# Patient Record
Sex: Male | Born: 1940 | Race: Black or African American | Hispanic: No | Marital: Married | Smoking: Former smoker
Health system: Southern US, Community
[De-identification: ages and names within clinical notes are randomized; demographics above are authoritative.]

## PROBLEM LIST (undated history)

## (undated) DIAGNOSIS — R590 Localized enlarged lymph nodes: Secondary | ICD-10-CM

## (undated) DIAGNOSIS — E785 Hyperlipidemia, unspecified: Secondary | ICD-10-CM

## (undated) DIAGNOSIS — D472 Monoclonal gammopathy: Secondary | ICD-10-CM

## (undated) DIAGNOSIS — M545 Low back pain, unspecified: Secondary | ICD-10-CM

## (undated) DIAGNOSIS — Z973 Presence of spectacles and contact lenses: Secondary | ICD-10-CM

## (undated) DIAGNOSIS — I10 Essential (primary) hypertension: Secondary | ICD-10-CM

## (undated) DIAGNOSIS — D649 Anemia, unspecified: Secondary | ICD-10-CM

## (undated) DIAGNOSIS — K5520 Angiodysplasia of colon without hemorrhage: Secondary | ICD-10-CM

## (undated) DIAGNOSIS — K219 Gastro-esophageal reflux disease without esophagitis: Secondary | ICD-10-CM

## (undated) DIAGNOSIS — M8700 Idiopathic aseptic necrosis of unspecified bone: Secondary | ICD-10-CM

## (undated) DIAGNOSIS — D573 Sickle-cell trait: Secondary | ICD-10-CM

## (undated) DIAGNOSIS — Z972 Presence of dental prosthetic device (complete) (partial): Secondary | ICD-10-CM

## (undated) DIAGNOSIS — R9389 Abnormal findings on diagnostic imaging of other specified body structures: Secondary | ICD-10-CM

## (undated) HISTORY — PX: COLONOSCOPY: SHX174

## (undated) HISTORY — DX: Anemia, unspecified: D64.9

## (undated) HISTORY — DX: Hyperlipidemia, unspecified: E78.5

## (undated) HISTORY — PX: HERNIA REPAIR: SHX51

## (undated) HISTORY — PX: APPENDECTOMY: SHX54

## (undated) HISTORY — DX: Monoclonal gammopathy: D47.2

## (undated) HISTORY — DX: Gastro-esophageal reflux disease without esophagitis: K21.9

## (undated) HISTORY — DX: Essential (primary) hypertension: I10

## (undated) HISTORY — DX: Localized enlarged lymph nodes: R59.0

---

## 2008-06-21 ENCOUNTER — Encounter: Payer: Self-pay | Admitting: Gastroenterology

## 2008-10-21 ENCOUNTER — Encounter: Payer: Self-pay | Admitting: Gastroenterology

## 2008-10-25 ENCOUNTER — Ambulatory Visit: Payer: Self-pay | Admitting: Gastroenterology

## 2008-10-25 DIAGNOSIS — D509 Iron deficiency anemia, unspecified: Secondary | ICD-10-CM

## 2008-10-26 ENCOUNTER — Ambulatory Visit: Payer: Self-pay | Admitting: Gastroenterology

## 2008-10-28 ENCOUNTER — Ambulatory Visit: Payer: Self-pay | Admitting: Gastroenterology

## 2008-11-04 ENCOUNTER — Encounter: Payer: Self-pay | Admitting: Gastroenterology

## 2008-11-04 ENCOUNTER — Ambulatory Visit: Payer: Self-pay | Admitting: Gastroenterology

## 2008-11-04 ENCOUNTER — Telehealth: Payer: Self-pay | Admitting: Gastroenterology

## 2008-11-05 ENCOUNTER — Telehealth: Payer: Self-pay | Admitting: Gastroenterology

## 2008-11-05 ENCOUNTER — Observation Stay (HOSPITAL_COMMUNITY): Admission: AD | Admit: 2008-11-05 | Discharge: 2008-11-05 | Payer: Self-pay | Admitting: Gastroenterology

## 2008-11-05 ENCOUNTER — Ambulatory Visit: Payer: Self-pay | Admitting: Hematology and Oncology

## 2008-11-05 LAB — CONVERTED CEMR LAB
HCT: 16.9 % — CL (ref 39.0–52.0)
Hemoglobin: 5 g/dL — CL (ref 13.0–17.0)
MCHC: 29.3 g/dL — ABNORMAL LOW (ref 30.0–36.0)
MCV: 61.1 fL — ABNORMAL LOW (ref 78.0–100.0)
Platelets: 323 10*3/uL (ref 150–400)
RBC: 2.77 M/uL — ABNORMAL LOW (ref 4.22–5.81)
RDW: 19.3 % — ABNORMAL HIGH (ref 11.5–14.6)
WBC: 3.7 10*3/uL — ABNORMAL LOW (ref 4.5–10.5)

## 2008-11-08 ENCOUNTER — Ambulatory Visit: Payer: Self-pay | Admitting: Gastroenterology

## 2008-11-08 LAB — CONVERTED CEMR LAB
Basophils Absolute: 0 10*3/uL (ref 0.0–0.1)
Basophils Relative: 0 % (ref 0.0–3.0)
Eosinophils Absolute: 0.2 10*3/uL (ref 0.0–0.7)
Eosinophils Relative: 3.5 % (ref 0.0–5.0)
HCT: 27.8 % — ABNORMAL LOW (ref 39.0–52.0)
Hemoglobin: 8.6 g/dL — ABNORMAL LOW (ref 13.0–17.0)
Lymphocytes Relative: 13 % (ref 12.0–46.0)
MCHC: 31.9 g/dL (ref 30.0–36.0)
MCV: 69.3 fL — ABNORMAL LOW (ref 78.0–100.0)
Monocytes Absolute: 0.8 10*3/uL (ref 0.1–1.0)
Monocytes Relative: 9.8 % (ref 3.0–12.0)
Neutro Abs: 3.8 10*3/uL (ref 1.4–7.7)
Neutrophils Relative %: 73.7 % (ref 43.0–77.0)
Platelets: 296 10*3/uL (ref 150–400)
RBC: 4.02 M/uL — ABNORMAL LOW (ref 4.22–5.81)
RDW: 30.6 % — ABNORMAL HIGH (ref 11.5–14.6)
WBC: 5.2 10*3/uL (ref 4.5–10.5)

## 2008-11-09 ENCOUNTER — Ambulatory Visit: Payer: Self-pay | Admitting: Gastroenterology

## 2008-11-09 LAB — CONVERTED CEMR LAB
Fecal Occult Blood: NEGATIVE
OCCULT 1: NEGATIVE
OCCULT 2: NEGATIVE
OCCULT 3: NEGATIVE
OCCULT 4: NEGATIVE
OCCULT 5: NEGATIVE

## 2008-11-10 ENCOUNTER — Telehealth: Payer: Self-pay | Admitting: Gastroenterology

## 2008-11-11 ENCOUNTER — Encounter: Payer: Self-pay | Admitting: Gastroenterology

## 2008-11-11 LAB — URINALYSIS, MICROSCOPIC - CHCC
Bilirubin (Urine): NEGATIVE
Blood: NEGATIVE
Ketones: NEGATIVE mg/dL
Specific Gravity, Urine: 1.015 (ref 1.003–1.035)
pH: 6 (ref 4.6–8.0)

## 2008-11-11 LAB — MANUAL DIFFERENTIAL
ALC: 0.7 10*3/uL — ABNORMAL LOW (ref 0.9–3.3)
Basophil: 1 % (ref 0–2)
EOS: 5 % (ref 0–7)
LYMPH: 14 % (ref 14–49)
MONO: 8 % (ref 0–14)

## 2008-11-11 LAB — CBC & DIFF AND RETIC
HGB: 8.5 g/dL — ABNORMAL LOW (ref 13.0–17.1)
IRF: 0.34 (ref 0.070–0.380)
Platelets: 246 10*3/uL (ref 140–400)
RBC: 4 10*6/uL — ABNORMAL LOW (ref 4.20–5.82)
RDW: 30.5 % — ABNORMAL HIGH (ref 11.0–14.6)
RETIC #: 72.4 10*3/uL (ref 31.8–103.9)
WBC: 5 10*3/uL (ref 4.0–10.3)

## 2008-11-15 LAB — IRON AND TIBC
Iron: <10 ug/dL — ABNORMAL LOW (ref 42–165)
UIBC: 429 ug/dL

## 2008-11-15 LAB — COMPREHENSIVE METABOLIC PANEL
BUN: 9 mg/dL (ref 6–23)
CO2: 22 mEq/L (ref 19–32)
Glucose, Bld: 80 mg/dL (ref 70–99)
Sodium: 141 mEq/L (ref 135–145)
Total Bilirubin: 0.6 mg/dL (ref 0.3–1.2)
Total Protein: 7.3 g/dL (ref 6.0–8.3)

## 2008-11-15 LAB — HEMOGLOBINOPATHY EVALUATION
Hgb A2 Quant: 2.7 % (ref 2.2–3.2)
Hgb F Quant: 0 % (ref 0.0–2.0)

## 2008-11-15 LAB — FERRITIN: Ferritin: 10 ng/mL — ABNORMAL LOW (ref 22–322)

## 2008-11-15 LAB — GLUCOSE 6 PHOSPHATE DEHYDROGENASE: G-6-PD, Quant: 27 U/g{Hb} — ABNORMAL HIGH (ref 7–20)

## 2008-11-15 LAB — PROTEIN ELECTROPHORESIS, SERUM, WITH REFLEX
Beta 2: 5.4 % (ref 3.2–6.5)
Beta Globulin: 7.3 % — ABNORMAL HIGH (ref 4.7–7.2)

## 2008-11-15 LAB — FECAL OCCULT BLOOD, GUAIAC: Occult Blood: NEGATIVE

## 2008-11-15 LAB — HAPTOGLOBIN: Haptoglobin: 120 mg/dL (ref 16–200)

## 2008-11-17 ENCOUNTER — Encounter (INDEPENDENT_AMBULATORY_CARE_PROVIDER_SITE_OTHER): Payer: Self-pay | Admitting: *Deleted

## 2008-11-17 ENCOUNTER — Encounter (INDEPENDENT_AMBULATORY_CARE_PROVIDER_SITE_OTHER): Payer: Self-pay | Admitting: Interventional Radiology

## 2008-11-17 ENCOUNTER — Ambulatory Visit (HOSPITAL_COMMUNITY): Admission: RE | Admit: 2008-11-17 | Discharge: 2008-11-17 | Payer: Self-pay | Admitting: Hematology and Oncology

## 2008-11-19 LAB — CBC WITH DIFFERENTIAL/PLATELET
BASO%: 0.7 % (ref 0.0–2.0)
EOS%: 3 % (ref 0.0–7.0)
HCT: 28.3 % — ABNORMAL LOW (ref 38.4–49.9)
MCH: 19.2 pg — ABNORMAL LOW (ref 27.2–33.4)
MCHC: 29.7 g/dL — ABNORMAL LOW (ref 32.0–36.0)
MONO#: 0.7 10*3/uL (ref 0.1–0.9)
RBC: 4.38 10*6/uL (ref 4.20–5.82)
RDW: 25.5 % — ABNORMAL HIGH (ref 11.0–14.6)
WBC: 6 10*3/uL (ref 4.0–10.3)
lymph#: 1 10*3/uL (ref 0.9–3.3)
nRBC: 0 % (ref 0–0)

## 2008-11-26 ENCOUNTER — Telehealth: Payer: Self-pay | Admitting: Gastroenterology

## 2008-11-26 ENCOUNTER — Encounter: Payer: Self-pay | Admitting: Gastroenterology

## 2008-11-26 LAB — CBC WITH DIFFERENTIAL/PLATELET
Basophils Absolute: 0 10*3/uL (ref 0.0–0.1)
Eosinophils Absolute: 0.2 10*3/uL (ref 0.0–0.5)
HGB: 10 g/dL — ABNORMAL LOW (ref 13.0–17.1)
MCV: 70.7 fL — ABNORMAL LOW (ref 79.3–98.0)
MONO%: 13.8 % (ref 0.0–14.0)
NEUT#: 4.5 10*3/uL (ref 1.5–6.5)
RBC: 4.71 10*6/uL (ref 4.20–5.82)
RDW: 32 % — ABNORMAL HIGH (ref 11.0–14.6)
WBC: 6.7 10*3/uL (ref 4.0–10.3)
lymph#: 1.1 10*3/uL (ref 0.9–3.3)
nRBC: 0 % (ref 0–0)

## 2008-12-01 ENCOUNTER — Ambulatory Visit: Payer: Self-pay | Admitting: Gastroenterology

## 2008-12-20 ENCOUNTER — Telehealth: Payer: Self-pay | Admitting: Gastroenterology

## 2008-12-22 ENCOUNTER — Telehealth: Payer: Self-pay | Admitting: Gastroenterology

## 2008-12-23 ENCOUNTER — Encounter: Payer: Self-pay | Admitting: Gastroenterology

## 2008-12-23 ENCOUNTER — Ambulatory Visit: Payer: Self-pay | Admitting: Gastroenterology

## 2008-12-23 DIAGNOSIS — Q2733 Arteriovenous malformation of digestive system vessel: Secondary | ICD-10-CM | POA: Insufficient documentation

## 2008-12-23 LAB — CONVERTED CEMR LAB
Basophils Absolute: 0.1 10*3/uL (ref 0.0–0.1)
Eosinophils Absolute: 0.1 10*3/uL (ref 0.0–0.7)
HCT: 39.2 % (ref 39.0–52.0)
Hemoglobin: 12.7 g/dL — ABNORMAL LOW (ref 13.0–17.0)
Iron: 52 ug/dL (ref 42–165)
Lymphs Abs: 0.9 10*3/uL (ref 0.7–4.0)
MCHC: 32.4 g/dL (ref 30.0–36.0)
Monocytes Absolute: 0.7 10*3/uL (ref 0.1–1.0)
Neutro Abs: 5.3 10*3/uL (ref 1.4–7.7)
Platelets: 147 10*3/uL — ABNORMAL LOW (ref 150.0–400.0)
RDW: 31.8 % — ABNORMAL HIGH (ref 11.5–14.6)
Saturation Ratios: 18.3 % — ABNORMAL LOW (ref 20.0–50.0)

## 2009-01-12 ENCOUNTER — Ambulatory Visit (HOSPITAL_COMMUNITY): Admission: RE | Admit: 2009-01-12 | Discharge: 2009-01-12 | Payer: Self-pay | Admitting: Gastroenterology

## 2009-01-12 ENCOUNTER — Ambulatory Visit: Payer: Self-pay | Admitting: Gastroenterology

## 2009-01-12 ENCOUNTER — Encounter: Payer: Self-pay | Admitting: Gastroenterology

## 2009-01-18 ENCOUNTER — Encounter: Payer: Self-pay | Admitting: Gastroenterology

## 2009-02-15 ENCOUNTER — Ambulatory Visit: Payer: Self-pay | Admitting: Gastroenterology

## 2009-02-15 LAB — CONVERTED CEMR LAB
Alkaline Phosphatase: 52 units/L (ref 39–117)
BUN: 9 mg/dL (ref 6–23)
Basophils Relative: 3.7 % — ABNORMAL HIGH (ref 0.0–3.0)
Bilirubin, Direct: 0.2 mg/dL (ref 0.0–0.3)
CO2: 29 meq/L (ref 19–32)
Chloride: 108 meq/L (ref 96–112)
Creatinine, Ser: 1.2 mg/dL (ref 0.4–1.5)
Eosinophils Absolute: 0.1 10*3/uL (ref 0.0–0.7)
Lymphs Abs: 1.3 10*3/uL (ref 0.7–4.0)
MCHC: 34.3 g/dL (ref 30.0–36.0)
MCV: 85.7 fL (ref 78.0–100.0)
Monocytes Absolute: 0.9 10*3/uL (ref 0.1–1.0)
Neutrophils Relative %: 61.3 % (ref 43.0–77.0)
Platelets: 187 10*3/uL (ref 150.0–400.0)
Total Bilirubin: 1 mg/dL (ref 0.3–1.2)

## 2009-02-17 ENCOUNTER — Ambulatory Visit: Payer: Self-pay | Admitting: Gastroenterology

## 2009-02-17 DIAGNOSIS — Z86010 Personal history of colonic polyps: Secondary | ICD-10-CM

## 2009-02-24 ENCOUNTER — Ambulatory Visit: Payer: Self-pay | Admitting: Hematology and Oncology

## 2009-02-28 LAB — CBC WITH DIFFERENTIAL/PLATELET
BASO%: 0.6 % (ref 0.0–2.0)
Eosinophils Absolute: 0.1 10*3/uL (ref 0.0–0.5)
LYMPH%: 26.5 % (ref 14.0–49.0)
MONO#: 0.9 10*3/uL (ref 0.1–0.9)
NEUT#: 2.7 10*3/uL (ref 1.5–6.5)
Platelets: 199 10*3/uL (ref 140–400)
RBC: 4.73 10*6/uL (ref 4.20–5.82)
RDW: 15.1 % — ABNORMAL HIGH (ref 11.0–14.6)
WBC: 5 10*3/uL (ref 4.0–10.3)
lymph#: 1.3 10*3/uL (ref 0.9–3.3)
nRBC: 0 % (ref 0–0)

## 2009-02-28 LAB — BASIC METABOLIC PANEL
CO2: 22 mEq/L (ref 19–32)
Chloride: 105 mEq/L (ref 96–112)
Potassium: 4 mEq/L (ref 3.5–5.3)
Sodium: 139 mEq/L (ref 135–145)

## 2009-02-28 LAB — IRON AND TIBC
%SAT: 13 % — ABNORMAL LOW (ref 20–55)
TIBC: 360 ug/dL (ref 215–435)
UIBC: 314 ug/dL

## 2009-02-28 LAB — FERRITIN: Ferritin: 27 ng/mL (ref 22–322)

## 2009-03-04 ENCOUNTER — Encounter: Payer: Self-pay | Admitting: Gastroenterology

## 2009-05-27 ENCOUNTER — Ambulatory Visit: Payer: Self-pay | Admitting: Hematology and Oncology

## 2009-05-31 LAB — CBC WITH DIFFERENTIAL/PLATELET
BASO%: 0.6 % (ref 0.0–2.0)
EOS%: 0.9 % (ref 0.0–7.0)
HCT: 32.6 % — ABNORMAL LOW (ref 38.4–49.9)
LYMPH%: 16.8 % (ref 14.0–49.0)
MCH: 23 pg — ABNORMAL LOW (ref 27.2–33.4)
MCHC: 32.8 g/dL (ref 32.0–36.0)
MONO%: 11.5 % (ref 0.0–14.0)
NEUT%: 70.2 % (ref 39.0–75.0)
Platelets: 181 10*3/uL (ref 140–400)
RBC: 4.66 10*6/uL (ref 4.20–5.82)
WBC: 5.3 10*3/uL (ref 4.0–10.3)
nRBC: 0 % (ref 0–0)

## 2009-05-31 LAB — IRON AND TIBC: TIBC: 395 ug/dL (ref 215–435)

## 2009-05-31 LAB — BASIC METABOLIC PANEL
BUN: 9 mg/dL (ref 6–23)
Calcium: 9.1 mg/dL (ref 8.4–10.5)
Creatinine, Ser: 1.22 mg/dL (ref 0.40–1.50)

## 2009-06-02 ENCOUNTER — Encounter: Payer: Self-pay | Admitting: Gastroenterology

## 2009-06-20 ENCOUNTER — Encounter: Payer: Self-pay | Admitting: Gastroenterology

## 2009-06-20 LAB — CBC WITH DIFFERENTIAL/PLATELET
Basophils Absolute: 0 10*3/uL (ref 0.0–0.1)
Eosinophils Absolute: 0.1 10*3/uL (ref 0.0–0.5)
HCT: 37.7 % — ABNORMAL LOW (ref 38.4–49.9)
HGB: 12.4 g/dL — ABNORMAL LOW (ref 13.0–17.1)
LYMPH%: 17.9 % (ref 14.0–49.0)
MCV: 74.2 fL — ABNORMAL LOW (ref 79.3–98.0)
MONO%: 13.6 % (ref 0.0–14.0)
NEUT#: 3.6 10*3/uL (ref 1.5–6.5)
Platelets: 188 10*3/uL (ref 140–400)
RDW: 23 % — ABNORMAL HIGH (ref 11.0–14.6)

## 2009-06-20 LAB — FECAL OCCULT BLOOD, GUAIAC

## 2009-06-21 LAB — COMPREHENSIVE METABOLIC PANEL
Albumin: 4.5 g/dL (ref 3.5–5.2)
Alkaline Phosphatase: 57 U/L (ref 39–117)
BUN: 15 mg/dL (ref 6–23)
Calcium: 8.8 mg/dL (ref 8.4–10.5)
Glucose, Bld: 101 mg/dL — ABNORMAL HIGH (ref 70–99)
Potassium: 4 mEq/L (ref 3.5–5.3)

## 2009-06-21 LAB — IRON AND TIBC
%SAT: 47 % (ref 20–55)
TIBC: 358 ug/dL (ref 215–435)
UIBC: 189 ug/dL

## 2009-07-13 ENCOUNTER — Ambulatory Visit: Payer: Self-pay | Admitting: Hematology and Oncology

## 2009-07-18 ENCOUNTER — Encounter: Payer: Self-pay | Admitting: Gastroenterology

## 2009-07-18 LAB — CBC WITH DIFFERENTIAL/PLATELET
BASO%: 0.3 % (ref 0.0–2.0)
EOS%: 1.9 % (ref 0.0–7.0)
HCT: 36 % — ABNORMAL LOW (ref 38.4–49.9)
MCH: 26.9 pg — ABNORMAL LOW (ref 27.2–33.4)
MCHC: 33.6 g/dL (ref 32.0–36.0)
MONO#: 0.8 10*3/uL (ref 0.1–0.9)
NEUT%: 64.7 % (ref 39.0–75.0)
RDW: 27.8 % — ABNORMAL HIGH (ref 11.0–14.6)
WBC: 4.7 10*3/uL (ref 4.0–10.3)
lymph#: 0.7 10*3/uL — ABNORMAL LOW (ref 0.9–3.3)

## 2009-07-18 LAB — LACTATE DEHYDROGENASE: LDH: 152 U/L (ref 94–250)

## 2009-07-18 LAB — COMPREHENSIVE METABOLIC PANEL
ALT: 39 U/L (ref 0–53)
AST: 31 U/L (ref 0–37)
Albumin: 4.5 g/dL (ref 3.5–5.2)
CO2: 23 mEq/L (ref 19–32)
Calcium: 9.3 mg/dL (ref 8.4–10.5)
Chloride: 104 mEq/L (ref 96–112)
Creatinine, Ser: 1.22 mg/dL (ref 0.40–1.50)
Potassium: 4.2 mEq/L (ref 3.5–5.3)
Sodium: 140 mEq/L (ref 135–145)
Total Protein: 7.5 g/dL (ref 6.0–8.3)

## 2009-07-20 LAB — TRANSFERRIN RECEPTOR, SOLUABLE: Transferrin Receptor, Soluble: 29.3 nmol/L

## 2009-07-20 LAB — IRON AND TIBC: TIBC: 306 ug/dL (ref 215–435)

## 2009-08-09 ENCOUNTER — Ambulatory Visit: Payer: Self-pay | Admitting: Hematology and Oncology

## 2009-08-15 LAB — COMPREHENSIVE METABOLIC PANEL
ALT: 23 U/L (ref 0–53)
AST: 22 U/L (ref 0–37)
Albumin: 4.4 g/dL (ref 3.5–5.2)
BUN: 9 mg/dL (ref 6–23)
CO2: 25 mEq/L (ref 19–32)
Calcium: 9.5 mg/dL (ref 8.4–10.5)
Chloride: 105 mEq/L (ref 96–112)
Potassium: 3.9 mEq/L (ref 3.5–5.3)

## 2009-08-15 LAB — CBC WITH DIFFERENTIAL/PLATELET
BASO%: 0.6 % (ref 0.0–2.0)
Eosinophils Absolute: 0.1 10*3/uL (ref 0.0–0.5)
HCT: 33.3 % — ABNORMAL LOW (ref 38.4–49.9)
MCHC: 33 g/dL (ref 32.0–36.0)
MONO#: 0.5 10*3/uL (ref 0.1–0.9)
NEUT#: 3.2 10*3/uL (ref 1.5–6.5)
NEUT%: 65.4 % (ref 39.0–75.0)
Platelets: 202 10*3/uL (ref 140–400)
WBC: 4.9 10*3/uL (ref 4.0–10.3)
lymph#: 1.1 10*3/uL (ref 0.9–3.3)
nRBC: 0 % (ref 0–0)

## 2009-08-15 LAB — LACTATE DEHYDROGENASE: LDH: 128 U/L (ref 94–250)

## 2009-08-16 ENCOUNTER — Encounter: Payer: Self-pay | Admitting: Gastroenterology

## 2009-09-06 LAB — CBC WITH DIFFERENTIAL/PLATELET
BASO%: 0.6 % (ref 0.0–2.0)
EOS%: 1.9 % (ref 0.0–7.0)
LYMPH%: 24.5 % (ref 14.0–49.0)
MCH: 29.6 pg (ref 27.2–33.4)
MCHC: 33.6 g/dL (ref 32.0–36.0)
MONO#: 0.6 10*3/uL (ref 0.1–0.9)
NEUT%: 60.3 % (ref 39.0–75.0)
Platelets: 173 10*3/uL (ref 140–400)
RBC: 4.5 10*6/uL (ref 4.20–5.82)
WBC: 4.7 10*3/uL (ref 4.0–10.3)
lymph#: 1.2 10*3/uL (ref 0.9–3.3)

## 2009-09-06 LAB — IRON AND TIBC
%SAT: 33 % (ref 20–55)
Iron: 97 ug/dL (ref 42–165)
UIBC: 198 ug/dL

## 2009-09-06 LAB — FERRITIN: Ferritin: 207 ng/mL (ref 22–322)

## 2009-09-22 ENCOUNTER — Ambulatory Visit: Payer: Self-pay | Admitting: Hematology and Oncology

## 2009-09-26 LAB — CBC WITH DIFFERENTIAL/PLATELET
BASO%: 0.7 % (ref 0.0–2.0)
Basophils Absolute: 0 10*3/uL (ref 0.0–0.1)
HCT: 40.4 % (ref 38.4–49.9)
HGB: 13.8 g/dL (ref 13.0–17.1)
MCHC: 34.2 g/dL (ref 32.0–36.0)
MONO#: 0.9 10*3/uL (ref 0.1–0.9)
NEUT%: 58.3 % (ref 39.0–75.0)
RDW: 13.9 % (ref 11.0–14.6)
WBC: 5.5 10*3/uL (ref 4.0–10.3)
lymph#: 1.3 10*3/uL (ref 0.9–3.3)

## 2009-09-26 LAB — IRON AND TIBC
%SAT: 42 % (ref 20–55)
Iron: 137 ug/dL (ref 42–165)
UIBC: 188 ug/dL

## 2009-09-26 LAB — BASIC METABOLIC PANEL
CO2: 24 mEq/L (ref 19–32)
Chloride: 105 mEq/L (ref 96–112)
Creatinine, Ser: 1.37 mg/dL (ref 0.40–1.50)
Potassium: 4.3 mEq/L (ref 3.5–5.3)

## 2009-09-28 ENCOUNTER — Encounter: Payer: Self-pay | Admitting: Gastroenterology

## 2009-11-18 ENCOUNTER — Ambulatory Visit: Payer: Self-pay | Admitting: Hematology and Oncology

## 2009-11-22 LAB — CBC WITH DIFFERENTIAL/PLATELET
BASO%: 0 % (ref 0.0–2.0)
EOS%: 0.9 % (ref 0.0–7.0)
HCT: 36 % — ABNORMAL LOW (ref 38.4–49.9)
MCH: 30.2 pg (ref 27.2–33.4)
MCHC: 33.5 g/dL (ref 32.0–36.0)
NEUT%: 77.5 % — ABNORMAL HIGH (ref 39.0–75.0)
RBC: 4 10*6/uL — ABNORMAL LOW (ref 4.20–5.82)
RDW: 13.1 % (ref 11.0–14.6)
lymph#: 0.8 10*3/uL — ABNORMAL LOW (ref 0.9–3.3)

## 2009-11-22 LAB — BASIC METABOLIC PANEL
Chloride: 101 mEq/L (ref 96–112)
Creatinine, Ser: 1.23 mg/dL (ref 0.40–1.50)

## 2009-11-22 LAB — IRON AND TIBC
Iron: 81 ug/dL (ref 42–165)
UIBC: 237 ug/dL

## 2009-11-25 ENCOUNTER — Encounter: Payer: Self-pay | Admitting: Gastroenterology

## 2009-12-04 IMAGING — CT CT BIOPSY
1 of 4 series · 14 of 32 positions shown, 19 images · non-contrast
Comparison: none

CLINICAL DATA: anemia

CT GUIDED DEEP ILIAC BONE ASPIRATION AND CORE BIOPSY:
TECHNIQUE: Patient was placed supine on the CT gantry and limited
axial scans through the pelvis were obtained. Appropriate skin
entry site was identified. Skin site was marked, prepped with
Betadine,   draped in usual sterile fashion, and infiltrated
locally with 1% lidocaine. Intravenous fentanyl and Versed were
administered as conscious sedation during continuous
cardiorespiratory monitoring by the radiology RN.

[Series 2: rtn ap without · axial · non-contrast · 0.74mm/px · z∈[+975,+1060]mm · 14 of 21 slices shown, 19 images]
[im 2/21  soft-tissue]
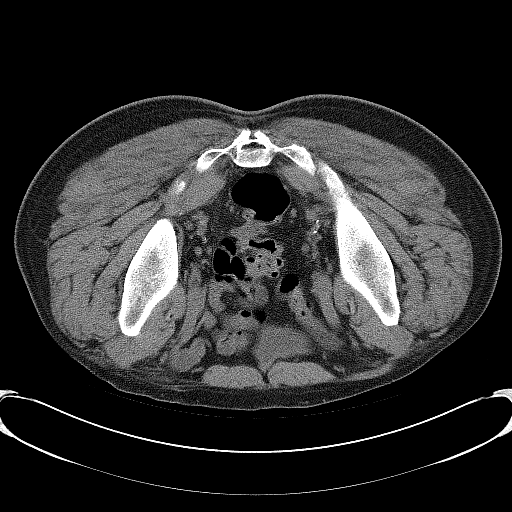
[im 2/21  bone]
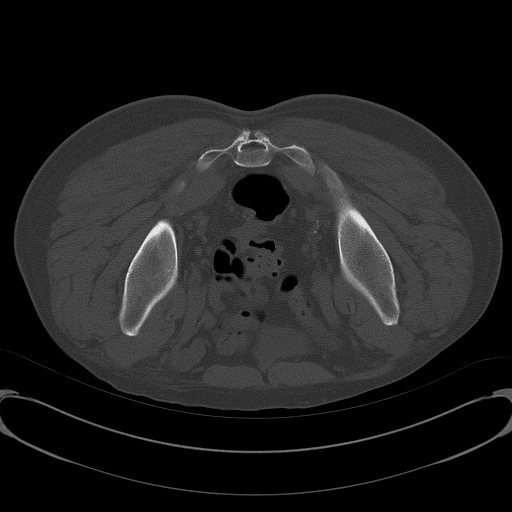
[im 3/21  soft-tissue]
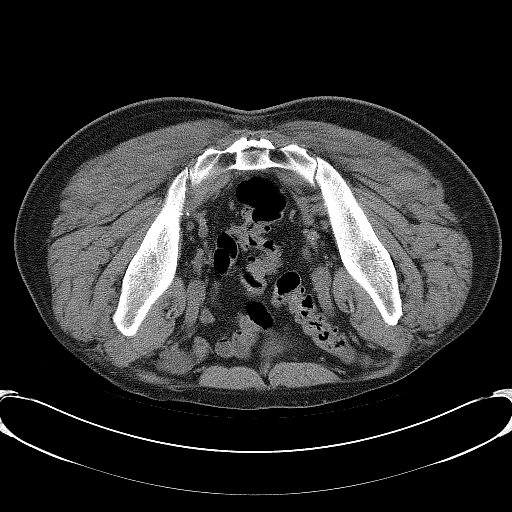
[im 5/21  soft-tissue]
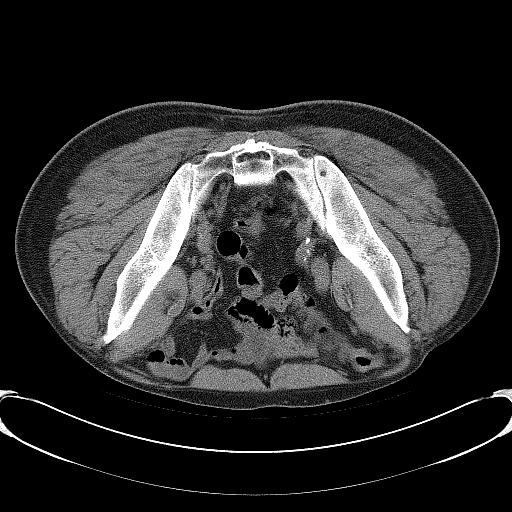
[im 6/21  soft-tissue]
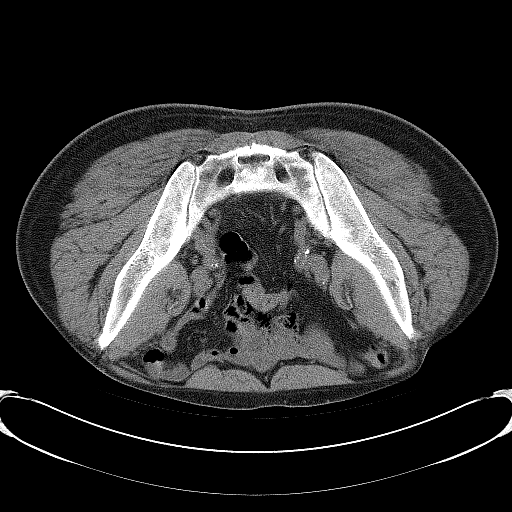
[im 8/21  soft-tissue]
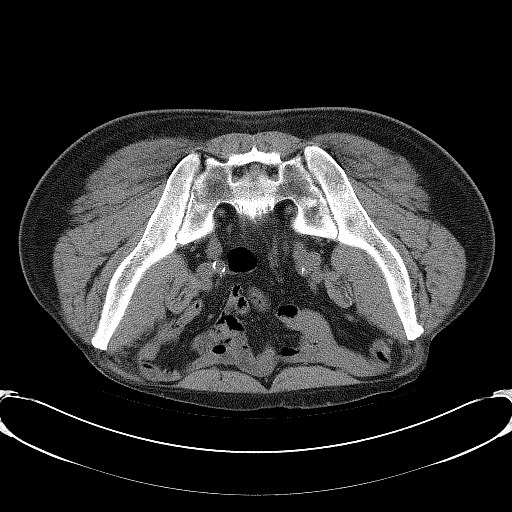
[im 9/21  soft-tissue]
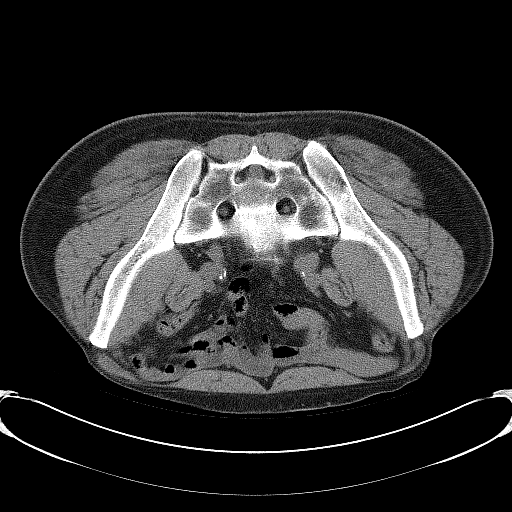
[im 11/21  soft-tissue]
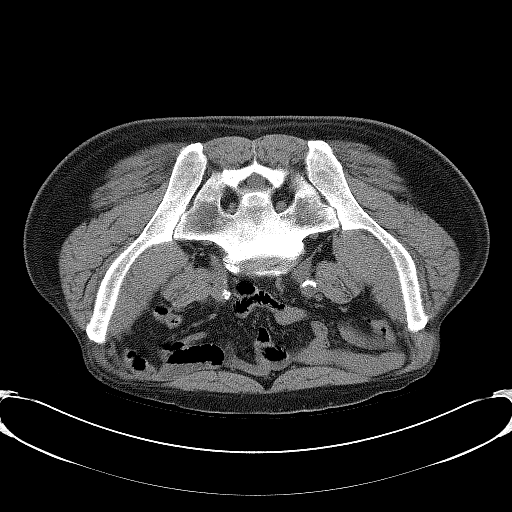
[im 12/21  soft-tissue]
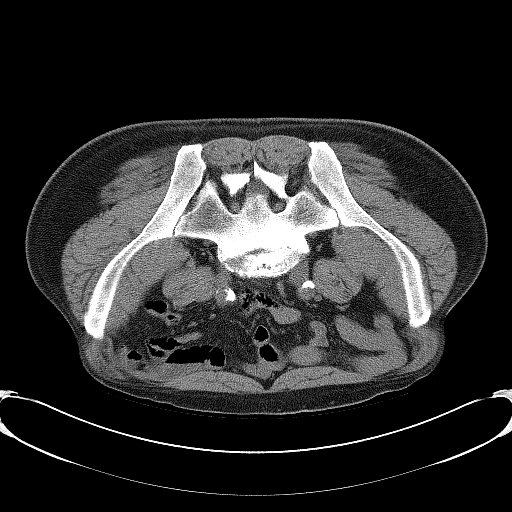
[im 13/21  soft-tissue]
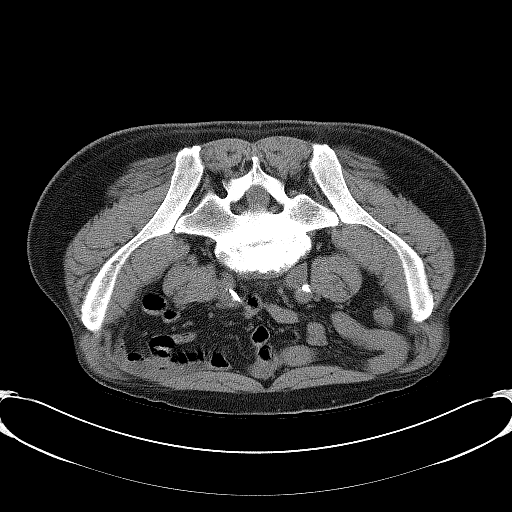
[im 13/21  bone]
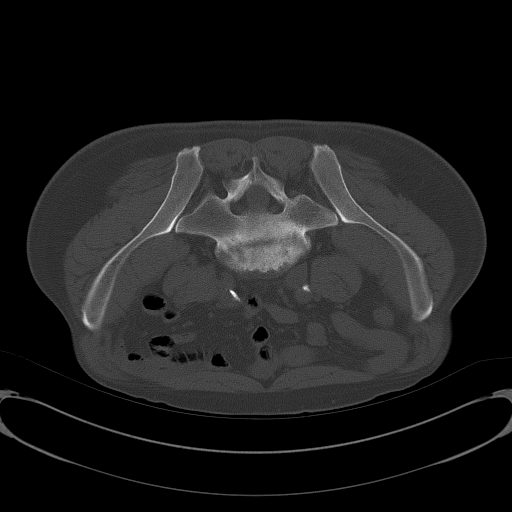
[im 15/21  soft-tissue]
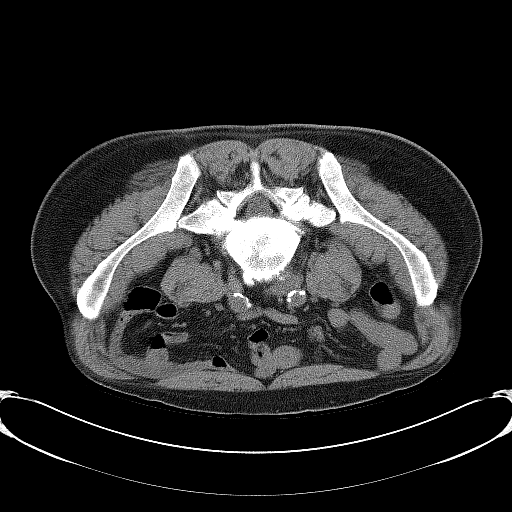
[im 16/21  soft-tissue]
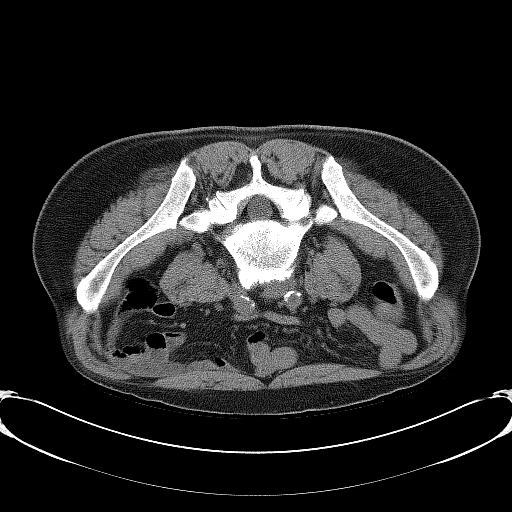
[im 16/21  lung]
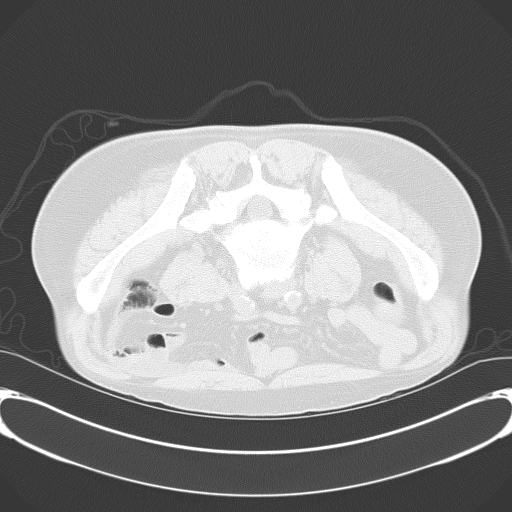
[im 17/21  lung]
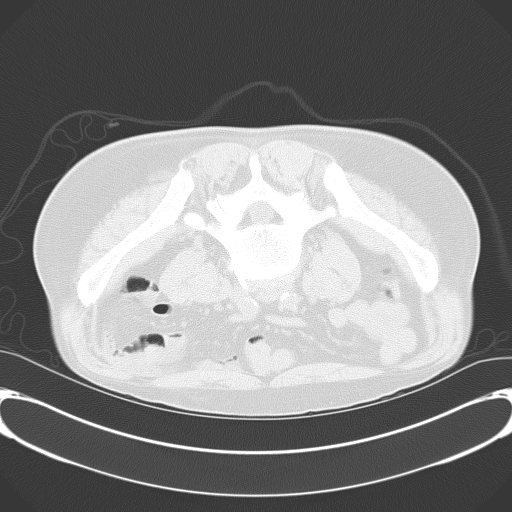
[im 18/21  soft-tissue]
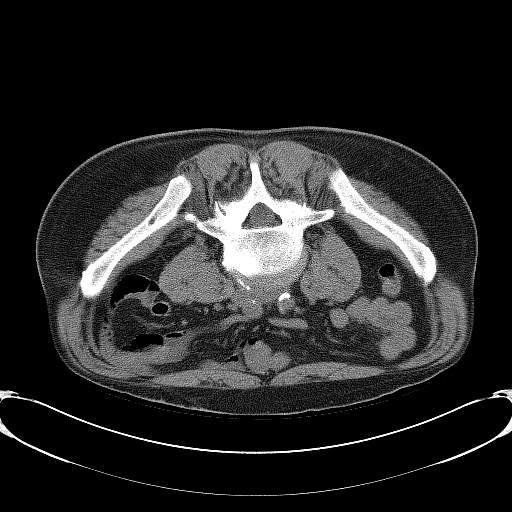
[im 18/21  lung]
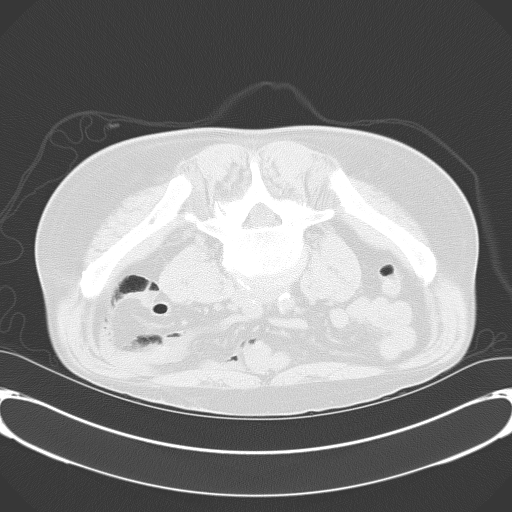
[im 19/21  soft-tissue]
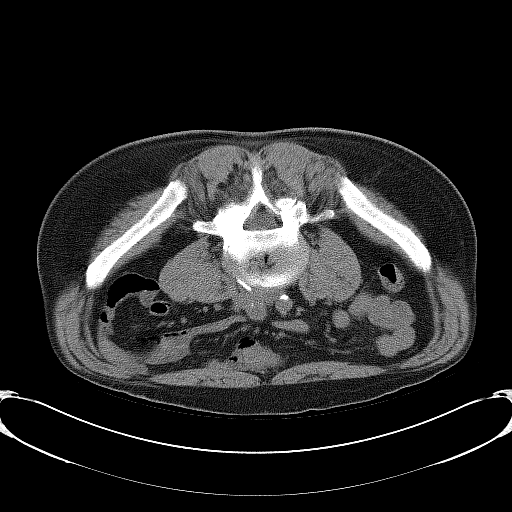
[im 19/21  lung]
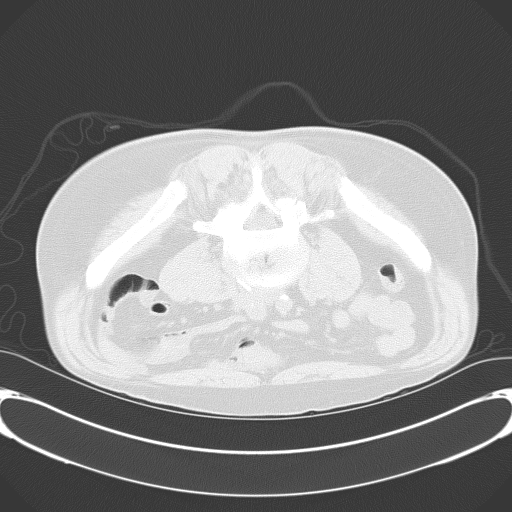

[14 of 32 positions shown; findings below may reference images not displayed]

Under CT fluoroscopic guidance an 11-gauge Cook trocar bone needle
was advanced into the  iliac bone   lateral to the sacroiliac
joint. Once needle tip position was confirmed, coaxial core and
aspiration samples were obtained. The final sample was obtained
using the guiding needle itself, which was then removed.
Postprocedure scans show no hematoma or fracture. Patient tolerated
procedure well, with no immediate complication.
IMPRESSION: 1. Technically successful CT guided  iliac bone core and aspiration
biopsy.

## 2010-01-25 ENCOUNTER — Ambulatory Visit: Payer: Self-pay | Admitting: Hematology and Oncology

## 2010-01-25 LAB — CBC WITH DIFFERENTIAL/PLATELET
BASO%: 0.2 % (ref 0.0–2.0)
Basophils Absolute: 0 10*3/uL (ref 0.0–0.1)
EOS%: 0.9 % (ref 0.0–7.0)
HCT: 36 % — ABNORMAL LOW (ref 38.4–49.9)
HGB: 12.2 g/dL — ABNORMAL LOW (ref 13.0–17.1)
MCH: 29.4 pg (ref 27.2–33.4)
MCHC: 33.8 g/dL (ref 32.0–36.0)
MCV: 87 fL (ref 79.3–98.0)
MONO%: 12.7 % (ref 0.0–14.0)
NEUT%: 75.8 % — ABNORMAL HIGH (ref 39.0–75.0)
lymph#: 0.6 10*3/uL — ABNORMAL LOW (ref 0.9–3.3)

## 2010-01-25 LAB — FOLATE: Folate: >20 ng/mL

## 2010-01-25 LAB — COMPREHENSIVE METABOLIC PANEL
ALT: 30 U/L (ref 0–53)
AST: 25 U/L (ref 0–37)
Alkaline Phosphatase: 52 U/L (ref 39–117)
BUN: 13 mg/dL (ref 6–23)
Chloride: 103 mEq/L (ref 96–112)
Creatinine, Ser: 1.43 mg/dL (ref 0.40–1.50)
Total Bilirubin: 0.6 mg/dL (ref 0.3–1.2)

## 2010-01-25 LAB — IRON AND TIBC
%SAT: 17 % — ABNORMAL LOW (ref 20–55)
TIBC: 303 ug/dL (ref 215–435)

## 2010-01-25 LAB — FERRITIN: Ferritin: 102 ng/mL (ref 22–322)

## 2010-01-25 LAB — VITAMIN B12: Vitamin B-12: 413 pg/mL (ref 211–911)

## 2010-02-01 ENCOUNTER — Encounter: Payer: Self-pay | Admitting: Gastroenterology

## 2010-02-01 LAB — HEMOSIDERIN, URINE

## 2010-04-28 ENCOUNTER — Ambulatory Visit: Payer: Self-pay | Admitting: Hematology and Oncology

## 2010-05-03 LAB — BASIC METABOLIC PANEL
BUN: 13 mg/dL (ref 6–23)
Calcium: 9.2 mg/dL (ref 8.4–10.5)
Glucose, Bld: 99 mg/dL (ref 70–99)
Potassium: 4.1 mEq/L (ref 3.5–5.3)
Sodium: 141 mEq/L (ref 135–145)

## 2010-05-03 LAB — CBC WITH DIFFERENTIAL/PLATELET
EOS%: 1.7 % (ref 0.0–7.0)
LYMPH%: 16.2 % (ref 14.0–49.0)
MCH: 28.6 pg (ref 27.2–33.4)
MCV: 83.5 fL (ref 79.3–98.0)
MONO%: 14.5 % — ABNORMAL HIGH (ref 0.0–14.0)
Platelets: 186 10*3/uL (ref 140–400)
RBC: 4.37 10*6/uL (ref 4.20–5.82)
RDW: 14.9 % — ABNORMAL HIGH (ref 11.0–14.6)

## 2010-05-03 LAB — IRON AND TIBC
%SAT: 28 % (ref 20–55)
Iron: 93 ug/dL (ref 42–165)
UIBC: 243 ug/dL

## 2010-05-03 LAB — FERRITIN: Ferritin: 28 ng/mL (ref 22–322)

## 2010-05-05 ENCOUNTER — Encounter: Payer: Self-pay | Admitting: Gastroenterology

## 2010-09-21 ENCOUNTER — Ambulatory Visit: Payer: Self-pay | Admitting: Hematology and Oncology

## 2010-09-25 LAB — CBC WITH DIFFERENTIAL/PLATELET
BASO%: 0.1 % (ref 0.0–2.0)
Basophils Absolute: 0 10*3/uL (ref 0.0–0.1)
EOS%: 1.1 % (ref 0.0–7.0)
Eosinophils Absolute: 0.1 10*3/uL (ref 0.0–0.5)
HCT: 40.4 % (ref 38.4–49.9)
HGB: 13.3 g/dL (ref 13.0–17.1)
LYMPH%: 8.6 % — ABNORMAL LOW (ref 14.0–49.0)
MCH: 28.7 pg (ref 27.2–33.4)
MCHC: 32.8 g/dL (ref 32.0–36.0)
MCV: 87.4 fL (ref 79.3–98.0)
MONO#: 0.8 10*3/uL (ref 0.1–0.9)
MONO%: 12.4 % (ref 0.0–14.0)
NEUT#: 5 10*3/uL (ref 1.5–6.5)
NEUT%: 77.8 % — ABNORMAL HIGH (ref 39.0–75.0)
Platelets: 207 10*3/uL (ref 140–400)
RBC: 4.62 10*6/uL (ref 4.20–5.82)
RDW: 14.1 % (ref 11.0–14.6)
WBC: 6.4 10*3/uL (ref 4.0–10.3)
lymph#: 0.6 10*3/uL — ABNORMAL LOW (ref 0.9–3.3)

## 2010-09-27 LAB — SPEP & IFE WITH QIG
Albumin ELP: 53.6 % — ABNORMAL LOW (ref 55.8–66.1)
Alpha-1-Globulin: 5.5 % — ABNORMAL HIGH (ref 2.9–4.9)
Alpha-2-Globulin: 11.1 % (ref 7.1–11.8)
Beta 2: 7.2 % — ABNORMAL HIGH (ref 3.2–6.5)
Beta Globulin: 5.5 % (ref 4.7–7.2)
Gamma Globulin: 17.1 % (ref 11.1–18.8)
IgA: 320 mg/dL (ref 68–378)
IgG (Immunoglobin G), Serum: 1460 mg/dL (ref 694–1618)
IgM, Serum: 75 mg/dL (ref 60–263)
Total Protein, Serum Electrophoresis: 7.6 g/dL (ref 6.0–8.3)

## 2010-09-27 LAB — FERRITIN: Ferritin: 46 ng/mL (ref 22–322)

## 2010-09-27 LAB — BASIC METABOLIC PANEL
BUN: 8 mg/dL (ref 6–23)
CO2: 27 mEq/L (ref 19–32)
Calcium: 10.2 mg/dL (ref 8.4–10.5)
Chloride: 104 mEq/L (ref 96–112)
Creatinine, Ser: 1.17 mg/dL (ref 0.40–1.50)
Glucose, Bld: 90 mg/dL (ref 70–99)
Potassium: 4.4 mEq/L (ref 3.5–5.3)
Sodium: 140 mEq/L (ref 135–145)

## 2010-09-27 LAB — IRON AND TIBC
%SAT: 6 % — ABNORMAL LOW (ref 20–55)
Iron: 20 ug/dL — ABNORMAL LOW (ref 42–165)
TIBC: 314 ug/dL (ref 215–435)
UIBC: 294 ug/dL

## 2010-09-27 LAB — VITAMIN B12: Vitamin B-12: 495 pg/mL (ref 211–911)

## 2010-10-04 ENCOUNTER — Encounter: Payer: Self-pay | Admitting: Gastroenterology

## 2010-10-17 NOTE — Letter (Signed)
[facility]   Imported By: Sherian Rein 12/09/2009 08:10:45  _____________________________________________________________________  External Attachment:    Type:   Image     Comment:   External Document

## 2010-10-17 NOTE — Letter (Signed)
Summary: [facility]  [facility]   Imported By: Lennie Odor 06/02/2010 15:53:38  _____________________________________________________________________  External Attachment:    Type:   Image     Comment:   External Document

## 2010-10-17 NOTE — Letter (Signed)
[facility]   Imported By: Lester Aurora 10/15/2009 09:26:40  _____________________________________________________________________  External Attachment:    Type:   Image     Comment:   External Document

## 2010-10-17 NOTE — Letter (Signed)
[facility]   Imported By: Sherian Rein 02/28/2010 15:19:32  _____________________________________________________________________  External Attachment:    Type:   Image     Comment:   External Document

## 2010-11-08 NOTE — Letter (Signed)
Summary: [facility]  [facility]   Imported By: Sherian Rein 11/02/2010 14:40:46  _____________________________________________________________________  External Attachment:    Type:   Image     Comment:   External Document

## 2010-12-28 LAB — PROTIME-INR: Prothrombin Time: 12.6 seconds (ref 11.6–15.2)

## 2010-12-28 LAB — CBC
MCHC: 30 g/dL (ref 30.0–36.0)
MCV: 66.8 fL — ABNORMAL LOW (ref 78.0–100.0)
Platelets: 286 10*3/uL (ref 150–400)
WBC: 4.7 10*3/uL (ref 4.0–10.5)

## 2011-01-02 LAB — CBC
MCHC: 28.1 g/dL — ABNORMAL LOW (ref 30.0–36.0)
Platelets: 280 10*3/uL (ref 150–400)
RBC: 2.8 MIL/uL — ABNORMAL LOW (ref 4.22–5.81)
RDW: 19.9 % — ABNORMAL HIGH (ref 11.5–15.5)

## 2011-01-02 LAB — CROSSMATCH
ABO/RH(D): O POS
Antibody Screen: NEGATIVE

## 2011-01-02 LAB — IRON AND TIBC

## 2011-01-02 LAB — ABO/RH: ABO/RH(D): O POS

## 2011-01-30 ENCOUNTER — Encounter (HOSPITAL_BASED_OUTPATIENT_CLINIC_OR_DEPARTMENT_OTHER): Payer: Medicare Other | Admitting: Hematology and Oncology

## 2011-01-30 ENCOUNTER — Other Ambulatory Visit: Payer: Self-pay | Admitting: Hematology and Oncology

## 2011-01-30 DIAGNOSIS — D472 Monoclonal gammopathy: Secondary | ICD-10-CM

## 2011-01-30 LAB — COMPREHENSIVE METABOLIC PANEL
ALT: 21 U/L (ref 0–53)
AST: 20 U/L (ref 0–37)
CO2: 22 mEq/L (ref 19–32)
Calcium: 9.5 mg/dL (ref 8.4–10.5)
Chloride: 106 mEq/L (ref 96–112)
Potassium: 4.2 mEq/L (ref 3.5–5.3)
Sodium: 142 mEq/L (ref 135–145)
Total Protein: 6.7 g/dL (ref 6.0–8.3)

## 2011-01-30 LAB — CBC WITH DIFFERENTIAL/PLATELET
BASO%: 0 % (ref 0.0–2.0)
HCT: 33.2 % — ABNORMAL LOW (ref 38.4–49.9)
MCHC: 32.8 g/dL (ref 32.0–36.0)
MONO#: 0.9 10*3/uL (ref 0.1–0.9)
RBC: 4.2 10*6/uL (ref 4.20–5.82)
RDW: 18.2 % — ABNORMAL HIGH (ref 11.0–14.6)
WBC: 5.7 10*3/uL (ref 4.0–10.3)
lymph#: 0.6 10*3/uL — ABNORMAL LOW (ref 0.9–3.3)

## 2011-01-30 LAB — FERRITIN: Ferritin: 18 ng/mL — ABNORMAL LOW (ref 22–322)

## 2011-02-02 ENCOUNTER — Other Ambulatory Visit: Payer: Self-pay | Admitting: Hematology and Oncology

## 2011-02-02 ENCOUNTER — Encounter (HOSPITAL_BASED_OUTPATIENT_CLINIC_OR_DEPARTMENT_OTHER): Payer: Medicare Other | Admitting: Hematology and Oncology

## 2011-02-02 DIAGNOSIS — D573 Sickle-cell trait: Secondary | ICD-10-CM

## 2011-02-02 DIAGNOSIS — D509 Iron deficiency anemia, unspecified: Secondary | ICD-10-CM

## 2011-02-02 DIAGNOSIS — D472 Monoclonal gammopathy: Secondary | ICD-10-CM

## 2011-02-02 DIAGNOSIS — K5520 Angiodysplasia of colon without hemorrhage: Secondary | ICD-10-CM

## 2011-02-02 DIAGNOSIS — D649 Anemia, unspecified: Secondary | ICD-10-CM

## 2011-02-05 ENCOUNTER — Encounter (HOSPITAL_BASED_OUTPATIENT_CLINIC_OR_DEPARTMENT_OTHER): Payer: Medicare Other | Admitting: Hematology and Oncology

## 2011-02-05 DIAGNOSIS — D509 Iron deficiency anemia, unspecified: Secondary | ICD-10-CM

## 2011-06-01 ENCOUNTER — Inpatient Hospital Stay (HOSPITAL_COMMUNITY)
Admission: AD | Admit: 2011-06-01 | Discharge: 2011-06-03 | DRG: 812 | Disposition: A | Discharge status: Home / Self Care | Payer: Medicare Other | Source: Ambulatory Visit | Attending: Internal Medicine | Admitting: Internal Medicine

## 2011-06-01 DIAGNOSIS — D6489 Other specified anemias: Principal | ICD-10-CM | POA: Diagnosis present

## 2011-06-01 DIAGNOSIS — E785 Hyperlipidemia, unspecified: Secondary | ICD-10-CM | POA: Diagnosis present

## 2011-06-01 DIAGNOSIS — I10 Essential (primary) hypertension: Secondary | ICD-10-CM | POA: Diagnosis present

## 2011-06-01 DIAGNOSIS — K5520 Angiodysplasia of colon without hemorrhage: Secondary | ICD-10-CM | POA: Diagnosis present

## 2011-06-01 DIAGNOSIS — M109 Gout, unspecified: Secondary | ICD-10-CM | POA: Diagnosis present

## 2011-06-01 LAB — CBC
Hemoglobin: 5.4 g/dL — CL (ref 13.0–17.0)
MCH: 17.8 pg — ABNORMAL LOW (ref 26.0–34.0)
MCV: 62 fL — ABNORMAL LOW (ref 78.0–100.0)
RBC: 3.03 MIL/uL — ABNORMAL LOW (ref 4.22–5.81)

## 2011-06-01 LAB — COMPREHENSIVE METABOLIC PANEL
AST: 13 U/L (ref 0–37)
Albumin: 3.5 g/dL (ref 3.5–5.2)
Calcium: 8.5 mg/dL (ref 8.4–10.5)
Chloride: 103 mEq/L (ref 96–112)
Creatinine, Ser: 1.04 mg/dL (ref 0.50–1.35)
Total Bilirubin: 0.3 mg/dL (ref 0.3–1.2)
Total Protein: 6.6 g/dL (ref 6.0–8.3)

## 2011-06-01 LAB — CARDIAC PANEL(CRET KIN+CKTOT+MB+TROPI): CK, MB: 1.5 ng/mL (ref 0.3–4.0)

## 2011-06-02 LAB — FERRITIN: Ferritin: 8 ng/mL — ABNORMAL LOW (ref 22–322)

## 2011-06-02 LAB — BASIC METABOLIC PANEL
Calcium: 8.2 mg/dL — ABNORMAL LOW (ref 8.4–10.5)
GFR calc Af Amer: >60 mL/min (ref >60)
GFR calc non Af Amer: >60 mL/min (ref >60)
Sodium: 136 mEq/L (ref 135–145)

## 2011-06-02 LAB — PREPARE RBC (CROSSMATCH)

## 2011-06-02 LAB — PRO B NATRIURETIC PEPTIDE: Pro B Natriuretic peptide (BNP): 211.9 pg/mL — ABNORMAL HIGH (ref 0–125)

## 2011-06-02 LAB — CBC
MCH: 19.7 pg — ABNORMAL LOW (ref 26.0–34.0)
MCHC: 30 g/dL (ref 30.0–36.0)
Platelets: 251 10*3/uL (ref 150–400)

## 2011-06-02 LAB — IRON AND TIBC
Iron: <10 ug/dL — ABNORMAL LOW (ref 42–135)
UIBC: 350 ug/dL (ref 125–400)

## 2011-06-02 LAB — HEMOGLOBIN AND HEMATOCRIT, BLOOD
HCT: 30.1 % — ABNORMAL LOW (ref 39.0–52.0)
Hemoglobin: 9.6 g/dL — ABNORMAL LOW (ref 13.0–17.0)

## 2011-06-02 LAB — VITAMIN B12: Vitamin B-12: 475 pg/mL (ref 211–911)

## 2011-06-02 LAB — FOLATE: Folate: >20 ng/mL

## 2011-06-03 LAB — CBC
Hemoglobin: 9.2 g/dL — ABNORMAL LOW (ref 13.0–17.0)
MCH: 21.7 pg — ABNORMAL LOW (ref 26.0–34.0)
MCHC: 30.8 g/dL (ref 30.0–36.0)
Platelets: 246 10*3/uL (ref 150–400)

## 2011-06-03 LAB — CROSSMATCH
ABO/RH(D): O POS
Unit division: 0

## 2011-06-03 LAB — PRO B NATRIURETIC PEPTIDE: Pro B Natriuretic peptide (BNP): 362.5 pg/mL — ABNORMAL HIGH (ref 0–125)

## 2011-06-03 LAB — BASIC METABOLIC PANEL
BUN: 6 mg/dL (ref 6–23)
Calcium: 8 mg/dL — ABNORMAL LOW (ref 8.4–10.5)
GFR calc non Af Amer: >60 mL/min (ref >60)
Glucose, Bld: 84 mg/dL (ref 70–99)

## 2011-06-19 NOTE — H&P (Signed)
NAMEFELICIA, BOTH NO.:  [id number]  MEDICAL RECORD NO.:  [id number]  LOCATION:  1417                         FACILITY:  [facility]  PHYSICIAN:  Gaspar Garbe, M.D.DATE OF BIRTH:  1940-12-05  DATE OF ADMISSION:  06/01/2011 DATE OF DISCHARGE:                             HISTORY & PHYSICAL   CHIEF COMPLAINT:  Dyspnea on exertion.  HISTORY OF PRESENT ILLNESS:  The patient is a 70 year old African American male, who has a history of anemia with AVMs, who is normally followed by Dr. Dalene Carrow for this.  He has had hospitalization and further GI workup by Dr. Arlyce Dice.  He came in today after calling for an office visit in the afternoon and was seen in our office late today.  He indicates that over the past couple of weeks, he has noticed some shortness of breath with ambulation and indicates that this is progressed gradually, but over the course of the past month, indicates that he gets easily winded even with a short flight of climbing stairs. Denies any chest pain at this point.  He has had emergently abnormal cardiac workup for which he was lost to follow up by his cardiologist. He had an echocardiogram done in May, which showed mild LVH and EF of 55% and saw Dr. Herbie Baltimore, for this as well.  He has been on atenolol 25 mg and a baby aspirin, which he is not currently taking regularly.  MEDICATIONS: 1. Lipitor 20 mg daily. 2. Multivitamin once daily. 3. Calcium 600 mg twice daily. 4. Cinnamon tablets 1 tablet once daily. 5. Prilosec 20 mg p.o. daily. 6. Atenolol 25 mg daily. 7. Ferrex 150 mg once daily. 8. Allopurinol 100 mg once daily. 9. Colcrys 0.6 mg as needed for gout flares.  ALLERGIES:  No known drug allergies.  PAST MEDICAL HISTORY: 1. 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 3. Bone marrow biopsy in 2010. 4. Gout with first flare of September 25, 2010. 5. Mild LVH. 6. Mild abnormal cardiac testing, lost to follow up per Dr.  Herbie Baltimore.  PAST SURGICAL HISTORY:  Right inguinal hernia and appendectomy.  SOCIAL HISTORY:  The patient is married with 1 child.  He is retired from [employer].  FAMILY HISTORY:  Father is alive.  Mother is deceased, passed away during childbirth.  REVIEW OF SYSTEMS:  The patient indicates some dyspnea on exertion, but no shortness of breath.  He indicates generalized fatigue.  No night sweats.  He is not having any photophobia, fevers, or neck pain.  He denies any abdominal pains or soreness.  Review of systems is, otherwise, negative.  PHYSICAL EXAMINATION:  VITAL SIGNS:  Blood pressure 124/60, saturating 99% on room air, heart rate 72, respiratory rate 18. GENERAL:  No acute distress. HEENT:  Normocephalic, atraumatic.  PERRLA.  EOMI.  ENT is within normal limits. NECK:  Supple.  No lymphadenopathy, JVD, or bruit. HEART:  Regular rate and rhythm.  No murmur or gallop. LUNGS:  Clear to auscultation bilaterally. ABDOMEN:  Soft, nontender, normoactive bowel sounds.  The patient is guaiac negative. NEURO:  The patient is oriented to person, place, and time.  No neurologic deficits noted.  LABORATORY TESTING:  Initial CBC done in our office shows a hemoglobin of 5.6.  ASSESSMENT/PLAN: 1. Anemia, requiring transfusion.  Mr. Dung has a history of     arteriovenous malformation and last had a colonoscopy slightly less     than a year ago by Dr. Arlyce Dice, with a thorough workup due to his     anemia.  He was last seen by Dr. Dalene Carrow per my notes on Feb 02, 2011.  Some time in the interval of this, he has become     increasingly more anemic.  He has received INFeD through her office     as well.  He had underwent a bone marrow biopsy on November 25, 2010,     which showed decreased iron stores, but was, otherwise,     unremarkable.  Considering this amount of workup, I do not feel     other workup is necessary as this is most likely a long course of     bleeding, he will receive  transfusions in the hospital in order to     stabilize him and once he is stabilized, will return home.  We will     notify Dr. Dalene Carrow of these followings to arrange for short-term     followup and further INFeD most likely needed to be given at his     office.  We will check an iron panel. 2. Mildly abnormal cardiac testing.  Patient has been arranged for     followup with Dr. Herbie Baltimore through her office visit today prior to     the CBC coming back.  He was [facility] directly to [facility] this evening. 3. Gout.  He is not currently having any flares and is not on any     aspirin products or other sources of bleeding, nor as he is taking     Colcrys for a flare in the past 3 months. 4. Hyperlipidemia.  We will maintain on Lipitor.     Gaspar Garbe, M.D.     RWT/MEDQ  D:  06/01/2011  T:  06/02/2011  Job:  [id number]  cc:   Barbette Hair. Arlyce Dice, MD,FACG [address] [place] [address]  Laurice Record, M.D. Fax: [phone]  Electronically Signed by Guerry Bruin M.D. on 06/19/2011 10:49:25 PM

## 2011-06-19 NOTE — Discharge Summary (Signed)
[name], [name] NO.:  [id number]  MEDICAL RECORD NO.:  [id number]  LOCATION:  1417                         FACILITY:  [facility]  PHYSICIAN:  Gaspar Garbe, M.D.DATE OF BIRTH:  24-Feb-1941  DATE OF ADMISSION:  06/01/2011 DATE OF DISCHARGE:  06/03/2011                              DISCHARGE SUMMARY   FINAL DIAGNOSES: 1. Anemia, requiring transfusion. 2. History of chronic arteriovenous malformations. 3. Hypertension. 4. Hyperlipidemia. 5. Gout.  MEDICATIONS ON DISCHARGE: 1. Iron complex 1 tablet by mouth daily 150 mg. 2. Allopurinol 100 mg p.o. daily. 3. Atenolol 25 mg daily. 4. Atorvastatin 20 mg every evening. 5. Multivitamin once daily. 6. Omeprazole 20 mg once every evening.  The patient is to stop any aspirin or aspirin containing compounds.  PHYSICAL EXAMINATION:  VITAL SIGNS:  On date of discharge, temperature 98.4, pulse 66, respiratory rate 16, blood pressure 114/69. GENERAL:  No acute distress. HEENT:  Normocephalic, atraumatic.  PERRLA.  EOMI.  ENT is within normal limits. NECK:  Supple.  No lymphadenopathy, JVD, or bruit. HEART:  Regular rate and rhythm.  No murmur, rub, or gallop. LUNGS:  Clear to auscultation bilaterally. ABDOMEN:  Soft, nontender, normoactive bowel sounds. EXTREMITIES:  No clubbing, cyanosis, or edema. NEURO:  The patient is oriented to person, place, and time, otherwise intact.  LABORATORY TESTING:  BNP slightly elevated at 362.5.  CBC shows a hemoglobin of 9.2, status post 4 units of packed red blood cells. Initial hemoglobin in the office was 5.3 and 5.4 here at the hospital. Anemia panel showed an iron less than 10.  His MCV was decreased at 70. He had a normal B12 and folate levels.  HOSPITAL COURSE:  [name] was seen in our office later on Friday because of complaints of dyspnea on exertion, over the course of the past month which has progressively gotten worse.  He noted no change in his bowel  movements from his usual state.  Of note, he had a history of AVM and anemia which was worked up by Dr. Arlyce Dice in 2010.  The patient had been placed on PPI at that point and has been followed by Dr. Dalene Carrow periodically at the cancer center with his hemoglobin being in the 5, so he was directly admitted from the office at the end of the day and given 2 units of packed red blood cells with his hemoglobin rising to 7 and with 2 units making his hemoglobin rising to 9.  He had no worsening shortness of breath.  He is guaiac negative during his stay.  He is being discharged good condition.  FOLLOWUP:  The patient is to follow up with Dr. Wylene Simmer on Thursday with my nurse, Fransisco Beau, will call him Monday morning to arrange for followup. In addition, we have mentioned following up with Dr. Arlyce Dice as an outpatient and either myself or Dr. Dalene Carrow handling his hemoglobin/hematocrit which probably need to be watched on a monthly basis.  At this point, we will discuss this further at his office on Thursday.  CONDITION ON DISCHARGE:  Stable.  The above was discussed with the patient and his wife who is amendable to take him home and watch  him very closely.     Gaspar Garbe, M.D.     RWT/MEDQ  D:  06/03/2011  T:  06/03/2011  Job:  [id number]  cc:   Barbette Hair. Arlyce Dice, MD,FACG [address] [place] [address]  Laurice Record, M.D. Fax: [phone]  Electronically Signed by Guerry Bruin M.D. on 06/19/2011 10:49:19 PM

## 2011-06-20 ENCOUNTER — Other Ambulatory Visit: Payer: Self-pay | Admitting: Hematology and Oncology

## 2011-06-20 ENCOUNTER — Encounter (HOSPITAL_BASED_OUTPATIENT_CLINIC_OR_DEPARTMENT_OTHER): Payer: Medicare Other | Admitting: Hematology and Oncology

## 2011-06-20 DIAGNOSIS — D573 Sickle-cell trait: Secondary | ICD-10-CM

## 2011-06-20 DIAGNOSIS — D649 Anemia, unspecified: Secondary | ICD-10-CM

## 2011-06-20 DIAGNOSIS — D472 Monoclonal gammopathy: Secondary | ICD-10-CM

## 2011-06-20 DIAGNOSIS — K5520 Angiodysplasia of colon without hemorrhage: Secondary | ICD-10-CM

## 2011-06-20 DIAGNOSIS — D509 Iron deficiency anemia, unspecified: Secondary | ICD-10-CM

## 2011-06-20 LAB — CBC WITH DIFFERENTIAL/PLATELET
BASO%: 0.6 % (ref 0.0–2.0)
EOS%: 2.7 % (ref 0.0–7.0)
HCT: 37.2 % — ABNORMAL LOW (ref 38.4–49.9)
MCH: 21.8 pg — ABNORMAL LOW (ref 27.2–33.4)
MCHC: 31.5 g/dL — ABNORMAL LOW (ref 32.0–36.0)
MONO%: 15.2 % — ABNORMAL HIGH (ref 0.0–14.0)
NEUT%: 62.8 % (ref 39.0–75.0)
lymph#: 1.2 10*3/uL (ref 0.9–3.3)

## 2011-06-22 ENCOUNTER — Encounter (HOSPITAL_BASED_OUTPATIENT_CLINIC_OR_DEPARTMENT_OTHER): Payer: Medicare Other | Admitting: Hematology and Oncology

## 2011-06-22 DIAGNOSIS — G729 Myopathy, unspecified: Secondary | ICD-10-CM

## 2011-06-22 DIAGNOSIS — D509 Iron deficiency anemia, unspecified: Secondary | ICD-10-CM

## 2011-06-22 LAB — COMPREHENSIVE METABOLIC PANEL
ALT: 19 U/L (ref 0–53)
AST: 22 U/L (ref 0–37)
Albumin: 4.4 g/dL (ref 3.5–5.2)
Alkaline Phosphatase: 44 U/L (ref 39–117)
Glucose, Bld: 85 mg/dL (ref 70–99)
Potassium: 4 mEq/L (ref 3.5–5.3)
Sodium: 141 mEq/L (ref 135–145)
Total Bilirubin: 0.6 mg/dL (ref 0.3–1.2)
Total Protein: 7.1 g/dL (ref 6.0–8.3)

## 2011-06-22 LAB — SPEP & IFE WITH QIG
Beta 2: 5.8 % (ref 3.2–6.5)
Beta Globulin: 6.1 % (ref 4.7–7.2)
IgA: 307 mg/dL (ref 68–379)
IgG (Immunoglobin G), Serum: 1390 mg/dL (ref 650–1600)
M-Spike, %: 0.42 g/dL
Total Protein, Serum Electrophoresis: 7.1 g/dL (ref 6.0–8.3)

## 2011-06-22 LAB — IRON AND TIBC: %SAT: 18 % — ABNORMAL LOW (ref 20–55)

## 2011-06-22 LAB — FERRITIN: Ferritin: 18 ng/mL — ABNORMAL LOW (ref 22–322)

## 2011-06-27 ENCOUNTER — Ambulatory Visit (INDEPENDENT_AMBULATORY_CARE_PROVIDER_SITE_OTHER): Payer: Medicare Other | Admitting: Gastroenterology

## 2011-06-27 ENCOUNTER — Encounter: Payer: Self-pay | Admitting: Gastroenterology

## 2011-06-27 VITALS — BP 132/68 | HR 60 | Ht 74.0 in | Wt 209.0 lb

## 2011-06-27 DIAGNOSIS — D509 Iron deficiency anemia, unspecified: Secondary | ICD-10-CM

## 2011-06-27 MED ORDER — PEG-KCL-NACL-NASULF-NA ASC-C 100 G PO SOLR: 1.0000 | Freq: Once | ORAL | Status: DC

## 2011-06-27 NOTE — Patient Instructions (Signed)
Your Colonoscopy is scheduled on 07/12/2011 at 12:30pm at [facility]   Colonoscopy A colonoscopy is an exam to evaluate your entire colon. In this exam, your colon is cleansed. A long fiberoptic tube is inserted through your rectum and into your colon. The fiberoptic scope (endoscope) is a long bundle of enclosed and very flexible fibers. These fibers transmit light to the area examined and send images from that area to your caregiver. Discomfort is usually minimal. You may be given a drug to help you sleep (sedative) during or prior to the procedure. This exam helps to detect lumps (tumors), polyps, inflammation, and areas of bleeding. Your caregiver may also take a small piece of tissue (biopsy) that will be examined under a microscope. BEFORE THE PROCEDURE  A clear liquid diet may be required for 2 days before the exam.   Liquid injections (enemas) or laxatives may be required.   A large amount of electrolyte solution may be given to you to drink over a short period of time. This solution is used to clean out your colon.   You should be present 1 prior to your procedure or as directed by your caregiver.   Check in at the admissions desk to fill out necessary forms if not preregistered. There will be consent forms to sign prior to the procedure. If accompanied by friends or family, there is a waiting area for them while you are having your procedure.  LET YOUR CAREGIVER KNOW ABOUT:  Allergies to food or medicine.  Medicines taken, including vitamins, herbs, eyedrops, over-the-counter medicines, and creams.   Use of steroids (by mouth or creams).   Previous problems with anesthetics or numbing medicines.   History of bleeding problems or blood clots.  Previous surgery.   Other health problems, including diabetes and kidney problems.   Possibility of pregnancy, if this applies.   AFTER THE PROCEDURE  If you received a sedative and/or pain medicine, you will need to arrange  for someone to drive you home.   Occasionally, there is a little blood passed with the first bowel movement. DO NOT be concerned.  HOME CARE INSTRUCTIONS  It is not unusual to pass moderate amounts of gas and experience mild abdominal cramping following the procedure. This is due to air being used to inflate your colon during the exam. Walking or a warm pack on your belly (abdomen) may help.   You may resume all normal meals and activities after sedatives and medicines have worn off.   Only take over-the-counter or prescription medicines for pain, discomfort, or fever as directed by your caregiver. DO NOT use aspirin or blood thinners if a biopsy was taken. Consult your caregiver for medicine usage if biopsies were taken.  FINDING OUT THE RESULTS OF YOUR TEST Not all test results are available during your visit. If your test results are not back during the visit, make an appointment with your caregiver to find out the results. Do not assume everything is normal if you have not heard from your caregiver or the medical facility. It is important for you to follow up on all of your test results. SEEK IMMEDIATE MEDICAL CARE IF:  You have an oral temperature above 100, not controlled by medicine.   You pass large blood clots or fill a toilet with blood following the procedure. This may also occur 10 to 14 days following the procedure. This is more likely if a biopsy was taken.   You develop abdominal pain that keeps getting  worse and cannot be relieved with medicine.  Document Released: 08/31/2000 Document Re-Released: 11/28/2009 [facility] Patient Information 2011 [place], [place].

## 2011-06-27 NOTE — Progress Notes (Signed)
[name] is a 70 year old African. American male known to me from previous workup for an iron deficiency anemia, status post laser fulguration of AVMs, and history of hypertension and gout,  here following brief hospitalization for anemia. Severe microcytic anemia was noted last month for which he was hospitalized for a blood transfusion. He has no GI complaints including any change of bowel habits, abdominal pain, melena or hematochezia.  Upper endoscopy in February, 2010 was normal.   Colonoscopy  that same month demonstrated a sessile polyp in the cecum in the transverse colon and mild diverticulosis. Capsule study demonstrate a small AVM in the small bowel and large AVM in the cecum or proximal descending colon. At repeat colonoscopy in April, 2010 there were at least 3 AVMs or in the cecum that were cauterized with the argon plasma coagulator;  a second colon polyp was also seen and removed.       Review of Systems: Pertinent positive and negative review of systems were noted in the above HPI section. All other review of systems were otherwise negative.    Current Medications, Allergies, Past Medical History, Past Surgical History, Family History and Social History were reviewed in [facility] electronic medical record  Vital signs were reviewed in today's medical record. Physical Exam: General: Well developed , well nourished, no acute distress Head: Normocephalic and atraumatic Eyes:  sclerae anicteric, EOMI Ears: Normal auditory acuity Mouth: No deformity or lesions Lungs: Clear throughout to auscultation Heart: Regular rate and rhythm; no murmurs, rubs or bruits Abdomen: Soft, non tender and non distended. No masses, hepatosplenomegaly or hernias noted. Normal Bowel sounds Rectal:deferred Musculoskeletal: Symmetrical with no gross deformities  Pulses:  Normal pulses noted Extremities: No clubbing, cyanosis, edema or deformities noted Neurological: Alert oriented x 4, grossly  nonfocal Psychological:  Alert and cooperative. Normal mood and affect

## 2011-06-27 NOTE — Assessment & Plan Note (Addendum)
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  Recommendations #1 repeat colonoscopy with fulguration of any AVMs  #2 patient should stay on iron supplementation and have his blood counts checked on a regular basis

## 2011-07-12 ENCOUNTER — Other Ambulatory Visit: Payer: Medicare Other | Admitting: Gastroenterology

## 2011-07-12 ENCOUNTER — Ambulatory Visit (HOSPITAL_COMMUNITY)
Admission: RE | Admit: 2011-07-12 | Discharge: 2011-07-12 | Disposition: A | Discharge status: Home / Self Care | Payer: Medicare Other | Source: Ambulatory Visit | Attending: Gastroenterology | Admitting: Gastroenterology

## 2011-07-12 DIAGNOSIS — K219 Gastro-esophageal reflux disease without esophagitis: Secondary | ICD-10-CM | POA: Insufficient documentation

## 2011-07-12 DIAGNOSIS — D509 Iron deficiency anemia, unspecified: Secondary | ICD-10-CM | POA: Insufficient documentation

## 2011-07-12 DIAGNOSIS — K5520 Angiodysplasia of colon without hemorrhage: Secondary | ICD-10-CM | POA: Insufficient documentation

## 2011-07-12 DIAGNOSIS — K5730 Diverticulosis of large intestine without perforation or abscess without bleeding: Secondary | ICD-10-CM | POA: Insufficient documentation

## 2011-07-12 DIAGNOSIS — K5521 Angiodysplasia of colon with hemorrhage: Secondary | ICD-10-CM

## 2011-07-12 DIAGNOSIS — Z860100 Personal history of colon polyps, unspecified: Secondary | ICD-10-CM | POA: Insufficient documentation

## 2011-07-16 ENCOUNTER — Other Ambulatory Visit (HOSPITAL_COMMUNITY): Payer: Medicare Other

## 2011-08-02 ENCOUNTER — Telehealth: Payer: Self-pay

## 2011-08-02 ENCOUNTER — Other Ambulatory Visit: Payer: Self-pay | Admitting: Gastroenterology

## 2011-08-02 DIAGNOSIS — R58 Hemorrhage, not elsewhere classified: Secondary | ICD-10-CM

## 2011-08-02 NOTE — Telephone Encounter (Signed)
Pt scheduled for EGD/Enteroscopy @[facility]  on 08/24/11 pt to arrive at 11am, appt at 12:30pm. Pt aware of appt date and time. Pt mailed prep instructions. Scheduled with Noreene Larsson [phone].

## 2011-08-23 ENCOUNTER — Encounter (HOSPITAL_COMMUNITY): Payer: Self-pay | Admitting: *Deleted

## 2011-08-24 ENCOUNTER — Encounter (HOSPITAL_COMMUNITY)
Admission: RE | Disposition: A | Discharge status: Home / Self Care | Payer: Self-pay | Source: Ambulatory Visit | Attending: Gastroenterology

## 2011-08-24 ENCOUNTER — Encounter (HOSPITAL_COMMUNITY): Payer: Self-pay | Admitting: *Deleted

## 2011-08-24 ENCOUNTER — Encounter (HOSPITAL_COMMUNITY): Payer: Self-pay | Admitting: Gastroenterology

## 2011-08-24 ENCOUNTER — Ambulatory Visit (HOSPITAL_COMMUNITY)
Admission: RE | Admit: 2011-08-24 | Discharge: 2011-08-24 | Disposition: A | Discharge status: Home / Self Care | Payer: Medicare Other | Source: Ambulatory Visit | Attending: Gastroenterology | Admitting: Gastroenterology

## 2011-08-24 ENCOUNTER — Ambulatory Visit (HOSPITAL_COMMUNITY): Payer: Medicare Other | Admitting: *Deleted

## 2011-08-24 DIAGNOSIS — K31819 Angiodysplasia of stomach and duodenum without bleeding: Secondary | ICD-10-CM | POA: Insufficient documentation

## 2011-08-24 DIAGNOSIS — K2970 Gastritis, unspecified, without bleeding: Secondary | ICD-10-CM | POA: Insufficient documentation

## 2011-08-24 DIAGNOSIS — K5520 Angiodysplasia of colon without hemorrhage: Secondary | ICD-10-CM

## 2011-08-24 DIAGNOSIS — K2990 Gastroduodenitis, unspecified, without bleeding: Secondary | ICD-10-CM | POA: Insufficient documentation

## 2011-08-24 DIAGNOSIS — Q2733 Arteriovenous malformation of digestive system vessel: Secondary | ICD-10-CM

## 2011-08-24 DIAGNOSIS — D509 Iron deficiency anemia, unspecified: Secondary | ICD-10-CM

## 2011-08-24 HISTORY — PX: ENTEROSCOPY: SHX5533

## 2011-08-24 SURGERY — ENTEROSCOPY
Anesthesia: Monitor Anesthesia Care

## 2011-08-24 MED ORDER — SODIUM CHLORIDE 0.9 % IV SOLN
Freq: Once | INTRAVENOUS | Status: AC
  Administered 2011-08-24: 1000 mL via INTRAVENOUS

## 2011-08-24 MED ORDER — PROPOFOL 10 MG/ML IV EMUL
INTRAVENOUS | Status: DC | PRN
  Administered 2011-08-24: 140 ug/kg/min via INTRAVENOUS

## 2011-08-24 MED ORDER — FENTANYL CITRATE 0.05 MG/ML IJ SOLN
INTRAMUSCULAR | Status: DC | PRN
  Administered 2011-08-24: 25 ug via INTRAVENOUS
  Administered 2011-08-24: 50 ug via INTRAVENOUS
  Administered 2011-08-24: 25 ug via INTRAVENOUS

## 2011-08-24 MED ORDER — MIDAZOLAM HCL 5 MG/5ML IJ SOLN
INTRAMUSCULAR | Status: DC | PRN
  Administered 2011-08-24 (×2): 1 mg via INTRAVENOUS

## 2011-08-24 NOTE — Op Note (Signed)
Endoscopy report is contained in the [facility] report.  

## 2011-08-24 NOTE — Transfer of Care (Signed)
Immediate Anesthesia Transfer of Care Note  Patient: [name]  Procedure(s) Performed:  ENTEROSCOPY  Patient Location: PACU  Anesthesia Type: MAC  Level of Consciousness: awake and alert   Airway & Oxygen Therapy: Patient Spontanous Breathing and Patient connected to nasal cannula oxygen  Post-op Assessment: Report given to PACU RN and Post -op Vital signs reviewed and stable  Post vital signs: Reviewed and stable  Complications: No apparent anesthesia complications

## 2011-08-24 NOTE — Anesthesia Postprocedure Evaluation (Signed)
  Anesthesia Post-op Note  Patient: [name]  Procedure(s) Performed:  ENTEROSCOPY  Patient Location: PACU  Anesthesia Type: MAC  Level of Consciousness: awake and alert   Airway and Oxygen Therapy: Patient Spontanous Breathing  Post-op Pain: mild  Post-op Assessment: Post-op Vital signs reviewed, Patient's Cardiovascular Status Stable, Respiratory Function Stable, Patent Airway and No signs of Nausea or vomiting  Post-op Vital Signs: stable  Complications: No apparent anesthesia complications

## 2011-08-24 NOTE — H&P (Signed)
[name]   06/27/2011 1:45 PM Office Visit  MRN: [id number]   Description: 70 year old male  Provider: Melvia Heaps, MD  Department: [facility]        Diagnoses     Iron deficiency anemia, unspecified   - Primary    280.9      Reason for Visit     Follow-up    Hosp f/u for iron def anemia. Patient denies any GI complaints         Vitals - Last Recorded       BP Pulse Ht Wt BMI    132/68  60  6\' 2"  (1.88 m)  94.802 kg (209 lb)  26.83 kg/m2       Progress Notes     Melvia Heaps, MD  06/28/2011  2:21 PM  Signed [name] is a 70 year old African. American male known to me from previous workup for an iron deficiency anemia, status post laser fulguration of AVMs, and history of hypertension and gout,  here following brief hospitalization for anemia. Severe microcytic anemia was noted last month for which he was hospitalized for a blood transfusion. He has no GI complaints including any change of bowel habits, abdominal pain, melena or hematochezia.  Upper endoscopy in February, 2010 was normal.   Colonoscopy  that same month demonstrated a sessile polyp in the cecum in the transverse colon and mild diverticulosis. Capsule study demonstrate a small AVM in the small bowel and large AVM in the cecum or proximal descending colon. At repeat colonoscopy in April, 2010 there were at least 3 AVMs or in the cecum that were cauterized with the argon plasma coagulator;  a second colon polyp was also seen and removed.           Review of Systems: Pertinent positive and negative review of systems were noted in the above HPI section. All other review of systems were otherwise negative.       Current Medications, Allergies, Past Medical History, Past Surgical History, Family History and Social History were reviewed in [employer] electronic medical record   Vital signs were reviewed in today's medical record. Physical Exam: General: Well developed , well  nourished, no acute distress Head: Normocephalic and atraumatic Eyes:  sclerae anicteric, EOMI Ears: Normal auditory acuity Mouth: No deformity or lesions Lungs: Clear throughout to auscultation Heart: Regular rate and rhythm; no murmurs, rubs or bruits Abdomen: Soft, non tender and non distended. No masses, hepatosplenomegaly or hernias noted. Normal Bowel sounds Rectal:deferred Musculoskeletal: Symmetrical with no gross deformities   Pulses:  Normal pulses noted Extremities: No clubbing, cyanosis, edema or deformities noted Neurological: Alert oriented x 4, grossly nonfocal Psychological:  Alert and cooperative. 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   Recommendations #1 repeat colonoscopy with fulguration of any AVMs  #2 patient should stay on iron supplementation and have his blood counts checked on a regular basis  Previous Version      Scan on 07/04/2011 2:03 PM by Provider DefaultScan on 07/04/2011 2:03 PM by Provider Default        Not recorded    Transcription         Imaging by Provider Default  Document Information        Medications Ordered This Encounter         Disp Refills Start End    peg 3350 powder (MOVIPREP) 100 G SOLR 1 kit 0 06/27/2011      Take 1 kit (100 g total) by mouth once. - Oral       Discontinued Medications         Reason for Discontinue    calcium carbonate (OS-CAL) 600 MG TABS      Cholecalciferol (VITAMIN D3) 1000 UNITS CAPS      Cinnamon 500 MG TABS      digoxin (LANOXIN) 0.25 MG tablet      lansoprazole (PREVACID SOLUTAB) 30 MG disintegrating tablet         Orders Placed This Encounter     Ambulatory referral to Gastroenterology [EAV40 Custom]         Patient Instructions     Your Colonoscopy is scheduled on 07/12/2011 at 12:30pm at  [facility]    Colonoscopy  A colonoscopy is an exam to evaluate your entire colon. In this exam, your colon is cleansed. A long fiberoptic tube is inserted through your rectum and into your colon. The fiberoptic scope (endoscope) is a long bundle of enclosed and very flexible fibers. These fibers transmit light to the area examined and send images from that area to your caregiver. Discomfort is usually minimal. You may be given a drug to help you sleep (sedative) during or prior to the procedure. This exam helps to detect lumps (tumors), polyps, inflammation, and areas of bleeding. Your caregiver may also take a small piece of tissue (biopsy) that will be examined under a microscope. BEFORE THE PROCEDURE A clear liquid diet may be required for 2 days before the exam.   Liquid injections (enemas) or laxatives may be required.   A large amount of electrolyte solution may be given to you to drink over a short period of time. This solution is used to clean out your colon.   You should be present 1 prior to your procedure or as directed by your caregiver.   Check in at the admissions desk to fill out necessary forms if not preregistered. There will be consent forms to sign prior to the procedure. If accompanied by friends or family, there is a waiting area for them while you are having your procedure.  LET YOUR CAREGIVER KNOW ABOUT: Allergies to food or medicine. Medicines taken, including vitamins, herbs, eyedrops, over-the-counter medicines, and creams.   Use of steroids (by mouth or creams).   Previous problems with anesthetics or numbing medicines.    History of bleeding problems or blood clots. Previous surgery.   Other health problems, including diabetes and kidney problems.   Possibility of pregnancy, if this applies.      AFTER THE PROCEDURE If you received a sedative and/or pain medicine, you will need to arrange for someone to drive you home.   Occasionally, there is a little  blood passed with the first bowel movement. DO NOT be concerned.  HOME CARE INSTRUCTIONS It is not unusual to pass moderate amounts of gas and experience mild abdominal cramping following the procedure. This is due to air being used to inflate your colon during the exam. Walking or a warm pack on your belly (abdomen) may help.   You may resume all normal meals and activities after sedatives and medicines have worn off.   Only take over-the-counter or prescription medicines for pain,  discomfort, or fever as directed by your caregiver. DO NOT use aspirin or blood thinners if a biopsy was taken. Consult your caregiver for medicine usage if biopsies were taken.  FINDING OUT THE RESULTS OF YOUR TEST Not all test results are available during your visit. If your test results are not back during the visit, make an appointment with your caregiver to find out the results. Do not assume everything is normal if you have not heard from your caregiver or the medical facility. It is important for you to follow up on all of your test results. SEEK IMMEDIATE MEDICAL CARE IF: You have an oral temperature above 100, not controlled by medicine.   You pass large blood clots or fill a toilet with blood following the procedure. This may also occur 10 to 14 days following the procedure. This is more likely if a biopsy was taken.   You develop abdominal pain that keeps getting worse and cannot be relieved with medicine.  Document Released: 08/31/2000 Document Re-Released: 11/28/2009 [facility] Patient Information 2011 [place], [place].       Level of Service     PR OFFICE/OUTPT VISIT,EST,LEVL IV [id number]         All Flowsheet Templates (all recorded)     Encounter Vitals Flowsheet    Custom Formula Data Flowsheet    Anthropometrics Flowsheet                   Letters     Letter Information         Status    Louis Meckel on 06/27/2011 Sent           Referring Provider     Lauretta I. Odogwu, MD        All Charges for This Encounter       Code Description Service Date Service Provider Modifiers Quantity    99214 PR OFFICE/OUTPT VISIT,EST,LEVL IV 06/27/2011 Louis Meckel, MD   1      Routing History       Recipient Method User Date Routed To    [facility] [place], [place] [[id number]] 06/27/2011 (none on file)    [address] [place] [address] Phone: [phone] Letter: created on 06/27/2011 by Louis Meckel, MD              Other Encounter Related Information     Allergies & Medications         Problem List         History         Patient-Entered Questionnaires     No data filed         The recent H&P (dated *06/27/11**) was reviewed, the patient was examined and there is no change in the patients condition since that H&P was completed.   Melvia Heaps  08/24/2011, 12:26 PM

## 2011-08-24 NOTE — Op Note (Signed)
[facility] [address] [place], [place]  [address]  OPERATIVE PROCEDURE REPORT  PATIENT:  [name], [name]  MR#:  [id number] BIRTHDATE:  1940/09/19, 70 yrs. old  GENDER:  male  ENDOSCOPIST:  Barbette Hair. Arlyce Dice, MD ASSISTANT:  PROCEDURE DATE:  08/24/2011 PROCEDURE:  Small bowel endoscopy with Laser Ablation of AVMs ASA CLASS:  Class II INDICATIONS:  1) iron deficiency anemia  MEDICATIONS:   MAC sedation, administered by CRNA TOPICAL ANESTHETIC:  DESCRIPTION OF PROCEDURE:   After the risks benefits and alternatives of the procedure were thoroughly explained, informed consent was obtained.  The  endoscope was introduced through the mouth and advanced to the proximal jejunum, without limitations. The instrument was slowly withdrawn as the mucosa was fully examined. 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 Single nonbleeding AVM - fulgurated with the APC  Gastritis was found in the body and the antrum of the stomach (see image1). Mild, diffuse erythema with erosions otherwise normal exam (see image6, image3, and image10). Retroflexed views revealed no abnormalities.    The scope was then withdrawn from the patient and the procedure terminated.  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 Arlyce Dice, MD  CC:  Guerry Bruin, MD  n. Rosalie Doctor:   Barbette Hair. Kaplan at 08/24/2011 01:14 PM  Katina Dung, [id number]

## 2011-08-24 NOTE — Anesthesia Preprocedure Evaluation (Signed)
Anesthesia Evaluation  Patient identified by MRN, date of birth, ID band Patient awake    Reviewed: Allergy & Precautions, H&P , NPO status , Patient's Chart, lab work & pertinent test results  Airway Mallampati: II TM Distance: >3 FB Neck ROM: Full    Dental No notable dental hx. (+) Partial Lower and Partial Upper   Pulmonary neg pulmonary ROS,  clear to auscultation  Pulmonary exam normal       Cardiovascular hypertension, Pt. on medications neg cardio ROS Regular Normal    Neuro/Psych Negative Neurological ROS  Negative Psych ROS   GI/Hepatic negative GI ROS, Neg liver ROS, GERD-  ,  Endo/Other  Negative Endocrine ROS  Renal/GU negative Renal ROS  Genitourinary negative   Musculoskeletal negative musculoskeletal ROS (+)   Abdominal   Peds negative pediatric ROS (+)  Hematology negative hematology ROS (+)   Anesthesia Other Findings   Reproductive/Obstetrics negative OB ROS                           Anesthesia Physical Anesthesia Plan  ASA: II  Anesthesia Plan: MAC   Post-op Pain Management:    Induction:   Airway Management Planned:   Additional Equipment:   Intra-op Plan:   Post-operative Plan:   Informed Consent: I have reviewed the patients History and Physical, chart, labs and discussed the procedure including the risks, benefits and alternatives for the proposed anesthesia with the patient or authorized representative who has indicated his/her understanding and acceptance.   Dental advisory given  Plan Discussed with: CRNA  Anesthesia Plan Comments:         Anesthesia Quick Evaluation

## 2011-08-24 NOTE — Progress Notes (Signed)
CBC drawn by order of Dr Melvia Heaps at [address] [place].

## 2011-08-27 ENCOUNTER — Encounter (HOSPITAL_COMMUNITY): Payer: Self-pay | Admitting: Gastroenterology

## 2011-09-28 ENCOUNTER — Other Ambulatory Visit: Payer: Self-pay | Admitting: Gastroenterology

## 2011-09-28 DIAGNOSIS — D649 Anemia, unspecified: Secondary | ICD-10-CM

## 2011-10-01 ENCOUNTER — Other Ambulatory Visit (INDEPENDENT_AMBULATORY_CARE_PROVIDER_SITE_OTHER): Payer: Medicare Other

## 2011-10-01 DIAGNOSIS — D649 Anemia, unspecified: Secondary | ICD-10-CM

## 2011-10-01 LAB — CBC WITH DIFFERENTIAL/PLATELET
HCT: 38.5 % — ABNORMAL LOW (ref 39.0–52.0)
Hemoglobin: 12.5 g/dL — ABNORMAL LOW (ref 13.0–17.0)
Monocytes Relative: 14 % — ABNORMAL HIGH (ref 3.0–12.0)
RDW: 17.8 % — ABNORMAL HIGH (ref 11.5–14.6)

## 2011-10-27 ENCOUNTER — Telehealth: Payer: Self-pay | Admitting: Hematology and Oncology

## 2011-10-27 NOTE — Telephone Encounter (Signed)
spoke with wife and she is aware off 4/5 and 4/9 appts aom

## 2011-11-29 ENCOUNTER — Ambulatory Visit (INDEPENDENT_AMBULATORY_CARE_PROVIDER_SITE_OTHER): Payer: Medicare Other | Admitting: Gastroenterology

## 2011-11-29 ENCOUNTER — Encounter: Payer: Self-pay | Admitting: Gastroenterology

## 2011-11-29 ENCOUNTER — Other Ambulatory Visit (INDEPENDENT_AMBULATORY_CARE_PROVIDER_SITE_OTHER): Payer: Medicare Other

## 2011-11-29 VITALS — BP 118/70 | HR 65 | Ht 74.0 in | Wt 216.8 lb

## 2011-11-29 DIAGNOSIS — D509 Iron deficiency anemia, unspecified: Secondary | ICD-10-CM

## 2011-11-29 DIAGNOSIS — D649 Anemia, unspecified: Secondary | ICD-10-CM

## 2011-11-29 LAB — CBC WITH DIFFERENTIAL/PLATELET
Basophils Absolute: 0 10*3/uL (ref 0.0–0.1)
Eosinophils Relative: 3 % (ref 0.0–5.0)
HCT: 37 % — ABNORMAL LOW (ref 39.0–52.0)
Hemoglobin: 11.5 g/dL — ABNORMAL LOW (ref 13.0–17.0)
Lymphs Abs: 0.9 10*3/uL (ref 0.7–4.0)
Monocytes Relative: 19.6 % — ABNORMAL HIGH (ref 3.0–12.0)
Neutro Abs: 2.9 10*3/uL (ref 1.4–7.7)
RDW: 18.1 % — ABNORMAL HIGH (ref 11.5–14.6)

## 2011-11-29 NOTE — Patient Instructions (Signed)
Follow up as needed You will need to go to the basement for labs today

## 2011-11-29 NOTE — Assessment & Plan Note (Addendum)
Iron deficiency anemia is most likely related to AVMs in his GI tract. Since  obliterating  AVM in the small and large bowel and maintaining supplementary iron his hemoglobin has been steadily rising.  Medications #1 continue iron supplementation #2 check CBC today and then again in 3 and 6 months. I instructed  the patient to followup with Dr. Wylene Simmer for this

## 2011-11-29 NOTE — Progress Notes (Signed)
History of Present Illness:  [name] has returned for followup of his iron deficiency anemia. Colonoscopy demonstrated a small AVM in the cecum which was cauterized. Small bowel enteroscopy showed similar findings.  AVMs were obliterated. He remains on supplementary iron. Hemoglobin in January, 2013 was 12.5. He has no GI complaints.    Review of Systems: Pertinent positive and negative review of systems were noted in the above HPI section. All other review of systems were otherwise negative.    Current Medications, Allergies, Past Medical History, Past Surgical History, Family History and Social History were reviewed in [employer] electronic medical record  Vital signs were reviewed in today's medical record. Physical Exam: General: Well developed , well nourished, no acute distress

## 2011-12-21 ENCOUNTER — Other Ambulatory Visit (HOSPITAL_BASED_OUTPATIENT_CLINIC_OR_DEPARTMENT_OTHER): Payer: Medicare Other | Admitting: Lab

## 2011-12-21 DIAGNOSIS — K5520 Angiodysplasia of colon without hemorrhage: Secondary | ICD-10-CM

## 2011-12-21 DIAGNOSIS — D472 Monoclonal gammopathy: Secondary | ICD-10-CM

## 2011-12-21 DIAGNOSIS — D509 Iron deficiency anemia, unspecified: Secondary | ICD-10-CM

## 2011-12-21 DIAGNOSIS — D573 Sickle-cell trait: Secondary | ICD-10-CM

## 2011-12-21 LAB — CBC WITH DIFFERENTIAL/PLATELET
BASO%: 0.4 % (ref 0.0–2.0)
EOS%: 2.1 % (ref 0.0–7.0)
HCT: 37.6 % — ABNORMAL LOW (ref 38.4–49.9)
LYMPH%: 23.6 % (ref 14.0–49.0)
MCH: 23.5 pg — ABNORMAL LOW (ref 27.2–33.4)
MCHC: 32.7 g/dL (ref 32.0–36.0)
MCV: 71.8 fL — ABNORMAL LOW (ref 79.3–98.0)
MONO%: 11.5 % (ref 0.0–14.0)
NEUT%: 62.4 % (ref 39.0–75.0)
Platelets: 200 10*3/uL (ref 140–400)

## 2011-12-21 LAB — COMPREHENSIVE METABOLIC PANEL
AST: 32 U/L (ref 0–37)
Alkaline Phosphatase: 56 U/L (ref 39–117)
BUN: 10 mg/dL (ref 6–23)
Creatinine, Ser: 1.23 mg/dL (ref 0.50–1.35)

## 2011-12-21 LAB — IRON AND TIBC
%SAT: 9 % — ABNORMAL LOW (ref 20–55)
TIBC: 380 ug/dL (ref 215–435)
UIBC: 344 ug/dL (ref 125–400)

## 2011-12-21 LAB — VITAMIN B12: Vitamin B-12: 465 pg/mL (ref 211–911)

## 2011-12-25 ENCOUNTER — Ambulatory Visit (HOSPITAL_BASED_OUTPATIENT_CLINIC_OR_DEPARTMENT_OTHER): Payer: Medicare Other | Admitting: Hematology and Oncology

## 2011-12-25 ENCOUNTER — Telehealth: Payer: Self-pay | Admitting: Hematology and Oncology

## 2011-12-25 ENCOUNTER — Other Ambulatory Visit: Payer: Self-pay | Admitting: Hematology and Oncology

## 2011-12-25 ENCOUNTER — Ambulatory Visit: Payer: Medicare Other

## 2011-12-25 ENCOUNTER — Ambulatory Visit (HOSPITAL_BASED_OUTPATIENT_CLINIC_OR_DEPARTMENT_OTHER): Payer: Medicare Other

## 2011-12-25 ENCOUNTER — Encounter: Payer: Self-pay | Admitting: Hematology and Oncology

## 2011-12-25 VITALS — BP 136/78 | HR 78 | Temp 97.0°F | Ht 74.0 in | Wt 216.4 lb

## 2011-12-25 VITALS — BP 135/75 | HR 87 | Temp 97.7°F

## 2011-12-25 DIAGNOSIS — D649 Anemia, unspecified: Secondary | ICD-10-CM

## 2011-12-25 DIAGNOSIS — D472 Monoclonal gammopathy: Secondary | ICD-10-CM

## 2011-12-25 DIAGNOSIS — D509 Iron deficiency anemia, unspecified: Secondary | ICD-10-CM

## 2011-12-25 DIAGNOSIS — Q279 Congenital malformation of peripheral vascular system, unspecified: Secondary | ICD-10-CM

## 2011-12-25 MED ORDER — SODIUM CHLORIDE 0.9 % IV SOLN
Freq: Once | INTRAVENOUS | Status: AC
  Administered 2011-12-25: 13:00:00 via INTRAVENOUS

## 2011-12-25 MED ORDER — SODIUM CHLORIDE 0.9 % IV SOLN
1020.0000 mg | Freq: Once | INTRAVENOUS | Status: AC
  Administered 2011-12-25: 1020 mg via INTRAVENOUS
  Filled 2011-12-25: qty 34

## 2011-12-25 NOTE — Progress Notes (Signed)
This office note has been dictated.

## 2011-12-25 NOTE — Progress Notes (Signed)
CC:   [name], M.D. [name]. Ruben Dice, MD,FACG  IDENTIFYING STATEMENT:  The patient is a 71 year old man with iron- deficiency anemia who presents for followup.  INTERVAL HISTORY:  [name] reports that a recent GI evaluation had noted a small AVM.  He underwent cauterization.  He is on oral iron.  He notes good energy levels.  He had repeat labs done at [facility] and results are as follows.  12/21/2011 hemoglobin and hematocrit 12.3 and 37.6 respectively, smear revealed micro and hypochromic RBCs; iron 36, TIBC 380, saturation 9%, ferritin 19 (18), B12 465.  MEDICATIONS:  Niferex daily.  Rest of medicines reviewed and updated.  PAST MEDICAL HISTORY/FAMILY HISTORY/SOCIAL HISTORY:  Unchanged.  REVIEW OF SYSTEMS:  Essentially negative.  PHYSICAL EXAM:  Alert and oriented x3.  Vitals:  Pulse 78, blood pressure 136/78, temperature 97, respirations 18, weight is 216.4 pounds.  HEENT:  Head is atraumatic, normocephalic.  Mouth moist. Abdomen:  Soft, nontender.  Bowel sounds present.  Extremities:  No edema.  LAB DATA:  As above.  In addition, white cell count 5.3, hemoglobin 200. Sodium 141, potassium 4.2, chloride 107, CO2 24, BUN 10, creatinine 1.23, T. bilirubin 0.6, alkaline phosphatase 56, AST 32, ALT 23, calcium 9.2.  IMPRESSION AND PLAN:  [name] is a 71 year old man with: 1. 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  He     has received IV INFeD in the past, last treated in January 2012.     His iron stores are trending downward despite being on oral iron.     He will receive Feraheme. 2. IgG monoclonal gammopathy of undetermined significance.     He follows up in 9 months'time.    ______________________________ Laurice Record, M.D. LIO/MEDQ  D:  12/25/2011  T:  12/25/2011  Job:  [id number]

## 2011-12-25 NOTE — Telephone Encounter (Signed)
gve the pt his appt calendar for today's infusion and the appts for jan 2014. [name] was unable to schedule his iron infusion for 09/2012. Pt is aware. Pt is aware we will schedule that appt as soon as we are able to

## 2011-12-25 NOTE — Patient Instructions (Signed)
Patient to follow up as instructed.   Current Outpatient Prescriptions  Medication Sig Dispense Refill  . allopurinol (ZYLOPRIM) 100 MG tablet One tablet by mouth once daily       . atenolol (TENORMIN) 25 MG tablet One tablet by mouth once daily       . atorvastatin (LIPITOR) 20 MG tablet Take 20 mg by mouth daily.        . Cimetidine (ACID REDUCER PO) Take 1 tablet by mouth at bedtime.        . Cinnamon 500 MG capsule Take 500 mg by mouth daily.        . iron polysaccharides (NIFEREX) 150 MG capsule Take 150 mg by mouth daily.       . Multiple Vitamin (MULTIVITAMIN) tablet Take 1 tablet by mouth daily.              April 2013  Sunday Monday Tuesday Wednesday Thursday Friday Saturday      1   2   3   4   5    LAB MO     9:30 AM  (15 min.)  Krista Blue  [facility] MEDICAL ONCOLOGY 6    7   8   9    EST PT 30  10:00 AM  (30 min.)  Laurice Record, MD  [facility] MEDICAL ONCOLOGY 10   11   12   13    14   15   16   17   18   19   20    21   22   23   24   25   26   27    28   29    30

## 2011-12-26 ENCOUNTER — Other Ambulatory Visit: Payer: Self-pay | Admitting: Hematology and Oncology

## 2011-12-26 DIAGNOSIS — D472 Monoclonal gammopathy: Secondary | ICD-10-CM

## 2011-12-31 LAB — SPEP & IFE WITH QIG
Gamma Globulin: 18.7 % (ref 11.1–18.8)
IgG (Immunoglobin G), Serum: 1580 mg/dL (ref 650–1600)
IgM, Serum: 79 mg/dL (ref 41–251)
M-Spike, %: 0.46 g/dL

## 2012-09-06 ENCOUNTER — Telehealth: Payer: Self-pay | Admitting: Oncology

## 2012-09-06 NOTE — Telephone Encounter (Signed)
s./w pt regarding his 1/14,has been re assign to dr g/lisa and the pt is aware!!!    anne

## 2012-09-20 ENCOUNTER — Encounter: Payer: Self-pay | Admitting: Oncology

## 2012-09-26 ENCOUNTER — Other Ambulatory Visit (HOSPITAL_BASED_OUTPATIENT_CLINIC_OR_DEPARTMENT_OTHER): Payer: Medicare Other

## 2012-09-26 ENCOUNTER — Ambulatory Visit (HOSPITAL_COMMUNITY)
Admission: RE | Admit: 2012-09-26 | Discharge: 2012-09-26 | Disposition: A | Discharge status: Home / Self Care | Payer: Medicare Other | Source: Ambulatory Visit | Attending: Hematology and Oncology | Admitting: Hematology and Oncology

## 2012-09-26 DIAGNOSIS — M25559 Pain in unspecified hip: Secondary | ICD-10-CM | POA: Insufficient documentation

## 2012-09-26 DIAGNOSIS — M47812 Spondylosis without myelopathy or radiculopathy, cervical region: Secondary | ICD-10-CM | POA: Insufficient documentation

## 2012-09-26 DIAGNOSIS — M47817 Spondylosis without myelopathy or radiculopathy, lumbosacral region: Secondary | ICD-10-CM | POA: Insufficient documentation

## 2012-09-26 DIAGNOSIS — D649 Anemia, unspecified: Secondary | ICD-10-CM

## 2012-09-26 DIAGNOSIS — M545 Low back pain, unspecified: Secondary | ICD-10-CM | POA: Insufficient documentation

## 2012-09-26 DIAGNOSIS — D509 Iron deficiency anemia, unspecified: Secondary | ICD-10-CM | POA: Insufficient documentation

## 2012-09-26 DIAGNOSIS — M47814 Spondylosis without myelopathy or radiculopathy, thoracic region: Secondary | ICD-10-CM | POA: Insufficient documentation

## 2012-09-26 LAB — CBC WITH DIFFERENTIAL/PLATELET
BASO%: 0.8 % (ref 0.0–2.0)
MCHC: 30.3 g/dL — ABNORMAL LOW (ref 32.0–36.0)
MONO#: 0.7 10*3/uL (ref 0.1–0.9)
RBC: 4.75 10*6/uL (ref 4.20–5.82)
RDW: 18 % — ABNORMAL HIGH (ref 11.0–14.6)
WBC: 4.9 10*3/uL (ref 4.0–10.3)
lymph#: 0.9 10*3/uL (ref 0.9–3.3)

## 2012-09-26 LAB — COMPREHENSIVE METABOLIC PANEL (CC13)
ALT: 17 U/L (ref 0–55)
AST: 22 U/L (ref 5–34)
Chloride: 108 mEq/L — ABNORMAL HIGH (ref 98–107)
Creatinine: 1.2 mg/dL (ref 0.7–1.3)
Total Bilirubin: 0.48 mg/dL (ref 0.20–1.20)

## 2012-09-30 ENCOUNTER — Telehealth: Payer: Self-pay | Admitting: Oncology

## 2012-09-30 ENCOUNTER — Other Ambulatory Visit (HOSPITAL_COMMUNITY): Payer: Medicare Other

## 2012-09-30 ENCOUNTER — Ambulatory Visit (HOSPITAL_BASED_OUTPATIENT_CLINIC_OR_DEPARTMENT_OTHER): Payer: Medicare Other | Admitting: Nurse Practitioner

## 2012-09-30 ENCOUNTER — Ambulatory Visit: Payer: Medicare Other | Admitting: Hematology and Oncology

## 2012-09-30 ENCOUNTER — Telehealth: Payer: Self-pay | Admitting: *Deleted

## 2012-09-30 VITALS — BP 145/70 | HR 106 | Temp 98.0°F | Resp 18 | Ht 74.0 in | Wt 216.5 lb

## 2012-09-30 DIAGNOSIS — D472 Monoclonal gammopathy: Secondary | ICD-10-CM

## 2012-09-30 DIAGNOSIS — D573 Sickle-cell trait: Secondary | ICD-10-CM

## 2012-09-30 DIAGNOSIS — F1010 Alcohol abuse, uncomplicated: Secondary | ICD-10-CM

## 2012-09-30 DIAGNOSIS — D509 Iron deficiency anemia, unspecified: Secondary | ICD-10-CM

## 2012-09-30 LAB — SPEP & IFE WITH QIG
Albumin ELP: 53 % — ABNORMAL LOW (ref 55.8–66.1)
Beta 2: 6.5 % (ref 3.2–6.5)
Gamma Globulin: 18.6 % (ref 11.1–18.8)
IgA: 339 mg/dL (ref 68–379)
IgM, Serum: 80 mg/dL (ref 41–251)

## 2012-09-30 LAB — IRON AND TIBC: %SAT: 3 % — ABNORMAL LOW (ref 20–55)

## 2012-09-30 NOTE — Progress Notes (Signed)
OFFICE PROGRESS NOTE  Interval history:  [name] is a 72 year old man formerly followed by Dr. Dalene Carrow for iron deficiency anemia. He was initially referred in February 2010 when he was found to have a hemoglobin of 4.9. He had an upper endoscopy on 10/26/2008 and a colonoscopy on 11/04/2008 with both being unremarkable. The colonoscopy did show a tubular adenoma in the transverse colon which was excised. Iron was less than 10, ferritin 10; B12 normal at 410, haptoglobin 120, G6PD 27, chemistry panel unremarkable with a BUN of 9 and creatinine 0.93; DAT negative. Serum protein electrophoresis was negative for an M spike. Serum IFE was positive for monoclonal IgG lambda protein; serum immunoglobulins were all within normal range including IgG 1280, IgA 328 and IgM 72. Hemoglobin electrophoresis showed 86% hemoglobin A and 11% hemoglobin S consistent with sickle trait. Bone marrow biopsy on 11/18/2008 showed trilineage hematopoiesis with relative increase in erythroid elements;  marked decrease in iron stores. Cytogenetic analysis showed a normal male karyotype with no clonal abnormalities.  He has had IV iron intermittently over the past several years. Most recently he received Feraheme 1020 mg on 12/25/2011 when the ferritin returned at 19, iron 36, hemoglobin 12.3 and MCV 71 on 12/21/2011.  On 08/24/2011 he underwent a small bowel endoscopy with laser ablation of AVMs. 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 Gastritis was found in the body and antrum of the stomach. There was mild diffuse erythema with erosions.  He is seen today for scheduled followup and to establish care with Dr. Cyndie Chime.  Overall he feels well. He noted fatigue and dyspnea on exertion upon walking into the office today from the parking lot. He denies chest pain. No bloody or black stools. He is taking Niferex 150 mg daily and tolerating without difficulty. He has a good appetite. He reports his weight  is stable. No fevers or sweats. He denies abdominal pain. Bowels moving regularly.  He quit smoking greater than 15 years ago at less than one pack per day. He has 2-3 drinks per day of gin and one beer a day. Family history is negative for anemia, malignancy. He is married. He has a daughter who has asthma. He has lived in [place] since 2007. 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   Objective: Blood pressure 145/70, pulse 106, temperature 98 F (36.7 C), temperature source Oral, resp. rate 18, height 6\' 2"  (1.88 m), weight 216 lb 8 oz (98.204 kg).  Oropharynx is without thrush or ulceration. No palpable cervical, supraclavicular or axillary lymph nodes. Lungs are clear. No wheezes or rales. Regular cardiac rhythm. Abdomen is soft and nontender. No organomegaly. Extremities are without edema. Motor strength is 5 over 5.  Lab Results: Lab Results  Component Value Date   WBC 4.9 09/26/2012   HGB 9.4* 09/26/2012   HCT 31.0* 09/26/2012   MCV 65.3* 09/26/2012   PLT 245 09/26/2012    Chemistry:    Chemistry      Component Value Date/Time   NA 140 09/26/2012 0928   NA 141 12/21/2011 1019   K 4.1 09/26/2012 0928   K 4.2 12/21/2011 1019   CL 108* 09/26/2012 0928   CL 107 12/21/2011 1019   CO2 23 09/26/2012 0928   CO2 24 12/21/2011 1019   BUN 13.0 09/26/2012 0928   BUN 10 12/21/2011 1019   CREATININE 1.2 09/26/2012 0928   CREATININE 1.23 12/21/2011 1019  Component Value Date/Time   CALCIUM 9.1 09/26/2012 0928   CALCIUM 9.2 12/21/2011 1019   ALKPHOS 61 09/26/2012 0928   ALKPHOS 56 12/21/2011 1019   AST 22 09/26/2012 0928   AST 32 12/21/2011 1019   ALT 17 09/26/2012 0928   ALT 23 12/21/2011 1019   BILITOT 0.48 09/26/2012 0928   BILITOT 0.6 12/21/2011 1019       Studies/Results: Dg Bone Survey Met  09/26/2012  *RADIOLOGY REPORT*  Clinical Data: Iron deficiency anemia, some hip and low back pain radiating to the right leg  METASTATIC BONE SURVEY   Comparison: None.  Findings: A lateral view of the skull shows no radiolucent lesion. The sella turcica appears normal.  The cervical vertebrae are in normal alignment.  There is diffuse degenerative disc disease from C2-C7 with loss of disc space, sclerosis, and spurring.  The thoracic vertebrae are in normal alignment.  No compression deformity is seen.  The lumbar vertebrae are in normal alignment.  There is degenerative disc disease at the L5-S1 level.  No compression deformity is seen.  No lytic or blastic lesion is noted.  Views of the left shoulder and left humeri show no radiolucent or blastic lesion.  Mild degenerative change is noted at the insertion of the rotator cuff.  Views of the right shoulder and humerus show no lytic or blastic lesion.  Mild degenerative change is noted at the insertion of the right rotator cuff.  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  Mild degenerative change is present in both hips.  The SI joints are corticated.  No lytic or blastic lesion is seen.  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  No lytic or blastic lesion is seen.  Views of the right femur show mild degenerative change in the right hip.  No lytic or blastic lesion is seen.  Arterial calcification is noted.  A single PA and chest x-ray shows the lungs to be clear. Mediastinal contours are normal.  The heart is within upper limits of normal.  No acute bony abnormality is seen.  IMPRESSION:  1.  Degenerative change throughout the cervical, thoracic, and lumbar spine.  No compression deformity. 2.  No lytic or blastic bony lesion.  3.  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  Mild degenerative change of the right hip.   Original Report Authenticated By: Dwyane Dee, M.D.      Medications: I have reviewed the patient's current medications.  Assessment/Plan:  1. Iron deficiency anemia. 2. 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 3. Sickle trait. 4. Monoclonal gammopathy of undetermined significance. 5. EtOH use. 6. Bone survey 09/26/2012 with degenerative changes throughout the cervical, thoracic and lumbar spine. No lytic or blastic bony lesions. 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 Moderate degenerative change of the left hip. Mild degenerative change of the right hip.  Disposition-Mr. Chavira has progressive anemia consistent with ongoing iron deficiency. He has a documented history of AVMs of the small and large bowel. We are scheduling him for a dose of IV iron in the next one to 2 weeks. He will increase Niferex to 150 mg twice daily. He will complete a set of stool cards. We will have him return in 3 months for  a CBC and ferritin. He will return in 6 months for a followup visit.  Patient seen with Dr. Cyndie Chime.  Lonna Cobb ANP/GNP-BC

## 2012-09-30 NOTE — Telephone Encounter (Signed)
appts made and printed for pt aom,pt aware that i will call with the iron appt and an email has been sent to mw

## 2012-09-30 NOTE — Telephone Encounter (Signed)
Per staff message and POF I have scheduled appts.  JMW  

## 2012-10-02 ENCOUNTER — Telehealth: Payer: Self-pay | Admitting: Oncology

## 2012-10-03 ENCOUNTER — Ambulatory Visit: Payer: Medicare Other | Admitting: Lab

## 2012-10-03 ENCOUNTER — Other Ambulatory Visit (HOSPITAL_BASED_OUTPATIENT_CLINIC_OR_DEPARTMENT_OTHER): Payer: Medicare Other | Admitting: Nurse Practitioner

## 2012-10-03 ENCOUNTER — Ambulatory Visit (HOSPITAL_BASED_OUTPATIENT_CLINIC_OR_DEPARTMENT_OTHER): Payer: Medicare Other

## 2012-10-03 VITALS — BP 127/60 | HR 91 | Temp 97.1°F | Resp 20

## 2012-10-03 DIAGNOSIS — D509 Iron deficiency anemia, unspecified: Secondary | ICD-10-CM

## 2012-10-03 LAB — FECAL OCCULT BLOOD, GUAIAC: Occult Blood: POSITIVE

## 2012-10-03 MED ORDER — SODIUM CHLORIDE 0.9 % IV SOLN
Freq: Once | INTRAVENOUS | Status: AC
  Administered 2012-10-03: 12:00:00 via INTRAVENOUS

## 2012-10-03 MED ORDER — SODIUM CHLORIDE 0.9 % IV SOLN
1020.0000 mg | Freq: Once | INTRAVENOUS | Status: AC
  Administered 2012-10-03: 1020 mg via INTRAVENOUS
  Filled 2012-10-03: qty 34

## 2012-10-03 NOTE — Patient Instructions (Signed)
Ferumoxytol injection What is this medicine? FERUMOXYTOL is an iron complex. Iron is used to make healthy red blood cells, which carry oxygen and nutrients throughout the body. This medicine is used to treat iron deficiency anemia in people with chronic kidney disease. This medicine may be used for other purposes; ask your health care provider or pharmacist if you have questions. What should I tell my health care provider before I take this medicine? They need to know if you have any of these conditions: -anemia not caused by low iron levels -high levels of iron in the blood -magnetic resonance imaging (MRI) test scheduled -an unusual or allergic reaction to iron, other medicines, foods, dyes, or preservatives -pregnant or trying to get pregnant -breast-feeding How should I use this medicine? This medicine is for infusion into a vein. It is given by a health care professional in a hospital or clinic setting. Talk to your pediatrician regarding the use of this medicine in children. Special care may be needed. Overdosage: If you think you've taken too much of this medicine contact a poison control center or emergency room at once. Overdosage: If you think you have taken too much of this medicine contact a poison control center or emergency room at once. NOTE: This medicine is only for you. Do not share this medicine with others. What if I miss a dose? It is important not to miss your dose. Call your doctor or health care professional if you are unable to keep an appointment. What may interact with this medicine? This medicine may interact with the following medications: -other iron products This list may not describe all possible interactions. Give your health care provider a list of all the medicines, herbs, non-prescription drugs, or dietary supplements you use. Also tell them if you smoke, drink alcohol, or use illegal drugs. Some items may interact with your medicine. What should I watch  for while using this medicine? Visit your doctor or healthcare professional regularly. Tell your doctor or healthcare professional if your symptoms do not start to get better or if they get worse. You may need blood work done while you are taking this medicine. You may need to follow a special diet. Talk to your doctor. Foods that contain iron include: whole grains/cereals, dried fruits, beans, or peas, leafy green vegetables, and organ meats (liver, kidney). What side effects may I notice from receiving this medicine? Side effects that you should report to your doctor or health care professional as soon as possible: -allergic reactions like skin rash, itching or hives, swelling of the face, lips, or tongue -breathing problems -changes in blood pressure -feeling faint or lightheaded, falls -fever or chills -flushing, sweating, or hot feelings -swelling of the ankles or feet Side effects that usually do not require medical attention (Report these to your doctor or health care professional if they continue or are bothersome.): -diarrhea -headache -nausea, vomiting -stomach pain This list may not describe all possible side effects. Call your doctor for medical advice about side effects. You may report side effects to FDA at 1-800-FDA-1088. Where should I keep my medicine? This drug is given in a hospital or clinic and will not be stored at home. NOTE: This sheet is a summary. It may not cover all possible information. If you have questions about this medicine, talk to your doctor, pharmacist, or health care provider.  2013, [employer]. (05/26/2008 9:48:25 PM)  

## 2012-12-30 ENCOUNTER — Other Ambulatory Visit (HOSPITAL_BASED_OUTPATIENT_CLINIC_OR_DEPARTMENT_OTHER): Payer: Medicare Other | Admitting: Lab

## 2012-12-30 DIAGNOSIS — D509 Iron deficiency anemia, unspecified: Secondary | ICD-10-CM

## 2012-12-30 DIAGNOSIS — D472 Monoclonal gammopathy: Secondary | ICD-10-CM

## 2012-12-30 LAB — CBC WITH DIFFERENTIAL/PLATELET
Basophils Absolute: 0 10*3/uL (ref 0.0–0.1)
Eosinophils Absolute: 0.2 10*3/uL (ref 0.0–0.5)
HGB: 13.1 g/dL (ref 13.0–17.1)
MONO#: 0.9 10*3/uL (ref 0.1–0.9)
NEUT#: 2.9 10*3/uL (ref 1.5–6.5)
RDW: 21.7 % — ABNORMAL HIGH (ref 11.0–14.6)
lymph#: 1.2 10*3/uL (ref 0.9–3.3)

## 2012-12-31 LAB — KAPPA/LAMBDA LIGHT CHAINS
Kappa free light chain: 1.43 mg/dL (ref 0.33–1.94)
Lambda Free Lght Chn: 4.17 mg/dL — ABNORMAL HIGH (ref 0.57–2.63)

## 2013-01-01 ENCOUNTER — Telehealth: Payer: Self-pay | Admitting: *Deleted

## 2013-01-01 NOTE — Telephone Encounter (Signed)
Message copied by Orbie Hurst on Thu Jan 01, 2013  5:08 PM ------      Message from: Levert Feinstein      Created: Thu Jan 01, 2013 10:27 AM       Call pt: Hb up from 9 to 13 after IV iron! ------

## 2013-01-01 NOTE — Telephone Encounter (Signed)
Message copied by Orbie Hurst on Thu Jan 01, 2013  5:06 PM ------      Message from: Levert Feinstein      Created: Thu Jan 01, 2013 10:27 AM       Call pt: Hb up from 9 to 13 after IV iron! ------

## 2013-01-01 NOTE — Telephone Encounter (Signed)
Spoke with patient.  Let him know that Hgb is up from 9 to 13 after IV iron.  Reviewed appts in July.  He appreciated the call back.

## 2013-01-03 ENCOUNTER — Ambulatory Visit (INDEPENDENT_AMBULATORY_CARE_PROVIDER_SITE_OTHER): Payer: Medicare Other | Admitting: Emergency Medicine

## 2013-01-03 ENCOUNTER — Ambulatory Visit: Payer: Medicare Other

## 2013-01-03 VITALS — BP 131/73 | HR 87 | Temp 98.0°F | Resp 16 | Ht 74.0 in | Wt 209.0 lb

## 2013-01-03 DIAGNOSIS — M25562 Pain in left knee: Secondary | ICD-10-CM

## 2013-01-03 DIAGNOSIS — R238 Other skin changes: Secondary | ICD-10-CM

## 2013-01-03 DIAGNOSIS — M25569 Pain in unspecified knee: Secondary | ICD-10-CM

## 2013-01-03 LAB — URIC ACID: Uric Acid, Serum: 5.3 mg/dL (ref 4.0–7.8)

## 2013-01-03 LAB — POCT CBC
HCT, POC: 41.4 % — AB (ref 43.5–53.7)
MCH, POC: 26.4 pg — AB (ref 27–31.2)
MCV: 85.5 fL (ref 80–97)
MID (cbc): 0.6 (ref 0–0.9)
POC LYMPH PERCENT: 15 %L (ref 10–50)
Platelet Count, POC: 199 10*3/uL (ref 142–424)
RBC: 4.84 M/uL (ref 4.69–6.13)
WBC: 6.2 10*3/uL (ref 4.6–10.2)

## 2013-01-03 LAB — BASIC METABOLIC PANEL
Calcium: 9.1 mg/dL (ref 8.4–10.5)
Glucose, Bld: 84 mg/dL (ref 70–99)
Sodium: 140 mEq/L (ref 135–145)

## 2013-01-03 MED ORDER — PREDNISONE 10 MG PO TABS: ORAL_TABLET | ORAL | Status: DC

## 2013-01-03 MED ORDER — HYDROCODONE-ACETAMINOPHEN 5-325 MG PO TABS: 1.0000 | ORAL_TABLET | Freq: Four times a day (QID) | ORAL | Status: DC | PRN

## 2013-01-03 NOTE — Patient Instructions (Addendum)
Gout Gout is an inflammatory condition (arthritis) caused by a buildup of uric acid crystals in the joints. Uric acid is a chemical that is normally present in the blood. Under some circumstances, uric acid can form into crystals in your joints. This causes joint redness, soreness, and swelling (inflammation). Repeat attacks are common. Over time, uric acid crystals can form into masses (tophi) near a joint, causing disfigurement. Gout is treatable and often preventable. CAUSES  The disease begins with elevated levels of uric acid in the blood. Uric acid is produced by your body when it breaks down a naturally found substance called purines. This also happens when you eat certain foods such as meats and fish. Causes of an elevated uric acid level include:  Being passed down from parent to child (heredity).  Diseases that cause increased uric acid production (obesity, psoriasis, some cancers).  Excessive alcohol use.  Diet, especially diets rich in meat and seafood.  Medicines, including certain cancer-fighting drugs (chemotherapy), diuretics, and aspirin.  Chronic kidney disease. The kidneys are no longer able to remove uric acid well.  Problems with metabolism. Conditions strongly associated with gout include:  Obesity.  High blood pressure.  High cholesterol.  Diabetes. Not everyone with elevated uric acid levels gets gout. It is not understood why some people get gout and others do not. Surgery, joint injury, and eating too much of certain foods are some of the factors that can lead to gout. SYMPTOMS   An attack of gout comes on quickly. It causes intense pain with redness, swelling, and warmth in a joint.  Fever can occur.  Often, only one joint is involved. Certain joints are more commonly involved:  Base of the big toe.  Knee.  Ankle.  Wrist.  Finger. Without treatment, an attack usually goes away in a few days to weeks. Between attacks, you usually will not have  symptoms, which is different from many other forms of arthritis. DIAGNOSIS  Your caregiver will suspect gout based on your symptoms and exam. Removal of fluid from the joint (arthrocentesis) is done to check for uric acid crystals. Your caregiver will give you a medicine that numbs the area (local anesthetic) and use a needle to remove joint fluid for exam. Gout is confirmed when uric acid crystals are seen in joint fluid, using a special microscope. Sometimes, blood, urine, and X-ray tests are also used. TREATMENT  There are 2 phases to gout treatment: treating the sudden onset (acute) attack and preventing attacks (prophylaxis). Treatment of an Acute Attack  Medicines are used. These include anti-inflammatory medicines or steroid medicines.  An injection of steroid medicine into the affected joint is sometimes necessary.  The painful joint is rested. Movement can worsen the arthritis.  You may use warm or cold treatments on painful joints, depending which works best for you.  Discuss the use of coffee, vitamin C, or cherries with your caregiver. These may be helpful treatment options. Treatment to Prevent Attacks After the acute attack subsides, your caregiver may advise prophylactic medicine. These medicines either help your kidneys eliminate uric acid from your body or decrease your uric acid production. You may need to stay on these medicines for a very long time. The early phase of treatment with prophylactic medicine can be associated with an increase in acute gout attacks. For this reason, during the first few months of treatment, your caregiver may also advise you to take medicines usually used for acute gout treatment. Be sure you understand your caregiver's directions.   You should also discuss dietary treatment with your caregiver. Certain foods such as meats and fish can increase uric acid levels. Other foods such as dairy can decrease levels. Your caregiver can give you a list of foods  to avoid. HOME CARE INSTRUCTIONS   Do not take aspirin to relieve pain. This raises uric acid levels.  Only take over-the-counter or prescription medicines for pain, discomfort, or fever as directed by your caregiver.  Rest the joint as much as possible. When in bed, keep sheets and blankets off painful areas.  Keep the affected joint raised (elevated).  Use crutches if the painful joint is in your leg.  Drink enough water and fluids to keep your urine clear or pale yellow. This helps your body get rid of uric acid. Do not drink alcoholic beverages. They slow the passage of uric acid.  Follow your caregiver's dietary instructions. Pay careful attention to the amount of protein you eat. Your daily diet should emphasize fruits, vegetables, whole grains, and fat-free or low-fat milk products.  Maintain a healthy body weight. SEEK MEDICAL CARE IF:   You have an oral temperature above 102 F (38.9 C).  You develop diarrhea, vomiting, or any side effects from medicines.  You do not feel better in 24 hours, or you are getting worse. SEEK IMMEDIATE MEDICAL CARE IF:   Your joint becomes suddenly more tender and you have:  Chills.  An oral temperature above 102 F (38.9 C), not controlled by medicine. MAKE SURE YOU:   Understand these instructions.  Will watch your condition.  Will get help right away if you are not doing well or get worse. Document Released: 08/31/2000 Document Revised: 11/26/2011 Document Reviewed: 12/12/2009 Exit[employer] Patient Information 2013 [employer].    

## 2013-01-03 NOTE — Progress Notes (Signed)
  Subjective:    Patient ID: [name], male    DOB: March 31, 1941, 72 y.o.   MRN: [id number]  HPI patient presents with approximately one-week history of pain in his left knee he has a history of gout. His pain is on the lateral side of the left knee he denies any trauma to this area. He has not had any locking he does have a history of gout.    Review of Systems     Objective:   Physical Exam there is tenderness over the head of the fibula on the left. There is mild tenderness over the left lateral collateral ligament. There is no knee instability there is no knee effusion there is a negative drawer sign.  Results for orders placed in visit on 01/03/13  POCT CBC      Result Value Range   WBC 6.2  4.6 - 10.2 K/uL   Lymph, poc 0.9  0.6 - 3.4   POC LYMPH PERCENT 15.0  10 - 50 %L   MID (cbc) 0.6  0 - 0.9   POC MID % 9.6  0 - 12 %M   POC Granulocyte 4.7  2 - 6.9   Granulocyte percent 75.4  37 - 80 %G   RBC 4.84  4.69 - 6.13 M/uL   Hemoglobin 12.8 (*) 14.1 - 18.1 g/dL   HCT, POC 40.9 (*) 81.1 - 53.7 %   MCV 85.5  80 - 97 fL   MCH, POC 26.4 (*) 27 - 31.2 pg   MCHC 30.9 (*) 31.8 - 35.4 g/dL   RDW, POC 91.4     Platelet Count, POC 199  142 - 424 K/uL   MPV 8.2  0 - 99.8 fL  [facility] reading (PRIMARY) by  Dr. Cleta Alberts there are two cystic like lesions in the distant femur. These areas have some spotty calcifications. Please comment        Assessment & Plan:  I suspect this is a gout flare. We'll treat with tapered dose of prednisone.

## 2013-03-31 ENCOUNTER — Other Ambulatory Visit (HOSPITAL_BASED_OUTPATIENT_CLINIC_OR_DEPARTMENT_OTHER): Payer: Medicare Other | Admitting: Lab

## 2013-03-31 DIAGNOSIS — D509 Iron deficiency anemia, unspecified: Secondary | ICD-10-CM

## 2013-03-31 LAB — CBC WITH DIFFERENTIAL/PLATELET
Basophils Absolute: 0 10*3/uL (ref 0.0–0.1)
EOS%: 3.5 % (ref 0.0–7.0)
HGB: 13.8 g/dL (ref 13.0–17.1)
LYMPH%: 19.6 % (ref 14.0–49.0)
MCH: 29.3 pg (ref 27.2–33.4)
MCV: 86.6 fL (ref 79.3–98.0)
MONO%: 15.6 % — ABNORMAL HIGH (ref 0.0–14.0)
NEUT%: 60.5 % (ref 39.0–75.0)
Platelets: 143 10*3/uL (ref 140–400)
RDW: 13.8 % (ref 11.0–14.6)

## 2013-04-07 ENCOUNTER — Ambulatory Visit (HOSPITAL_BASED_OUTPATIENT_CLINIC_OR_DEPARTMENT_OTHER): Payer: Medicare Other | Admitting: Lab

## 2013-04-07 ENCOUNTER — Ambulatory Visit (HOSPITAL_BASED_OUTPATIENT_CLINIC_OR_DEPARTMENT_OTHER): Payer: Medicare Other | Admitting: Oncology

## 2013-04-07 ENCOUNTER — Encounter: Payer: Self-pay | Admitting: Oncology

## 2013-04-07 VITALS — BP 135/76 | HR 97 | Temp 97.5°F | Resp 18 | Ht 74.0 in | Wt 216.4 lb

## 2013-04-07 DIAGNOSIS — R590 Localized enlarged lymph nodes: Secondary | ICD-10-CM | POA: Insufficient documentation

## 2013-04-07 DIAGNOSIS — D472 Monoclonal gammopathy: Secondary | ICD-10-CM

## 2013-04-07 DIAGNOSIS — Q2733 Arteriovenous malformation of digestive system vessel: Secondary | ICD-10-CM

## 2013-04-07 DIAGNOSIS — R599 Enlarged lymph nodes, unspecified: Secondary | ICD-10-CM

## 2013-04-07 DIAGNOSIS — D509 Iron deficiency anemia, unspecified: Secondary | ICD-10-CM

## 2013-04-07 HISTORY — DX: Localized enlarged lymph nodes: R59.0

## 2013-04-07 LAB — BASIC METABOLIC PANEL (CC13)
BUN: 9.2 mg/dL (ref 7.0–26.0)
CO2: 24 mEq/L (ref 22–29)
Calcium: 9.1 mg/dL (ref 8.4–10.4)
Chloride: 109 mEq/L (ref 98–109)
Creatinine: 1.1 mg/dL (ref 0.7–1.3)

## 2013-04-10 NOTE — Progress Notes (Signed)
Hematology and Oncology Follow Up Visit  [name] [id number] 11-08-1940 72 y.o. 04/10/2013 11:45 AM   Principle Diagnosis: Encounter Diagnoses  Name Primary?  . ARTERIOVENOUS MALFORMATION, COLON   . Unspecified iron deficiency anemia Yes  . Monoclonal gammopathy of undetermined significance   . Lymphadenopathy, axillary      Interim History:    Followup visit for this 72 year-old man formerly followed by Dr. Dalene Carrow for iron deficiency anemia. He was initially referred in February 2010 when he was found to have a hemoglobin of 4.9. He had an upper endoscopy on 10/26/2008 and a colonoscopy on 11/04/2008 with both being unremarkable. The colonoscopy did show a tubular adenoma in the transverse colon which was excised. Iron was less than 10, ferritin 10; B12 normal at 410, haptoglobin 120, G6PD 27, chemistry panel unremarkable with a BUN of 9 and creatinine 0.93; DAT negative. Serum protein electrophoresis was negative for an M spike. Serum IFE was positive for monoclonal IgG lambda protein; serum immunoglobulins were all within normal range including IgG 1280, IgA 328 and IgM 72. Hemoglobin electrophoresis showed 86% hemoglobin A and 11% hemoglobin S consistent with sickle trait. Bone marrow biopsy on 11/18/2008 showed trilineage hematopoiesis with relative increase in erythroid elements; marked decrease in iron stores. Cytogenetic analysis showed a normal male karyotype with no clonal abnormalities.  He has had IV iron intermittently over the past several years. Most recently he received Feraheme 1020 mg on 12/25/2011 when the ferritin returned at 19, iron 36, hemoglobin 12.3 and MCV 71 on 12/21/2011.  On 08/24/2011 he underwent a small bowel endoscopy with laser ablation of AVMs. 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 Gastritis was found in the body and antrum of the stomach. There was mild diffuse erythema with erosions.  He has had no interim medical problems  since visit here in January. He received a parenteral iron infusion at that time. Labs done last week in anticipation of today's visit on 03/31/2013 she'll ferritin 44. As a repeat immunoglobulin studies. Total serum immunoglobulins remain in the normal range. There is a persistent IgG lambda monoclonal protein on IFE.    Medications: reviewed  Allergies: No Known Allergies  Review of Systems: Constitutional:   No constitutional symptoms Respiratory: No cough or dyspnea Cardiovascular:  No chest pain or palpitations Gastrointestinal: No abdominal pain or change in bowel habit Genito-Urinary: No urinary tract symptoms Musculoskeletal: No muscle bone or joint pain Neurologic: No headache or change in vision Skin: No rash or ecchymosis Remaining ROS negative.  Physical Exam: Blood pressure 135/76, pulse 97, temperature 97.5 F (36.4 C), temperature source Oral, resp. rate 18, height 6\' 2"  (1.88 m), weight 216 lb 6.4 oz (98.158 kg). Wt Readings from Last 3 Encounters:  04/07/13 216 lb 6.4 oz (98.158 kg)  01/03/13 209 lb (94.802 kg)  09/30/12 216 lb 8 oz (98.204 kg)     General appearance: Well-nourished African American man HENNT: Pharynx no erythema or exudate Lymph nodes: Approximate 3 cm lymph node palpable in the right axilla no other areas of adenopathy in the neck, supraclavicular, left axillary or inguinal regions.  Breasts: Lungs: Clear to auscultation resonant to percussion Heart: Regular rhythm no murmur Abdomen: Soft, nontender, no mass, no organomegaly Extremities: No edema, no calf tenderness Musculoskeletal: No joint deformities GU: Vascular: No cyanosis, no carotid bruits Neurologic: Mental status intact, motor strength 5 over 5, Skin: No rash or ecchymosis  Lab Results: Lab Results  Component Value Date  WBC 4.4 03/31/2013   HGB 13.8 03/31/2013   HCT 41.0 03/31/2013   MCV 86.6 03/31/2013   PLT 143 03/31/2013     Chemistry      Component Value Date/Time    NA 140 04/07/2013 1306   NA 140 01/03/2013 1039   K 4.0 04/07/2013 1306   K 4.3 01/03/2013 1039   CL 108 01/03/2013 1039   CL 108* 09/26/2012 0928   CO2 24 04/07/2013 1306   CO2 23 01/03/2013 1039   BUN 9.2 04/07/2013 1306   BUN 12 01/03/2013 1039   CREATININE 1.1 04/07/2013 1306   CREATININE 1.22 01/03/2013 1039   CREATININE 1.23 12/21/2011 1019      Component Value Date/Time   CALCIUM 9.1 04/07/2013 1306   CALCIUM 9.1 01/03/2013 1039   ALKPHOS 61 09/26/2012 0928   ALKPHOS 56 12/21/2011 1019   AST 22 09/26/2012 0928   AST 32 12/21/2011 1019   ALT 17 09/26/2012 0928   ALT 23 12/21/2011 1019   BILITOT 0.48 09/26/2012 0928   BILITOT 0.6 12/21/2011 1019       Radiological Studies: No results found.  Impression: #1. 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  #2. 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  #3. Isolated, asymptomatic, right axillary lymph node enlargement. I discussed this with the patient today. I would like to get a CT scan to see if there are any other areas of concern. An underlying lymphoproliferative disorder might explain his monoclonal gammopathy. I Would like to give him a period of observation and reevaluation before referral for biopsy. I will schedule short interim followup.     CC:.    Levert Feinstein, MD 7/25/201411:45 AM

## 2013-04-14 ENCOUNTER — Ambulatory Visit (HOSPITAL_COMMUNITY)
Admission: RE | Admit: 2013-04-14 | Discharge: 2013-04-14 | Disposition: A | Discharge status: Home / Self Care | Payer: Medicare Other | Source: Ambulatory Visit | Attending: Oncology | Admitting: Oncology

## 2013-04-14 ENCOUNTER — Other Ambulatory Visit: Payer: Self-pay | Admitting: Oncology

## 2013-04-14 DIAGNOSIS — R590 Localized enlarged lymph nodes: Secondary | ICD-10-CM

## 2013-04-14 DIAGNOSIS — D472 Monoclonal gammopathy: Secondary | ICD-10-CM

## 2013-04-16 ENCOUNTER — Ambulatory Visit (HOSPITAL_COMMUNITY)
Admission: RE | Admit: 2013-04-16 | Discharge: 2013-04-16 | Disposition: A | Discharge status: Home / Self Care | Payer: Medicare Other | Source: Ambulatory Visit | Attending: Oncology | Admitting: Oncology

## 2013-04-16 ENCOUNTER — Encounter (HOSPITAL_COMMUNITY): Payer: Self-pay

## 2013-04-16 DIAGNOSIS — J479 Bronchiectasis, uncomplicated: Secondary | ICD-10-CM | POA: Insufficient documentation

## 2013-04-16 DIAGNOSIS — M87059 Idiopathic aseptic necrosis of unspecified femur: Secondary | ICD-10-CM | POA: Insufficient documentation

## 2013-04-16 DIAGNOSIS — R911 Solitary pulmonary nodule: Secondary | ICD-10-CM | POA: Insufficient documentation

## 2013-04-16 DIAGNOSIS — R599 Enlarged lymph nodes, unspecified: Secondary | ICD-10-CM | POA: Insufficient documentation

## 2013-04-16 DIAGNOSIS — I2510 Atherosclerotic heart disease of native coronary artery without angina pectoris: Secondary | ICD-10-CM | POA: Insufficient documentation

## 2013-04-16 DIAGNOSIS — M4180 Other forms of scoliosis, site unspecified: Secondary | ICD-10-CM | POA: Insufficient documentation

## 2013-04-16 DIAGNOSIS — R9389 Abnormal findings on diagnostic imaging of other specified body structures: Secondary | ICD-10-CM

## 2013-04-16 DIAGNOSIS — M169 Osteoarthritis of hip, unspecified: Secondary | ICD-10-CM | POA: Insufficient documentation

## 2013-04-16 DIAGNOSIS — M1610 Unilateral primary osteoarthritis, unspecified hip: Secondary | ICD-10-CM | POA: Insufficient documentation

## 2013-04-16 DIAGNOSIS — R590 Localized enlarged lymph nodes: Secondary | ICD-10-CM

## 2013-04-16 DIAGNOSIS — I700 Atherosclerosis of aorta: Secondary | ICD-10-CM | POA: Insufficient documentation

## 2013-04-16 DIAGNOSIS — R091 Pleurisy: Secondary | ICD-10-CM | POA: Insufficient documentation

## 2013-04-16 DIAGNOSIS — M479 Spondylosis, unspecified: Secondary | ICD-10-CM | POA: Insufficient documentation

## 2013-04-16 DIAGNOSIS — K5730 Diverticulosis of large intestine without perforation or abscess without bleeding: Secondary | ICD-10-CM | POA: Insufficient documentation

## 2013-04-16 HISTORY — DX: Abnormal findings on diagnostic imaging of other specified body structures: R93.89

## 2013-04-16 MED ORDER — IOHEXOL 300 MG/ML  SOLN
100.0000 mL | Freq: Once | INTRAMUSCULAR | Status: AC | PRN
  Administered 2013-04-16: 100 mL via INTRAVENOUS

## 2013-04-21 ENCOUNTER — Telehealth: Payer: Self-pay | Admitting: *Deleted

## 2013-04-21 NOTE — Telephone Encounter (Signed)
Message copied by Gala Romney on Tue Apr 21, 2013  2:46 PM ------      Message from: Levert Feinstein      Created: Thu Apr 16, 2013  4:12 PM       Call pt CT scans no other areas of enlarged lymph glands noted ------

## 2013-04-21 NOTE — Telephone Encounter (Signed)
Notified pt that CT scan showed no other areas of enlarged lymph glands.  Pt verbalized understanding and confirmed appt for 07/07/13.

## 2013-06-30 ENCOUNTER — Other Ambulatory Visit (HOSPITAL_BASED_OUTPATIENT_CLINIC_OR_DEPARTMENT_OTHER): Payer: Medicare Other | Admitting: Lab

## 2013-06-30 DIAGNOSIS — D509 Iron deficiency anemia, unspecified: Secondary | ICD-10-CM

## 2013-06-30 DIAGNOSIS — Q2733 Arteriovenous malformation of digestive system vessel: Secondary | ICD-10-CM

## 2013-06-30 LAB — CBC & DIFF AND RETIC
Basophils Absolute: 0 10*3/uL (ref 0.0–0.1)
Eosinophils Absolute: 0.1 10*3/uL (ref 0.0–0.5)
HCT: 39.2 % (ref 38.4–49.9)
HGB: 13.3 g/dL (ref 13.0–17.1)
LYMPH%: 19.6 % (ref 14.0–49.0)
MCV: 85 fL (ref 79.3–98.0)
MONO%: 16.9 % — ABNORMAL HIGH (ref 0.0–14.0)
NEUT#: 2.7 10*3/uL (ref 1.5–6.5)
NEUT%: 61.2 % (ref 39.0–75.0)
Platelets: 136 10*3/uL — ABNORMAL LOW (ref 140–400)

## 2013-06-30 LAB — IRON AND TIBC CHCC: TIBC: 309 ug/dL (ref 202–409)

## 2013-07-02 ENCOUNTER — Telehealth: Payer: Self-pay | Admitting: *Deleted

## 2013-07-02 NOTE — Telephone Encounter (Signed)
Per Dr. Cyndie Chime; notified pt's wife (pt out of town) that blood count stable.  Pt's wife verbalized understanding and stated she would let pt know.

## 2013-07-02 NOTE — Telephone Encounter (Signed)
Message copied by Gala Romney on Thu Jul 02, 2013  9:39 AM ------      Message from: Levert Feinstein      Created: Tue Jun 30, 2013  8:40 PM       Call pt blood count stable ------

## 2013-07-07 ENCOUNTER — Ambulatory Visit (HOSPITAL_BASED_OUTPATIENT_CLINIC_OR_DEPARTMENT_OTHER): Payer: Medicare Other | Admitting: Oncology

## 2013-07-07 ENCOUNTER — Telehealth: Payer: Self-pay | Admitting: Oncology

## 2013-07-07 VITALS — BP 148/84 | HR 102 | Temp 97.3°F | Resp 18 | Ht 74.0 in | Wt 213.8 lb

## 2013-07-07 DIAGNOSIS — D500 Iron deficiency anemia secondary to blood loss (chronic): Secondary | ICD-10-CM

## 2013-07-07 DIAGNOSIS — K922 Gastrointestinal hemorrhage, unspecified: Secondary | ICD-10-CM

## 2013-07-07 DIAGNOSIS — D472 Monoclonal gammopathy: Secondary | ICD-10-CM

## 2013-07-07 DIAGNOSIS — R590 Localized enlarged lymph nodes: Secondary | ICD-10-CM

## 2013-07-07 DIAGNOSIS — R599 Enlarged lymph nodes, unspecified: Secondary | ICD-10-CM

## 2013-07-07 NOTE — Telephone Encounter (Signed)
Gave pt appt for lab and Md for November and December 2014, will call surgeon tomorrow

## 2013-07-07 NOTE — Progress Notes (Signed)
Hematology and Oncology Follow Up Visit  [name] [id number] 1941/02/09 72 y.o. 07/07/2013 7:54 PM   Principle Diagnosis: Encounter Diagnoses  Name Primary?  . Lymphadenopathy, axillary Yes  . Monoclonal gammopathy of undetermined significance      Interim History:   Short term followup visit for this pleasant 72 year old man initially referred to this office for evaluation of iron deficiency anemia. 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 Please see my office summary note dated 04/07/2013 for complete details of his previous anemia evaluation. He has required periodic iron infusions. 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 He was also found to have positive sickle trait. No evidence for any hemolytic process. At the time of my examination on 04/07/2013, I felt an approximate 3 cm node in his right axilla. No other areas of adenopathy. I obtained a CT scan of the chest abdomen and pelvis done on July 31. The node that I was able to palpate clinically is not visible on the CT scan. There are no other significant areas of adenopathy or organomegaly. He has a few tiny subcentimeter nodules in his lungs and some calcified pleural plaquing. He denies any asbestos exposure. He stopped smoking 20 years ago.  He has had no interim medical problems.  Medications: reviewed  Allergies: No Known Allergies  Review of Systems: Hematology: negative for swollen glands, easy bruising,  ENT ROS: negative for - oral lesions or sore throat Breast ROS:  Respiratory ROS: negative for - cough, pleuritic pain, shortness of breath or wheezing Cardiovascular ROS: negative for - chest pain, dyspnea on exertion, edema, irregular heartbeat, murmur, orthopnea, palpitations, paroxysmal nocturnal dyspnea or rapid heart  rate Gastrointestinal ROS: negative for - abdominal pain, appetite loss, blood in stools, change in bowel habits, constipation, diarrhea, heartburn, hematemesis, melena, nausea/vomiting or swallowing difficulty/pain Genito-Urinary ROS: negative for -  dysuria, hematuria, incontinence, nocturia or urinary frequency/urgency Musculoskeletal ROS: negative for - joint pain, joint stiffness, joint swelling, muscle pain, muscular weakness  Neurological ROS: negative for any headache or change in vision. No paresthesias.  Dermatological ROS: negative for rash, ecchymosis Remaining ROS negative.  Physical Exam: Blood pressure 148/84, pulse 102, temperature 97.3 F (36.3 C), temperature source Oral, resp. rate 18, height 6\' 2"  (1.88 m), weight 213 lb 12.8 oz (96.979 kg). Wt Readings from Last 3 Encounters:  07/07/13 213 lb 12.8 oz (96.979 kg)  04/07/13 216 lb 6.4 oz (98.158 kg)  01/03/13 209 lb (94.802 kg)     General appearance:  HENNT: Pharynx no erythema, exudate, mass, or ulcer. No thyromegaly or thyroid nodules Lymph nodes: No cervical, or supraclavicular,lymphadenopathy. Persistent approximate 3 cm right axillary lymph node no change from my exam of July 2014. Breasts: No abnormal skin changes, no dominant mass in either breast Lungs: Clear to auscultation, resonant to percussion throughout Heart: Regular rhythm, no murmur, no gallop, no rub, no click, no edema Abdomen: Soft, nontender, normal bowel sounds, no mass, no organomegaly Extremities: No edema, no calf tenderness Musculoskeletal: no joint deformities GU: Vascular: Carotid pulses 2+, no bruits,  Neurologic: Alert, oriented, PERRLA, , cranial nerves grossly normal, motor strength 5 over 5, reflexes 1+ symmetric, upper body coordination normal, gait normal, Skin: No rash or ecchymosis  Lab Results: CBC W/Diff    Component Value Date/Time   WBC 4.4 06/30/2013 0828  WBC 6.2 01/03/2013 1104   WBC 5.0 11/29/2011 1032   RBC 4.61  06/30/2013 0828   RBC 4.84 01/03/2013 1104   RBC 4.88 11/29/2011 1032   HGB 13.3 06/30/2013 0828   HGB 12.8* 01/03/2013 1104   HGB 11.5* 11/29/2011 1032   HCT 39.2 06/30/2013 0828   HCT 41.4* 01/03/2013 1104   HCT 37.0* 11/29/2011 1032   PLT 136* 06/30/2013 0828   PLT 224.0 11/29/2011 1032   MCV 85.0 06/30/2013 0828   MCV 85.5 01/03/2013 1104   MCV 75.9* 11/29/2011 1032   MCH 28.9 06/30/2013 0828   MCH 26.4* 01/03/2013 1104   MCH 21.7* 06/03/2011 0535   MCHC 33.9 06/30/2013 0828   MCHC 30.9* 01/03/2013 1104   MCHC 31.2 11/29/2011 1032   RDW 14.4 06/30/2013 0828   RDW 18.1* 11/29/2011 1032   LYMPHSABS 0.9 06/30/2013 0828   LYMPHSABS 0.9 11/29/2011 1032   MONOABS 0.7 06/30/2013 0828   MONOABS 1.0 11/29/2011 1032   EOSABS 0.1 06/30/2013 0828   EOSABS 0.1 11/29/2011 1032   BASOSABS 0.0 06/30/2013 0828   BASOSABS 0.0 11/29/2011 1032     Chemistry      Component Value Date/Time   NA 140 04/07/2013 1306   NA 140 01/03/2013 1039   K 4.0 04/07/2013 1306   K 4.3 01/03/2013 1039   CL 108 01/03/2013 1039   CL 108* 09/26/2012 0928   CO2 24 04/07/2013 1306   CO2 23 01/03/2013 1039   BUN 9.2 04/07/2013 1306   BUN 12 01/03/2013 1039   CREATININE 1.1 04/07/2013 1306   CREATININE 1.22 01/03/2013 1039   CREATININE 1.23 12/21/2011 1019      Component Value Date/Time   CALCIUM 9.1 04/07/2013 1306   CALCIUM 9.1 01/03/2013 1039   ALKPHOS 61 09/26/2012 0928   ALKPHOS 56 12/21/2011 1019   AST 22 09/26/2012 0928   AST 32 12/21/2011 1019   ALT 17 09/26/2012 0928   ALT 23 12/21/2011 1019   BILITOT 0.48 09/26/2012 0928   BILITOT 0.6 12/21/2011 1019    Serum iron 71, ferritin 43, done 06/30/2013   Radiological Studies: See discussion above .  Impression: #1. 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 Hemoglobin and iron studies are stable at this time. Plan: Continue intermittent monitoring. Continue oral iron replacement with Niferex 150 mg twice a day.  #2. Isolated asymptomatic  right axillary lymphadenopathy. Although my concern for malignancy is low, I would like to get a surgical opinion with respect to lymph node biopsy. I will make a referral to Gen. surgery.  #3. Subcentimeter pulmonary nodules. Remote smoking history. Radiologist recommends a 1 year interval followup study for July 2015.  #4. Sickle cell trait  #5. Essential hypertension  #6. 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

## 2013-07-20 ENCOUNTER — Telehealth: Payer: Self-pay | Admitting: Oncology

## 2013-07-20 NOTE — Telephone Encounter (Signed)
pt came by gave him a number to [facility], called them and left message for new patient coordinator

## 2013-08-03 ENCOUNTER — Ambulatory Visit (INDEPENDENT_AMBULATORY_CARE_PROVIDER_SITE_OTHER): Payer: Medicare Other | Admitting: Surgery

## 2013-08-03 ENCOUNTER — Encounter (INDEPENDENT_AMBULATORY_CARE_PROVIDER_SITE_OTHER): Payer: Self-pay | Admitting: Surgery

## 2013-08-03 VITALS — BP 118/62 | HR 92 | Resp 16 | Ht 74.0 in | Wt 215.4 lb

## 2013-08-03 DIAGNOSIS — R229 Localized swelling, mass and lump, unspecified: Secondary | ICD-10-CM

## 2013-08-03 DIAGNOSIS — R2231 Localized swelling, mass and lump, right upper limb: Secondary | ICD-10-CM

## 2013-08-03 NOTE — Progress Notes (Signed)
Patient ID: [name], male   DOB: 12/16/1940, 72 y.o.   MRN: [id number]  Chief Complaint  Patient presents with  . New Evaluation    eval rt axillary lymphadenopathy    HPI [name] is a 72 y.o. male.  Patient sent at the request of Dr. Granfortuna for mass in right axilla. This is been followed for a number of months was felt to be a lymph node. It is been stable in size. Chest CT of this was negative for any lymphadenopathy. It has persisted on physical examination. It is stable in size. It's been about 3 cm. Not tender. There is no drainage. HPI  Past Medical History  Diagnosis Date  . Anemia   . GERD (gastroesophageal reflux disease)   . Hypertension   . Hyperlipemia   . Gout   . Lymphadenopathy, axillary 04/07/2013    3 cm right axillary node    Past Surgical History  Procedure Laterality Date  . Appendectomy    . Hernia repair    . Enteroscopy  08/24/2011    Procedure: ENTEROSCOPY;  Surgeon: Robert D Kaplan, MD;  Location: [facility];  Service: Endoscopy;  Laterality: N/A;    Family History  Problem Relation Age of Onset  . Colon cancer Neg Hx     Social History History  Substance Use Topics  . Smoking status: Former Smoker  . Smokeless tobacco: Never Used  . Alcohol Use: Yes     Comment: Gin and beer every other day    No Known Allergies  Current Outpatient Prescriptions  Medication Sig Dispense Refill  . allopurinol (ZYLOPRIM) 100 MG tablet One tablet by mouth once daily       . atenolol (TENORMIN) 25 MG tablet One tablet by mouth once daily       . atorvastatin (LIPITOR) 20 MG tablet Take 20 mg by mouth daily.        . Cinnamon 500 MG capsule Take 500 mg by mouth daily.        . iron polysaccharides (NIFEREX) 150 MG capsule Take 150 mg by mouth daily.       . Multiple Vitamin (MULTIVITAMIN) tablet Take 1 tablet by mouth daily.         No current facility-administered medications for this visit.    Review of Systems Review of Systems   Constitutional: Negative for fever, chills and unexpected weight change.  HENT: Negative for congestion, hearing loss, sore throat, trouble swallowing and voice change.   Eyes: Negative for visual disturbance.  Respiratory: Negative for cough and wheezing.   Cardiovascular: Negative for chest pain, palpitations and leg swelling.  Gastrointestinal: Negative for nausea, vomiting, abdominal pain, diarrhea, constipation, blood in stool, abdominal distention, anal bleeding and rectal pain.  Genitourinary: Negative for hematuria and difficulty urinating.  Musculoskeletal: Negative for arthralgias.  Skin: Negative for rash and wound.  Neurological: Negative for seizures, syncope, weakness and headaches.  Hematological: Negative for adenopathy. Does not bruise/bleed easily.  Psychiatric/Behavioral: Negative for confusion.    Blood pressure 118/62, pulse 92, resp. rate 16, height 6' 2" (1.88 m), weight 215 lb 6.4 oz (97.705 kg).  Physical Exam Physical Exam  Constitutional: He is oriented to person, place, and time. He appears well-developed and well-nourished.  HENT:  Head: Normocephalic and atraumatic.  Eyes: Pupils are equal, round, and reactive to light. No scleral icterus.  Neck: Normal range of motion. Neck supple.  Cardiovascular: Normal rate and regular rhythm.   Pulmonary/Chest: Effort normal and breath sounds   normal.  Abdominal: Soft.  Musculoskeletal: Normal range of motion.  Lymphadenopathy:    He has axillary adenopathy.       Right axillary: Lateral adenopathy present.  Vs lipoma 3 cm mobile  Neurological: He is alert and oriented to person, place, and time.  Psychiatric: He has a normal mood and affect. His behavior is normal. Judgment and thought content normal.    Data Reviewed Chest CT and notes from oncology  Assessment    Right axillary mass 3 cm subcutaneous    Plan    This could be a lipoma versus a lymph node. Recommend excision at this point. Patient  would like to have removed as well. Needle biopsy another option as well. Patient wishes to proceed with excision though.The procedure has been discussed with the patient.  Alternative therapies have been discussed with the patient.  Operative risks include bleeding,  Infection,  Organ injury,  Nerve injury,  Blood vessel injury,  DVT,  Pulmonary embolism,  Death,  And possible reoperation.  Medical management risks include worsening of present situation.  The success of the procedure is 50 -90 % at treating patients symptoms.  The patient understands and agrees to proceed.       Aziah Brostrom A. 08/03/2013, 3:11 PM    

## 2013-08-03 NOTE — Patient Instructions (Signed)

## 2013-08-14 ENCOUNTER — Other Ambulatory Visit (HOSPITAL_BASED_OUTPATIENT_CLINIC_OR_DEPARTMENT_OTHER): Payer: Medicare Other

## 2013-08-14 ENCOUNTER — Encounter (INDEPENDENT_AMBULATORY_CARE_PROVIDER_SITE_OTHER): Payer: Self-pay

## 2013-08-14 DIAGNOSIS — D472 Monoclonal gammopathy: Secondary | ICD-10-CM

## 2013-08-14 DIAGNOSIS — R599 Enlarged lymph nodes, unspecified: Secondary | ICD-10-CM

## 2013-08-14 DIAGNOSIS — R590 Localized enlarged lymph nodes: Secondary | ICD-10-CM

## 2013-08-14 LAB — CBC WITH DIFFERENTIAL/PLATELET
Basophils Absolute: 0 10*3/uL (ref 0.0–0.1)
Eosinophils Absolute: 0.1 10*3/uL (ref 0.0–0.5)
HGB: 12.5 g/dL — ABNORMAL LOW (ref 13.0–17.1)
LYMPH%: 19.5 % (ref 14.0–49.0)
MCV: 84.9 fL (ref 79.3–98.0)
MONO#: 0.8 10*3/uL (ref 0.1–0.9)
MONO%: 18.5 % — ABNORMAL HIGH (ref 0.0–14.0)
NEUT#: 2.5 10*3/uL (ref 1.5–6.5)
Platelets: 174 10*3/uL (ref 140–400)
RDW: 13.9 % (ref 11.0–14.6)

## 2013-08-17 ENCOUNTER — Ambulatory Visit (HOSPITAL_BASED_OUTPATIENT_CLINIC_OR_DEPARTMENT_OTHER): Payer: Medicare Other | Admitting: Oncology

## 2013-08-17 VITALS — BP 138/84 | HR 18 | Temp 97.3°F | Resp 20 | Ht 74.0 in | Wt 215.0 lb

## 2013-08-17 DIAGNOSIS — I10 Essential (primary) hypertension: Secondary | ICD-10-CM

## 2013-08-17 DIAGNOSIS — D573 Sickle-cell trait: Secondary | ICD-10-CM

## 2013-08-17 DIAGNOSIS — D472 Monoclonal gammopathy: Secondary | ICD-10-CM

## 2013-08-17 DIAGNOSIS — R590 Localized enlarged lymph nodes: Secondary | ICD-10-CM

## 2013-08-17 DIAGNOSIS — D500 Iron deficiency anemia secondary to blood loss (chronic): Secondary | ICD-10-CM

## 2013-08-17 DIAGNOSIS — R599 Enlarged lymph nodes, unspecified: Secondary | ICD-10-CM

## 2013-08-17 DIAGNOSIS — Q2733 Arteriovenous malformation of digestive system vessel: Secondary | ICD-10-CM

## 2013-08-17 DIAGNOSIS — F17200 Nicotine dependence, unspecified, uncomplicated: Secondary | ICD-10-CM

## 2013-08-17 DIAGNOSIS — D509 Iron deficiency anemia, unspecified: Secondary | ICD-10-CM

## 2013-08-18 ENCOUNTER — Telehealth: Payer: Self-pay | Admitting: Oncology

## 2013-08-18 LAB — IMMUNOFIXATION ELECTROPHORESIS
IgG (Immunoglobin G), Serum: 1360 mg/dL (ref 650–1600)
IgM, Serum: 78 mg/dL (ref 41–251)

## 2013-08-18 NOTE — Telephone Encounter (Signed)
S.W. PT AND ADVISED ON FEB APPT....PT OK AND AWARE

## 2013-08-18 NOTE — Progress Notes (Signed)
Hematology and Oncology Follow Up Visit  [name] [id number] 22-Nov-1940 72 y.o. 08/18/2013 9:29 AM   Principle Diagnosis: Encounter Diagnoses  Name Primary?  . ARTERIOVENOUS MALFORMATION, COLON Yes  . Unspecified iron deficiency anemia   . Monoclonal gammopathy of undetermined significance   . Lymphadenopathy, axillary      Interim History:  Followup visit for this pleasant 72 year old man initially referred to this office for evaluation of iron deficiency anemia. 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 Please see my office summary note dated 04/07/2013 for complete details of his previous anemia evaluation. He has required periodic iron infusions.  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 He was also found to have positive sickle trait. No evidence for any hemolytic process.  At the time of my examination on 04/07/2013, I felt an approximate 3 cm node in his right axilla. No other areas of adenopathy. I obtained a CT scan of the chest abdomen and pelvis done on July 31. The node that I was able to palpate clinically is not visible on the CT scan. There are no other significant areas of adenopathy or organomegaly. He has a few tiny subcentimeter nodules in his lungs and some calcified pleural plaquing. He denies any asbestos exposure. He stopped smoking 20 years ago. I have referred him for a lymph node biopsy. He was evaluated by Dr. Harriette Bouillon. Biopsy is scheduled for December 17. He has had no other interim medical problems.   Medications: reviewed  Allergies: No Known Allergies  Review of Systems:  ENT ROS: No sore throat  Respiratory ROS: No cough or dyspnea Cardiovascular ROS:   No chest pain or palpitations Gastrointestinal ROS:   No abdominal pain. No hematochezia or  melena Genito-Urinary ROS: No hematuria Musculoskeletal ROS: No muscle or bone pain Neurological ROS: No headache or change in vision Dermatological ROS: No rash Remaining ROS negative  Physical Exam: Blood pressure 138/84, pulse 18, temperature 97.3 F (36.3 C), temperature source Oral, resp. rate 20, height 6\' 2"  (1.88 m), weight 215 lb (97.523 kg). Wt Readings from Last 3 Encounters:  08/17/13 215 lb (97.523 kg)  08/03/13 215 lb 6.4 oz (97.705 kg)  07/07/13 213 lb 12.8 oz (96.979 kg)     General appearance: Tall, well-nourished, African American man HENNT: Pharynx no erythema, exudate, mass, or ulcer. No thyromegaly or thyroid nodules Lymph nodes: No cervical, supraclavicular,lymphadenopathy. Persistent isolated 3 cm right axillary lymph node Breasts:  Lungs: Clear to auscultation, resonant to percussion throughout Heart: Regular rhythm, no murmur, no gallop, no rub, no click, no edema Abdomen: Soft, nontender, normal bowel sounds, no mass, no organomegaly Extremities: No edema, no calf tenderness Musculoskeletal: no joint deformities GU:  Vascular: Carotid pulses  Neurologic: Alert, oriented, PERRLA,  , cranial nerves grossly normal, motor strength 5 over 5, reflexes 1+ symmetric, upper body coordination normal, gait normal, Skin: No rash or ecchymosis  Lab Results: CBC W/Diff    Component Value Date/Time   WBC 4.2 08/14/2013 0953   WBC 6.2 01/03/2013 1104   WBC 5.0 11/29/2011 1032   RBC 4.50 08/14/2013 0953   RBC 4.84 01/03/2013 1104   RBC 4.88 11/29/2011 1032   HGB 12.5* 08/14/2013 0953   HGB 12.8* 01/03/2013 1104   HGB 11.5* 11/29/2011 1032   HCT 38.2* 08/14/2013 0953   HCT 41.4*  01/03/2013 1104   HCT 37.0* 11/29/2011 1032   PLT 174 08/14/2013 0953   PLT 224.0 11/29/2011 1032   MCV 84.9 08/14/2013 0953   MCV 85.5 01/03/2013 1104   MCV 75.9* 11/29/2011 1032   MCH 27.8 08/14/2013 0953   MCH 26.4* 01/03/2013 1104   MCH 21.7* 06/03/2011 0535   MCHC 32.7 08/14/2013 0953    MCHC 30.9* 01/03/2013 1104   MCHC 31.2 11/29/2011 1032   RDW 13.9 08/14/2013 0953   RDW 18.1* 11/29/2011 1032   LYMPHSABS 0.8* 08/14/2013 0953   LYMPHSABS 0.9 11/29/2011 1032   MONOABS 0.8 08/14/2013 0953   MONOABS 1.0 11/29/2011 1032   EOSABS 0.1 08/14/2013 0953   EOSABS 0.1 11/29/2011 1032   BASOSABS 0.0 08/14/2013 0953   BASOSABS 0.0 11/29/2011 1032     Chemistry      Component Value Date/Time   NA 140 04/07/2013 1306   NA 140 01/03/2013 1039   K 4.0 04/07/2013 1306   K 4.3 01/03/2013 1039   CL 108 01/03/2013 1039   CL 108* 09/26/2012 0928   CO2 24 04/07/2013 1306   CO2 23 01/03/2013 1039   BUN 9.2 04/07/2013 1306   BUN 12 01/03/2013 1039   CREATININE 1.1 04/07/2013 1306   CREATININE 1.22 01/03/2013 1039   CREATININE 1.23 12/21/2011 1019      Component Value Date/Time   CALCIUM 9.1 04/07/2013 1306   CALCIUM 9.1 01/03/2013 1039   ALKPHOS 61 09/26/2012 0928   ALKPHOS 56 12/21/2011 1019   AST 22 09/26/2012 0928   AST 32 12/21/2011 1019   ALT 17 09/26/2012 0928   ALT 23 12/21/2011 1019   BILITOT 0.48 09/26/2012 0928   BILITOT 0.6 12/21/2011 1019        Impression:   #1. 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  Hemoglobin and iron studies are stable at this time.  Plan: Continue intermittent monitoring. Continue oral iron replacement with Niferex 150 mg twice a day.  #2. Isolated asymptomatic right axillary lymphadenopathy.  4 lymph node biopsy 09/02/2013.  #3. Subcentimeter pulmonary nodules.  Remote smoking history. Radiologist recommends a 1 year interval followup study for July 2015.  #4. Sickle cell trait  #5. Essential hypertension  #6. 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

## 2013-08-27 ENCOUNTER — Encounter (HOSPITAL_BASED_OUTPATIENT_CLINIC_OR_DEPARTMENT_OTHER): Payer: Self-pay | Admitting: *Deleted

## 2013-08-27 NOTE — Progress Notes (Signed)
To come in for [id number]-had cbc done 08/14/13 at hematology

## 2013-08-28 ENCOUNTER — Encounter (HOSPITAL_BASED_OUTPATIENT_CLINIC_OR_DEPARTMENT_OTHER)
Admission: RE | Admit: 2013-08-28 | Discharge: 2013-08-28 | Disposition: A | Discharge status: Home / Self Care | Payer: Medicare Other | Source: Ambulatory Visit | Attending: Surgery | Admitting: Surgery

## 2013-08-28 DIAGNOSIS — Z01818 Encounter for other preprocedural examination: Secondary | ICD-10-CM | POA: Insufficient documentation

## 2013-08-28 DIAGNOSIS — Z01810 Encounter for preprocedural cardiovascular examination: Secondary | ICD-10-CM | POA: Insufficient documentation

## 2013-08-28 DIAGNOSIS — Z01812 Encounter for preprocedural laboratory examination: Secondary | ICD-10-CM | POA: Insufficient documentation

## 2013-08-28 LAB — BASIC METABOLIC PANEL
CO2: 27 mEq/L (ref 19–32)
Calcium: 9.1 mg/dL (ref 8.4–10.5)
GFR calc Af Amer: 74 mL/min — ABNORMAL LOW (ref >90)
Sodium: 144 mEq/L (ref 135–145)

## 2013-09-01 ENCOUNTER — Encounter (HOSPITAL_BASED_OUTPATIENT_CLINIC_OR_DEPARTMENT_OTHER): Payer: Self-pay | Admitting: *Deleted

## 2013-09-01 ENCOUNTER — Encounter (HOSPITAL_BASED_OUTPATIENT_CLINIC_OR_DEPARTMENT_OTHER): Payer: Medicare Other | Admitting: Anesthesiology

## 2013-09-01 ENCOUNTER — Encounter (HOSPITAL_BASED_OUTPATIENT_CLINIC_OR_DEPARTMENT_OTHER)
Admission: RE | Disposition: A | Discharge status: Home / Self Care | Payer: Self-pay | Source: Ambulatory Visit | Attending: Surgery

## 2013-09-01 ENCOUNTER — Ambulatory Visit (HOSPITAL_BASED_OUTPATIENT_CLINIC_OR_DEPARTMENT_OTHER): Payer: Medicare Other | Admitting: Anesthesiology

## 2013-09-01 ENCOUNTER — Ambulatory Visit (HOSPITAL_BASED_OUTPATIENT_CLINIC_OR_DEPARTMENT_OTHER)
Admission: RE | Admit: 2013-09-01 | Discharge: 2013-09-01 | Disposition: A | Discharge status: Home / Self Care | Payer: Medicare Other | Source: Ambulatory Visit | Attending: Surgery | Admitting: Surgery

## 2013-09-01 DIAGNOSIS — L538 Other specified erythematous conditions: Secondary | ICD-10-CM

## 2013-09-01 DIAGNOSIS — Z87891 Personal history of nicotine dependence: Secondary | ICD-10-CM | POA: Insufficient documentation

## 2013-09-01 DIAGNOSIS — Z01810 Encounter for preprocedural cardiovascular examination: Secondary | ICD-10-CM | POA: Insufficient documentation

## 2013-09-01 DIAGNOSIS — R590 Localized enlarged lymph nodes: Secondary | ICD-10-CM

## 2013-09-01 DIAGNOSIS — M109 Gout, unspecified: Secondary | ICD-10-CM | POA: Insufficient documentation

## 2013-09-01 DIAGNOSIS — Z79899 Other long term (current) drug therapy: Secondary | ICD-10-CM | POA: Insufficient documentation

## 2013-09-01 DIAGNOSIS — I889 Nonspecific lymphadenitis, unspecified: Secondary | ICD-10-CM | POA: Insufficient documentation

## 2013-09-01 DIAGNOSIS — E785 Hyperlipidemia, unspecified: Secondary | ICD-10-CM | POA: Insufficient documentation

## 2013-09-01 DIAGNOSIS — Z01812 Encounter for preprocedural laboratory examination: Secondary | ICD-10-CM | POA: Insufficient documentation

## 2013-09-01 DIAGNOSIS — I10 Essential (primary) hypertension: Secondary | ICD-10-CM | POA: Insufficient documentation

## 2013-09-01 DIAGNOSIS — D649 Anemia, unspecified: Secondary | ICD-10-CM | POA: Insufficient documentation

## 2013-09-01 DIAGNOSIS — K219 Gastro-esophageal reflux disease without esophagitis: Secondary | ICD-10-CM | POA: Insufficient documentation

## 2013-09-01 HISTORY — PX: MASS EXCISION: SHX2000

## 2013-09-01 HISTORY — DX: Presence of dental prosthetic device (complete) (partial): Z97.2

## 2013-09-01 HISTORY — DX: Presence of spectacles and contact lenses: Z97.3

## 2013-09-01 SURGERY — EXCISION MASS
Anesthesia: General | Site: Axilla | Laterality: Right

## 2013-09-01 MED ORDER — MIDAZOLAM HCL 2 MG/2ML IJ SOLN: 1.0000 mg | INTRAMUSCULAR | Status: DC | PRN

## 2013-09-01 MED ORDER — FENTANYL CITRATE 0.05 MG/ML IJ SOLN
INTRAMUSCULAR | Status: AC
  Filled 2013-09-01: qty 4

## 2013-09-01 MED ORDER — CHLORHEXIDINE GLUCONATE 4 % EX LIQD: 1.0000 "application " | Freq: Once | CUTANEOUS | Status: DC

## 2013-09-01 MED ORDER — FENTANYL CITRATE 0.05 MG/ML IJ SOLN
INTRAMUSCULAR | Status: DC | PRN
  Administered 2013-09-01 (×2): 50 ug via INTRAVENOUS
  Administered 2013-09-01: 100 ug via INTRAVENOUS

## 2013-09-01 MED ORDER — LIDOCAINE HCL (CARDIAC) 20 MG/ML IV SOLN
INTRAVENOUS | Status: DC | PRN
  Administered 2013-09-01: 100 mg via INTRAVENOUS

## 2013-09-01 MED ORDER — OXYCODONE HCL 5 MG/5ML PO SOLN: 5.0000 mg | Freq: Once | ORAL | Status: DC | PRN

## 2013-09-01 MED ORDER — FENTANYL CITRATE 0.05 MG/ML IJ SOLN: 50.0000 ug | INTRAMUSCULAR | Status: DC | PRN

## 2013-09-01 MED ORDER — LACTATED RINGERS IV SOLN
INTRAVENOUS | Status: DC
  Administered 2013-09-01 (×2): via INTRAVENOUS

## 2013-09-01 MED ORDER — PROPOFOL 10 MG/ML IV BOLUS
INTRAVENOUS | Status: DC | PRN
  Administered 2013-09-01: 200 mg via INTRAVENOUS
  Administered 2013-09-01: 100 mg via INTRAVENOUS

## 2013-09-01 MED ORDER — HYDROCODONE-ACETAMINOPHEN 5-325 MG PO TABS: 1.0000 | ORAL_TABLET | Freq: Four times a day (QID) | ORAL | Status: DC | PRN

## 2013-09-01 MED ORDER — OXYCODONE HCL 5 MG PO TABS: 5.0000 mg | ORAL_TABLET | Freq: Once | ORAL | Status: DC | PRN

## 2013-09-01 MED ORDER — DEXTROSE 5 % IV SOLN
3.0000 g | INTRAVENOUS | Status: AC
  Administered 2013-09-01: 2 g via INTRAVENOUS

## 2013-09-01 MED ORDER — ONDANSETRON HCL 4 MG/2ML IJ SOLN: 4.0000 mg | Freq: Once | INTRAMUSCULAR | Status: DC | PRN

## 2013-09-01 MED ORDER — DEXAMETHASONE SODIUM PHOSPHATE 4 MG/ML IJ SOLN
INTRAMUSCULAR | Status: DC | PRN
  Administered 2013-09-01: 10 mg via INTRAVENOUS

## 2013-09-01 MED ORDER — BUPIVACAINE-EPINEPHRINE 0.25% -1:200000 IJ SOLN
INTRAMUSCULAR | Status: DC | PRN
  Administered 2013-09-01: 20 mL

## 2013-09-01 MED ORDER — MIDAZOLAM HCL 2 MG/2ML IJ SOLN
INTRAMUSCULAR | Status: AC
  Filled 2013-09-01: qty 2

## 2013-09-01 MED ORDER — EPHEDRINE SULFATE 50 MG/ML IJ SOLN
INTRAMUSCULAR | Status: DC | PRN
  Administered 2013-09-01: 10 mg via INTRAVENOUS

## 2013-09-01 MED ORDER — HYDROMORPHONE HCL PF 1 MG/ML IJ SOLN: 0.2500 mg | INTRAMUSCULAR | Status: DC | PRN

## 2013-09-01 MED ORDER — CEFAZOLIN SODIUM-DEXTROSE 2-3 GM-% IV SOLR
INTRAVENOUS | Status: AC
  Filled 2013-09-01: qty 50

## 2013-09-01 MED ORDER — MIDAZOLAM HCL 5 MG/5ML IJ SOLN
INTRAMUSCULAR | Status: DC | PRN
  Administered 2013-09-01: 2 mg via INTRAVENOUS

## 2013-09-01 MED ORDER — ONDANSETRON HCL 4 MG/2ML IJ SOLN
INTRAMUSCULAR | Status: DC | PRN
  Administered 2013-09-01: 4 mg via INTRAVENOUS

## 2013-09-01 SURGICAL SUPPLY — 40 items
BENZOIN TINCTURE PRP APPL 2/3 (GAUZE/BANDAGES/DRESSINGS) IMPLANT
BLADE SURG 10 STRL SS (BLADE) IMPLANT
BLADE SURG 15 STRL LF DISP TIS (BLADE) ×1 IMPLANT
BLADE SURG 15 STRL SS (BLADE) ×1
CANISTER SUCT 1200ML W/VALVE (MISCELLANEOUS) IMPLANT
CHLORAPREP W/TINT 26ML (MISCELLANEOUS) ×2 IMPLANT
COVER MAYO STAND STRL (DRAPES) ×2 IMPLANT
COVER TABLE BACK 60X90 (DRAPES) ×2 IMPLANT
DECANTER SPIKE VIAL GLASS SM (MISCELLANEOUS) IMPLANT
DERMABOND ADVANCED (GAUZE/BANDAGES/DRESSINGS) ×1
DERMABOND ADVANCED .7 DNX12 (GAUZE/BANDAGES/DRESSINGS) ×1 IMPLANT
DRAPE PED LAPAROTOMY (DRAPES) ×2 IMPLANT
DRAPE UTILITY XL STRL (DRAPES) ×2 IMPLANT
ELECT COATED BLADE 2.86 ST (ELECTRODE) ×2 IMPLANT
ELECT REM PT RETURN 9FT ADLT (ELECTROSURGICAL) ×2
ELECTRODE REM PT RTRN 9FT ADLT (ELECTROSURGICAL) ×1 IMPLANT
GLOVE BIO SURGEON STRL SZ 6.5 (GLOVE) ×2 IMPLANT
GLOVE BIOGEL PI IND STRL 7.0 (GLOVE) ×1 IMPLANT
GLOVE BIOGEL PI IND STRL 8 (GLOVE) ×1 IMPLANT
GLOVE BIOGEL PI INDICATOR 7.0 (GLOVE) ×1
GLOVE BIOGEL PI INDICATOR 8 (GLOVE) ×1
GLOVE ECLIPSE 8.0 STRL XLNG CF (GLOVE) ×2 IMPLANT
GOWN PREVENTION PLUS XLARGE (GOWN DISPOSABLE) ×4 IMPLANT
NEEDLE HYPO 25X1 1.5 SAFETY (NEEDLE) ×2 IMPLANT
NS IRRIG 1000ML POUR BTL (IV SOLUTION) ×2 IMPLANT
PACK BASIN DAY SURGERY FS (CUSTOM PROCEDURE TRAY) ×2 IMPLANT
PENCIL BUTTON HOLSTER BLD 10FT (ELECTRODE) ×2 IMPLANT
SLEEVE SCD COMPRESS KNEE MED (MISCELLANEOUS) ×2 IMPLANT
SPONGE LAP 4X18 X RAY DECT (DISPOSABLE) IMPLANT
STAPLER VISISTAT 35W (STAPLE) IMPLANT
STRIP CLOSURE SKIN 1/2X4 (GAUZE/BANDAGES/DRESSINGS) IMPLANT
SUT MON AB 4-0 PC3 18 (SUTURE) ×2 IMPLANT
SUT VIC AB 3-0 SH 27 (SUTURE) ×1
SUT VIC AB 3-0 SH 27X BRD (SUTURE) ×1 IMPLANT
SUT VICRYL AB 3 0 TIES (SUTURE) IMPLANT
SYR CONTROL 10ML LL (SYRINGE) ×2 IMPLANT
TOWEL OR 17X24 6PK STRL BLUE (TOWEL DISPOSABLE) ×4 IMPLANT
TOWEL OR NON WOVEN STRL DISP B (DISPOSABLE) ×2 IMPLANT
TUBE CONNECTING 20X1/4 (TUBING) IMPLANT
YANKAUER SUCT BULB TIP NO VENT (SUCTIONS) IMPLANT

## 2013-09-01 NOTE — Transfer of Care (Signed)
Immediate Anesthesia Transfer of Care Note  Patient: [name]  Procedure(s) Performed: Procedure(s): EXCISION AXILLA  MASS (Right)  Patient Location: PACU  Anesthesia Type:General  Level of Consciousness: awake  Airway & Oxygen Therapy: Patient Spontanous Breathing and Patient connected to face mask oxygen  Post-op Assessment: Report given to PACU RN and Post -op Vital signs reviewed and stable  Post vital signs: Reviewed and stable  Complications: No apparent anesthesia complications

## 2013-09-01 NOTE — H&P (View-Only) (Signed)
Patient ID: [name], male   DOB: 1941-03-11, 72 y.o.   MRN: [id number]  Chief Complaint  Patient presents with  . New Evaluation    eval rt axillary lymphadenopathy    HPI [name] is a 72 y.o. male.  Patient sent at the request of Dr. Cyndie Chime for mass in right axilla. This is been followed for a number of months was felt to be a lymph node. It is been stable in size. Chest CT of this was negative for any lymphadenopathy. It has persisted on physical examination. It is stable in size. It's been about 3 cm. Not tender. There is no drainage. HPI  Past Medical History  Diagnosis Date  . Anemia   . GERD (gastroesophageal reflux disease)   . Hypertension   . Hyperlipemia   . Gout   . Lymphadenopathy, axillary 04/07/2013    3 cm right axillary node    Past Surgical History  Procedure Laterality Date  . Appendectomy    . Hernia repair    . Enteroscopy  08/24/2011    Procedure: ENTEROSCOPY;  Surgeon: Louis Meckel, MD;  Location: [facility];  Service: Endoscopy;  Laterality: N/A;    Family History  Problem Relation Age of Onset  . Colon cancer Neg Hx     Social History History  Substance Use Topics  . Smoking status: Former Games developer  . Smokeless tobacco: Never Used  . Alcohol Use: Yes     Comment: Gin and beer every other day    No Known Allergies  Current Outpatient Prescriptions  Medication Sig Dispense Refill  . allopurinol (ZYLOPRIM) 100 MG tablet One tablet by mouth once daily       . atenolol (TENORMIN) 25 MG tablet One tablet by mouth once daily       . atorvastatin (LIPITOR) 20 MG tablet Take 20 mg by mouth daily.        . Cinnamon 500 MG capsule Take 500 mg by mouth daily.        . iron polysaccharides (NIFEREX) 150 MG capsule Take 150 mg by mouth daily.       . Multiple Vitamin (MULTIVITAMIN) tablet Take 1 tablet by mouth daily.         No current facility-administered medications for this visit.    Review of Systems Review of Systems   Constitutional: Negative for fever, chills and unexpected weight change.  HENT: Negative for congestion, hearing loss, sore throat, trouble swallowing and voice change.   Eyes: Negative for visual disturbance.  Respiratory: Negative for cough and wheezing.   Cardiovascular: Negative for chest pain, palpitations and leg swelling.  Gastrointestinal: Negative for nausea, vomiting, abdominal pain, diarrhea, constipation, blood in stool, abdominal distention, anal bleeding and rectal pain.  Genitourinary: Negative for hematuria and difficulty urinating.  Musculoskeletal: Negative for arthralgias.  Skin: Negative for rash and wound.  Neurological: Negative for seizures, syncope, weakness and headaches.  Hematological: Negative for adenopathy. Does not bruise/bleed easily.  Psychiatric/Behavioral: Negative for confusion.    Blood pressure 118/62, pulse 92, resp. rate 16, height 6\' 2"  (1.88 m), weight 215 lb 6.4 oz (97.705 kg).  Physical Exam Physical Exam  Constitutional: He is oriented to person, place, and time. He appears well-developed and well-nourished.  HENT:  Head: Normocephalic and atraumatic.  Eyes: Pupils are equal, round, and reactive to light. No scleral icterus.  Neck: Normal range of motion. Neck supple.  Cardiovascular: Normal rate and regular rhythm.   Pulmonary/Chest: Effort normal and breath sounds  normal.  Abdominal: Soft.  Musculoskeletal: Normal range of motion.  Lymphadenopathy:    He has axillary adenopathy.       Right axillary: Lateral adenopathy present.  Vs lipoma 3 cm mobile  Neurological: He is alert and oriented to person, place, and time.  Psychiatric: He has a normal mood and affect. His behavior is normal. Judgment and thought content normal.    Data Reviewed Chest CT and notes from oncology  Assessment    Right axillary mass 3 cm subcutaneous    Plan    This could be a lipoma versus a lymph node. Recommend excision at this point. Patient  would like to have removed as well. Needle biopsy another option as well. Patient wishes to proceed with excision though.The procedure has been discussed with the patient.  Alternative therapies have been discussed with the patient.  Operative risks include bleeding,  Infection,  Organ injury,  Nerve injury,  Blood vessel injury,  DVT,  Pulmonary embolism,  Death,  And possible reoperation.  Medical management risks include worsening of present situation.  The success of the procedure is 50 -90 % at treating patients symptoms.  The patient understands and agrees to proceed.       Lorien Shingler A. 08/03/2013, 3:11 PM

## 2013-09-01 NOTE — Anesthesia Postprocedure Evaluation (Signed)
  Anesthesia Post-op Note  Patient: [name]  Procedure(s) Performed: Procedure(s): EXCISION AXILLA  MASS (Right)  Patient Location: PACU  Anesthesia Type:General  Level of Consciousness: awake, alert  and oriented  Airway and Oxygen Therapy: Patient Spontanous Breathing  Post-op Pain: mild  Post-op Assessment: Post-op Vital signs reviewed  Post-op Vital Signs: Reviewed  Complications: No apparent anesthesia complications

## 2013-09-01 NOTE — Interval H&P Note (Signed)
History and Physical Interval Note:  09/01/2013 7:04 AM  [name]  has presented today for surgery, with the diagnosis of axillary mass  The various methods of treatment have been discussed with the patient and family. After consideration of risks, benefits and other options for treatment, the patient has consented to  Procedure(s): EXCISION AXILLA  MASS (Right) as a surgical intervention .  The patient's history has been reviewed, patient examined, no change in status, stable for surgery.  I have reviewed the patient's chart and labs.  Questions were answered to the patient's satisfaction.     Anayely Constantine A.

## 2013-09-01 NOTE — Anesthesia Procedure Notes (Signed)
Procedure Name: LMA Insertion Date/Time: 09/01/2013 7:24 AM Performed by: Caren Macadam Pre-anesthesia Checklist: Patient identified, Emergency Drugs available, Suction available and Patient being monitored Patient Re-evaluated:Patient Re-evaluated prior to inductionOxygen Delivery Method: Circle System Utilized Preoxygenation: Pre-oxygenation with 100% oxygen Intubation Type: IV induction Ventilation: Mask ventilation without difficulty LMA: LMA inserted LMA Size: 5.0 Number of attempts: 1 Airway Equipment and Method: bite block Placement Confirmation: positive ETCO2 and breath sounds checked- equal and bilateral Tube secured with: Tape Dental Injury: Teeth and Oropharynx as per pre-operative assessment

## 2013-09-01 NOTE — Anesthesia Preprocedure Evaluation (Signed)
Anesthesia Evaluation  Patient identified by MRN, date of birth, ID band Patient awake    Reviewed: Allergy & Precautions, H&P , NPO status , Patient's Chart, lab work & pertinent test results, reviewed documented beta blocker date and time   Airway Mallampati: I TM Distance: >3 FB Neck ROM: Full    Dental  (+) Partial Lower, Partial Upper and Dental Advisory Given   Pulmonary former smoker,  breath sounds clear to auscultation        Cardiovascular hypertension, Pt. on medications and Pt. on home beta blockers Rhythm:Regular Rate:Normal     Neuro/Psych    GI/Hepatic GERD-  Medicated and Controlled,  Endo/Other    Renal/GU      Musculoskeletal   Abdominal   Peds  Hematology   Anesthesia Other Findings   Reproductive/Obstetrics                           Anesthesia Physical Anesthesia Plan  ASA: II  Anesthesia Plan: General   Post-op Pain Management:    Induction: Intravenous  Airway Management Planned: LMA and Oral ETT  Additional Equipment:   Intra-op Plan:   Post-operative Plan: Extubation in OR  Informed Consent: I have reviewed the patients History and Physical, chart, labs and discussed the procedure including the risks, benefits and alternatives for the proposed anesthesia with the patient or authorized representative who has indicated his/her understanding and acceptance.   Dental advisory given  Plan Discussed with: CRNA, Anesthesiologist and Surgeon  Anesthesia Plan Comments:         Anesthesia Quick Evaluation

## 2013-09-01 NOTE — Brief Op Note (Signed)
09/01/2013  7:59 AM  PATIENT:  [name]  72 y.o. male  PRE-OPERATIVE DIAGNOSIS:  axillary mass  POST-OPERATIVE DIAGNOSIS:  axillary lymphadenopathy  PROCEDURE:right axillary lymph node excisional biopsy    SURGEON:  Surgeon(s) and Role:    * Dreonna Hussein A. Burgess Sheriff, MD - Primary   ANESTHESIA:   local and general  EBL:  Total I/O In: 1000 [I.V.:1000] Out: -   BLOOD ADMINISTERED:none  DRAINS: none   LOCAL MEDICATIONS USED:  BUPIVICAINE   SPECIMEN:  Source of Specimen:  right axilla LN  DISPOSITION OF SPECIMEN:  PATHOLOGY  COUNTS:  YES  TOURNIQUET:  * No tourniquets in log *  DICTATION: .Other Dictation: Dictation Number [id number]  PLAN OF CARE: Discharge to home after PACU  PATIENT DISPOSITION:  PACU - hemodynamically stable.   Delay start of Pharmacological VTE agent (>24hrs) due to surgical blood loss or risk of bleeding: not applicable

## 2013-09-02 NOTE — Op Note (Signed)
NAMECADIN, LUKA NO.:  [id number]  MEDICAL RECORD NO.:  [id number]  LOCATION:                                 FACILITY:  PHYSICIAN:  Shardae Kleinman A. Emerly Prak, M.D.DATE OF BIRTH:  12-Apr-1941  DATE OF PROCEDURE:  09/01/2013 DATE OF DISCHARGE:                              OPERATIVE REPORT   PREOPERATIVE DIAGNOSIS:  Right axillary mass.  POSTOPERATIVE DIAGNOSIS:  Right axillary lymphadenopathy.  PROCEDURE:  Excisional biopsy deep, right axillary lymph node.  SURGEON:  Maisie Fus A. Jahson Emanuele, M.D.  ANESTHESIA:  General LMA with 0.25% Sensorcaine local.  EBL:  Minimal.  IV FLUIDS:  A L crystalloid.  SPECIMEN:  Right axillary lymph node to pathology, enlarged of approximately 2-3 cm in size.  DRAINS:  None.  INDICATIONS FOR PROCEDURE:  The patient is a 72 year old male who was found to have a mass in his right axilla.  This was sent to me for evaluation and felt to be either a lymph node or a lipoma.  It is about 3 cm.  He was concerned and wished to have this removed.  Risks, benefits, and alternative therapies discussed with the patient, which were outlined in my history and physical.  He wished to proceed.  DESCRIPTION OF PROCEDURE:  The patient was met in the holding area and the right axilla was marked.  Questions were answered.  He was then taken back to the operating room, placed supine on the OR table.  After induction of general anesthesia, the right axillary region was prepped and draped in sterile fashion.  Time-out was done.  A 0.25% Sensorcaine local with epinephrine was used for local anesthesia.  This was injected in the skin of the right axilla.  Transverse incision was made just inferior to the hairline of the right axilla.  Dissection was carried down and the mass was palpated and felt to be about 3 cm.  This was in the deep axillary contents and upon examination, appeared to be a lymph node.  It was reexcised in its entirety.  It was sent to  pathology. Hemostasis was achieved.  We closed the deep layer of the axilla with 3- 0 Vicryl and 4-0 Monocryl was used to close the skin in subcuticular fashion.  This is a level I lymph node.  All final counts of sponge, needle, instruments found to be correct this portion of the case.  Dermabond applied.  The patient was awoken, extubated, taken to recovery in satisfactory condition.     Kathrynn Backstrom A. Jordynn Marcella, M.D.     TAC/MEDQ  D:  09/01/2013  T:  09/02/2013  Job:  [id number]

## 2013-09-03 ENCOUNTER — Encounter (HOSPITAL_BASED_OUTPATIENT_CLINIC_OR_DEPARTMENT_OTHER): Payer: Self-pay | Admitting: Surgery

## 2013-09-04 ENCOUNTER — Telehealth (INDEPENDENT_AMBULATORY_CARE_PROVIDER_SITE_OTHER): Payer: Self-pay

## 2013-09-04 NOTE — Telephone Encounter (Signed)
Called patient with benign path results.

## 2013-09-04 NOTE — Telephone Encounter (Signed)
Message copied by Brennan Bailey on Fri Sep 04, 2013 11:38 AM ------      Message from: Harriette Bouillon A      Created: Wed Sep 02, 2013  2:29 PM       [id number] ------

## 2013-09-25 ENCOUNTER — Encounter (INDEPENDENT_AMBULATORY_CARE_PROVIDER_SITE_OTHER): Payer: Self-pay | Admitting: Surgery

## 2013-09-25 ENCOUNTER — Ambulatory Visit (INDEPENDENT_AMBULATORY_CARE_PROVIDER_SITE_OTHER): Payer: Medicare Other | Admitting: Surgery

## 2013-09-25 VITALS — BP 140/88 | HR 96 | Temp 98.4°F | Resp 15 | Ht 74.0 in | Wt 216.2 lb

## 2013-09-25 DIAGNOSIS — Z9889 Other specified postprocedural states: Secondary | ICD-10-CM

## 2013-09-25 NOTE — Patient Instructions (Signed)
Resume full activity  

## 2013-09-25 NOTE — Progress Notes (Signed)
Patient returns lymph node biopsy right axilla. He has no complaints. Pathology showed Lymph node, biopsy, Right axillary - DERMATOPATHIC LYMPHADENITIS. - NO MORPHOLOGIC EVIDENCE OF LYMPHOMA OR MALIGNANCY  Exam: Right easily clean dry and intact with minimal swelling. Small seroma. No redness.  Impression: Status post right axillary lymph node biopsy  Plan: Return as needed. Resume full activity.  Follow up with oncology next month.

## 2013-10-15 ENCOUNTER — Telehealth: Payer: Self-pay | Admitting: *Deleted

## 2013-10-15 NOTE — Telephone Encounter (Signed)
sw pt informed him that G will be on call 10/26/13. gv appt for 10/28/13 w/ labs@1 :30p and ov@2p . Pt is aware...td

## 2013-10-26 ENCOUNTER — Ambulatory Visit: Payer: Medicare Other | Admitting: Oncology

## 2013-10-26 ENCOUNTER — Other Ambulatory Visit: Payer: Medicare Other

## 2013-10-28 ENCOUNTER — Telehealth: Payer: Self-pay | Admitting: Oncology

## 2013-10-28 ENCOUNTER — Other Ambulatory Visit (HOSPITAL_BASED_OUTPATIENT_CLINIC_OR_DEPARTMENT_OTHER): Payer: Medicare Other

## 2013-10-28 ENCOUNTER — Ambulatory Visit (HOSPITAL_BASED_OUTPATIENT_CLINIC_OR_DEPARTMENT_OTHER): Payer: Medicare Other | Admitting: Oncology

## 2013-10-28 VITALS — BP 145/67 | HR 89 | Temp 97.1°F | Resp 18 | Ht 74.0 in | Wt 219.6 lb

## 2013-10-28 DIAGNOSIS — R911 Solitary pulmonary nodule: Secondary | ICD-10-CM

## 2013-10-28 DIAGNOSIS — D500 Iron deficiency anemia secondary to blood loss (chronic): Secondary | ICD-10-CM

## 2013-10-28 DIAGNOSIS — R599 Enlarged lymph nodes, unspecified: Secondary | ICD-10-CM

## 2013-10-28 DIAGNOSIS — D509 Iron deficiency anemia, unspecified: Secondary | ICD-10-CM

## 2013-10-28 DIAGNOSIS — R590 Localized enlarged lymph nodes: Secondary | ICD-10-CM

## 2013-10-28 DIAGNOSIS — I10 Essential (primary) hypertension: Secondary | ICD-10-CM

## 2013-10-28 DIAGNOSIS — D472 Monoclonal gammopathy: Secondary | ICD-10-CM

## 2013-10-28 DIAGNOSIS — E786 Lipoprotein deficiency: Secondary | ICD-10-CM

## 2013-10-28 DIAGNOSIS — Q2733 Arteriovenous malformation of digestive system vessel: Secondary | ICD-10-CM

## 2013-10-28 LAB — CBC WITH DIFFERENTIAL/PLATELET
BASO%: 0.8 % (ref 0.0–2.0)
Basophils Absolute: 0 10*3/uL (ref 0.0–0.1)
EOS ABS: 0.1 10*3/uL (ref 0.0–0.5)
EOS%: 2.5 % (ref 0.0–7.0)
HCT: 36.3 % — ABNORMAL LOW (ref 38.4–49.9)
HGB: 11.6 g/dL — ABNORMAL LOW (ref 13.0–17.1)
LYMPH%: 19.7 % (ref 14.0–49.0)
MCH: 25.7 pg — ABNORMAL LOW (ref 27.2–33.4)
MCHC: 31.9 g/dL — ABNORMAL LOW (ref 32.0–36.0)
MCV: 80.8 fL (ref 79.3–98.0)
MONO#: 0.9 10*3/uL (ref 0.1–0.9)
MONO%: 21.6 % — ABNORMAL HIGH (ref 0.0–14.0)
NEUT%: 55.4 % (ref 39.0–75.0)
NEUTROS ABS: 2.2 10*3/uL (ref 1.5–6.5)
PLATELETS: 231 10*3/uL (ref 140–400)
RBC: 4.5 10*6/uL (ref 4.20–5.82)
RDW: 15.8 % — AB (ref 11.0–14.6)
WBC: 3.9 10*3/uL — ABNORMAL LOW (ref 4.0–10.3)
lymph#: 0.8 10*3/uL — ABNORMAL LOW (ref 0.9–3.3)

## 2013-10-28 LAB — IRON AND TIBC CHCC
%SAT: UNDETERMINED % (ref 20–?)
IRON: 362 ug/dL — AB (ref 42–163)
TIBC: 341 ug/dL (ref 202–409)
UIBC: UNDETERMINED ug/dL (ref 117–376)

## 2013-10-28 LAB — FERRITIN CHCC: FERRITIN: 27 ng/mL (ref 22–316)

## 2013-10-28 NOTE — Telephone Encounter (Signed)
Gave pt appt for lab and ML for March , sent to to labs today

## 2013-10-28 NOTE — Progress Notes (Signed)
Hematology and Oncology Follow Up Visit  [name] [id number] 06-23-1941 73 y.o. 10/28/2013 6:11 PM   Principle Diagnosis: Encounter Diagnoses  Name Primary?  . Monoclonal gammopathy of undetermined significance   . Lymphadenopathy, axillary   . Iron deficiency anemia secondary to blood loss (chronic) Yes     Interim History:   Brief followup visit to review results of recent left axillary lymph node biopsy which fortunately came back negative for malignancy. Findings consistent with a dermatopathic lymphadenitis. No evidence for lymphoma. (09/01/2013). Reviewing today's lab data, hemoglobin and MCV drifting down again. He is taking one iron tablet daily sometimes 2. White count also down from his baseline. He has not started any new medications.  Iron studies return after the patient left the office and show serum iron 362 but ferritin only 27. My guess is that lab was drawn shortly after he took an oral iron tablet. Most recent ferritin prior to day on 06/30/2013 was 43. He denies any hematochezia or melena. 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  Medications: reviewed  Allergies: No Known Allergies  Review of Systems:   Physical Exam: Blood pressure 145/67, pulse 89, temperature 97.1 F (36.2 C), temperature source Oral, resp. rate 18, height 6\' 2"  (1.88 m), weight 219 lb 9.6 oz (99.61 kg). Wt Readings from Last 3 Encounters:  10/28/13 219 lb 9.6 oz (99.61 kg)  09/25/13 216 lb 3.2 oz (98.068 kg)  09/01/13 215 lb (97.523 kg)    Patient not examined today Lab Results: CBC W/Diff    Component Value Date/Time   WBC 3.9* 10/28/2013 1332   WBC 6.2 01/03/2013 1104   WBC 5.0 11/29/2011 1032   RBC 4.50 10/28/2013 1332   RBC 4.84 01/03/2013 1104   RBC 4.88 11/29/2011 1032   HGB 11.6* 10/28/2013 1332   HGB 12.8* 01/03/2013 1104   HGB 11.5* 11/29/2011 1032   HCT 36.3* 10/28/2013 1332   HCT 41.4* 01/03/2013 1104   HCT 37.0* 11/29/2011 1032   PLT 231 10/28/2013 1332   PLT 224.0 11/29/2011  1032   MCV 80.8 10/28/2013 1332   MCV 85.5 01/03/2013 1104   MCV 75.9* 11/29/2011 1032   MCH 25.7* 10/28/2013 1332   MCH 26.4* 01/03/2013 1104   MCH 21.7* 06/03/2011 0535   MCHC 31.9* 10/28/2013 1332   MCHC 30.9* 01/03/2013 1104   MCHC 31.2 11/29/2011 1032   RDW 15.8* 10/28/2013 1332   RDW 18.1* 11/29/2011 1032   LYMPHSABS 0.8* 10/28/2013 1332   LYMPHSABS 0.9 11/29/2011 1032   MONOABS 0.9 10/28/2013 1332   MONOABS 1.0 11/29/2011 1032   EOSABS 0.1 10/28/2013 1332   EOSABS 0.1 11/29/2011 1032   BASOSABS 0.0 10/28/2013 1332   BASOSABS 0.0 11/29/2011 1032     Chemistry      Component Value Date/Time   NA 144 08/28/2013 1200   NA 140 04/07/2013 1306   K 4.4 08/28/2013 1200   K 4.0 04/07/2013 1306   CL 108 08/28/2013 1200   CL 108* 09/26/2012 0928   CO2 27 08/28/2013 1200   CO2 24 04/07/2013 1306   BUN 13 08/28/2013 1200   BUN 9.2 04/07/2013 1306   CREATININE 1.13 08/28/2013 1200   CREATININE 1.1 04/07/2013 1306   CREATININE 1.22 01/03/2013 1039      Component Value Date/Time   CALCIUM 9.1 08/28/2013 1200   CALCIUM 9.1 04/07/2013 1306   ALKPHOS 61 09/26/2012 0928   ALKPHOS 56 12/21/2011 1019   AST 22 09/26/2012 0928   AST 32 12/21/2011  1019   ALT 17 09/26/2012 0928   ALT 23 12/21/2011 1019   BILITOT 0.48 09/26/2012 0928   BILITOT 0.6 12/21/2011 1019        Impression:  #1. 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  No obvious clinical bleeding per his history. He is encouraged to resume Niferex 150 mg twice a day. Repeat iron studies again in 6 weeks.  #2. Isolated asymptomatic right axillary lymphadenopathy.   lymph node biopsy 09/02/2013 with reactive but no malignant changes. .  #3. Subcentimeter pulmonary nodules.  Remote smoking history. Radiologist recommends a 1 year interval followup study for July 2015.   #4. Sickle cell trait   #5. Essential hypertension   #6. 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 I might have him collect a 24-hour urine for total protein and immunofixation electrophoresis at this time to exclude remote possibility of light chain proteinuria.  He is uncertain as to whether he wants to follow me to [facility] internal medicine clinic or remain here at the cancer center. He'll make a final decision at time of his next visit here in 6 weeks. If he wants to stay at the [facility] I will transition his care to Dr. Alen Blew.  Approximate 20 minutes direct face-to-face encounter with this patient to review pathology reports, and laboratory data and coordinate care.     CC: Patient Care Team: Haywood Pao, MD as PCP - General (Internal Medicine)   Annia Belt, MD 2/11/20156:11 PM

## 2013-10-30 ENCOUNTER — Other Ambulatory Visit (HOSPITAL_BASED_OUTPATIENT_CLINIC_OR_DEPARTMENT_OTHER): Payer: Medicare Other

## 2013-10-30 DIAGNOSIS — D500 Iron deficiency anemia secondary to blood loss (chronic): Secondary | ICD-10-CM

## 2013-10-30 LAB — BASIC METABOLIC PANEL (CC13)
Anion Gap: 10 mEq/L (ref 3–11)
BUN: 8.2 mg/dL (ref 7.0–26.0)
CHLORIDE: 109 meq/L (ref 98–109)
CO2: 22 mEq/L (ref 22–29)
CREATININE: 1.1 mg/dL (ref 0.7–1.3)
Calcium: 9.2 mg/dL (ref 8.4–10.4)
Glucose: 87 mg/dl (ref 70–140)
Potassium: 3.7 mEq/L (ref 3.5–5.1)
Sodium: 141 mEq/L (ref 136–145)

## 2013-11-03 LAB — UIFE/LIGHT CHAINS/TP QN, 24-HR UR
ALPHA 2 UR: DETECTED — AB
Albumin, U: DETECTED
Alpha 1, Urine: DETECTED — AB
BETA UR: DETECTED — AB
FREE KAPPA LT CHAINS, UR: 9.2 mg/dL — AB (ref 0.14–2.42)
FREE LT CHN EXCR RATE: 128.8 mg/d
Free Kappa/Lambda Ratio: 4.82 ratio (ref 2.04–10.37)
Free Lambda Excretion/Day: 26.74 mg/d
Free Lambda Lt Chains,Ur: 1.91 mg/dL — ABNORMAL HIGH (ref 0.02–0.67)
Gamma Globulin, Urine: DETECTED — AB
Time: 24 hours
Total Protein, Urine-Ur/day: 162 mg/d — ABNORMAL HIGH (ref 10–140)
Total Protein, Urine: 11.6 mg/dL
Volume, Urine: 1400 mL

## 2013-11-04 ENCOUNTER — Telehealth: Payer: Self-pay | Admitting: *Deleted

## 2013-11-04 NOTE — Telephone Encounter (Signed)
Message copied by Jesse Fall on Wed Nov 04, 2013  9:59 AM ------      Message from: Annia Belt      Created: Tue Nov 03, 2013  5:43 PM       Call pt:  Slight elevation of urine protein but no need to be concerned ------

## 2013-11-04 NOTE — Telephone Encounter (Signed)
Message given to pt per Dr.Granfortuna's instructions below.

## 2013-11-15 ENCOUNTER — Encounter: Payer: Self-pay | Admitting: Oncology

## 2013-11-23 ENCOUNTER — Other Ambulatory Visit: Payer: Self-pay | Admitting: Physical Medicine and Rehabilitation

## 2013-11-23 ENCOUNTER — Ambulatory Visit
Admission: RE | Admit: 2013-11-23 | Discharge: 2013-11-23 | Disposition: A | Discharge status: Home / Self Care | Payer: Medicare Other | Source: Ambulatory Visit | Attending: Physical Medicine and Rehabilitation | Admitting: Physical Medicine and Rehabilitation

## 2013-11-23 DIAGNOSIS — M25552 Pain in left hip: Secondary | ICD-10-CM

## 2013-12-01 ENCOUNTER — Other Ambulatory Visit (HOSPITAL_COMMUNITY): Payer: Self-pay | Admitting: Orthopaedic Surgery

## 2013-12-02 ENCOUNTER — Telehealth: Payer: Self-pay | Admitting: Oncology

## 2013-12-02 ENCOUNTER — Ambulatory Visit (HOSPITAL_BASED_OUTPATIENT_CLINIC_OR_DEPARTMENT_OTHER): Payer: Medicare Other | Admitting: Nurse Practitioner

## 2013-12-02 ENCOUNTER — Other Ambulatory Visit (HOSPITAL_BASED_OUTPATIENT_CLINIC_OR_DEPARTMENT_OTHER): Payer: Medicare Other

## 2013-12-02 VITALS — BP 146/91 | HR 92 | Temp 97.5°F | Resp 18 | Ht 74.0 in | Wt 218.3 lb

## 2013-12-02 DIAGNOSIS — K922 Gastrointestinal hemorrhage, unspecified: Secondary | ICD-10-CM

## 2013-12-02 DIAGNOSIS — D500 Iron deficiency anemia secondary to blood loss (chronic): Secondary | ICD-10-CM

## 2013-12-02 DIAGNOSIS — R911 Solitary pulmonary nodule: Secondary | ICD-10-CM

## 2013-12-02 DIAGNOSIS — R590 Localized enlarged lymph nodes: Secondary | ICD-10-CM

## 2013-12-02 DIAGNOSIS — D472 Monoclonal gammopathy: Secondary | ICD-10-CM

## 2013-12-02 DIAGNOSIS — Q2733 Arteriovenous malformation of digestive system vessel: Secondary | ICD-10-CM

## 2013-12-02 DIAGNOSIS — D509 Iron deficiency anemia, unspecified: Secondary | ICD-10-CM

## 2013-12-02 DIAGNOSIS — I10 Essential (primary) hypertension: Secondary | ICD-10-CM

## 2013-12-02 DIAGNOSIS — D573 Sickle-cell trait: Secondary | ICD-10-CM

## 2013-12-02 LAB — IRON AND TIBC CHCC
%SAT: 11 % — ABNORMAL LOW (ref 20–55)
Iron: 40 ug/dL — ABNORMAL LOW (ref 42–163)
TIBC: 380 ug/dL (ref 202–409)
UIBC: 340 ug/dL (ref 117–376)

## 2013-12-02 LAB — CBC & DIFF AND RETIC
BASO%: 0.4 % (ref 0.0–2.0)
Basophils Absolute: 0 10*3/uL (ref 0.0–0.1)
EOS ABS: 0.1 10*3/uL (ref 0.0–0.5)
EOS%: 2.9 % (ref 0.0–7.0)
HEMATOCRIT: 36 % — AB (ref 38.4–49.9)
HGB: 11.7 g/dL — ABNORMAL LOW (ref 13.0–17.1)
Immature Retic Fract: 25.1 % — ABNORMAL HIGH (ref 3.00–10.60)
LYMPH%: 24.6 % (ref 14.0–49.0)
MCH: 25 pg — ABNORMAL LOW (ref 27.2–33.4)
MCHC: 32.5 g/dL (ref 32.0–36.0)
MCV: 76.9 fL — ABNORMAL LOW (ref 79.3–98.0)
MONO#: 0.9 10*3/uL (ref 0.1–0.9)
MONO%: 19.8 % — AB (ref 0.0–14.0)
NEUT#: 2.4 10*3/uL (ref 1.5–6.5)
NEUT%: 52.3 % (ref 39.0–75.0)
PLATELETS: 201 10*3/uL (ref 140–400)
RBC: 4.68 10*6/uL (ref 4.20–5.82)
RDW: 16.4 % — ABNORMAL HIGH (ref 11.0–14.6)
RETIC %: 2.44 % — AB (ref 0.80–1.80)
Retic Ct Abs: 114.19 10*3/uL — ABNORMAL HIGH (ref 34.80–93.90)
WBC: 4.6 10*3/uL (ref 4.0–10.3)
lymph#: 1.1 10*3/uL (ref 0.9–3.3)

## 2013-12-02 LAB — FERRITIN CHCC: Ferritin: 36 ng/mL (ref 22–316)

## 2013-12-02 NOTE — Telephone Encounter (Signed)
gv adn printed appt sched adn avs for pt for June....sent msg to MD/RH/self

## 2013-12-02 NOTE — Progress Notes (Signed)
OFFICE PROGRESS NOTE  Interval history:  Mr. [name] is a 73 year old man with chronic iron deficiency anemia due to GI blood loss from AVMs. He has required periodic iron infusions in the past. He takes oral iron twice daily.   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 24-hour UIFE/protein showed slight elevation of urine protein at 162 mg and was positive for monoclonal free lambda light chains.  He underwent right axillary lymph node biopsy on 09/01/2013 with findings of a dermatopathic lymphadenitis. There is no evidence of lymphoma or malignancy.  He was found to have subcentimeter pulmonary nodules on CT 04/16/2013. Radiologist recommended a one-year interval followup study.  He presents today for scheduled followup. He reports good compliance with oral iron. He denies any problems with constipation since increasing the iron. No hematochezia or melena. No nausea or vomiting. He feels well. No interim illnesses or infections. He denies any fevers or sweats.  Objective: Filed Vitals:   12/02/13 1410  BP: 146/91  Pulse: 92  Temp: 97.5 F (36.4 C)  Resp: 18   Oropharynx is without thrush or ulceration. Small, approximate 1 cm, right axillary lymph node. No palpable cervical, supraclavicular or left axillary lymph nodes. Lungs clear. Regular cardiac rhythm. Abdomen soft and nontender. No organomegaly. No leg edema. Calves nontender.   Lab Results: Lab Results  Component Value Date   WBC 4.6 12/02/2013   HGB 11.7* 12/02/2013   HCT 36.0* 12/02/2013   MCV 76.9* 12/02/2013   PLT 201 12/02/2013   NEUTROABS 2.4 12/02/2013    Chemistry:    Chemistry      Component Value Date/Time   NA 141 10/30/2013 0947   NA 144 08/28/2013 1200   K 3.7 10/30/2013 0947   K 4.4 08/28/2013 1200   CL 108 08/28/2013 1200   CL 108* 09/26/2012 0928   CO2 22 10/30/2013 0947   CO2 27 08/28/2013 1200   BUN 8.2 10/30/2013 0947   BUN 13 08/28/2013 1200   CREATININE 1.1 10/30/2013  0947   CREATININE 1.13 08/28/2013 1200   CREATININE 1.22 01/03/2013 1039      Component Value Date/Time   CALCIUM 9.2 10/30/2013 0947   CALCIUM 9.1 08/28/2013 1200   ALKPHOS 61 09/26/2012 0928   ALKPHOS 56 12/21/2011 1019   AST 22 09/26/2012 0928   AST 32 12/21/2011 1019   ALT 17 09/26/2012 0928   ALT 23 12/21/2011 1019   BILITOT 0.48 09/26/2012 0928   BILITOT 0.6 12/21/2011 1019       Studies/Results: Dg Hip Complete Left  11/23/2013   CLINICAL DATA:  Left hip pain with worsening.  EXAM: LEFT HIP - COMPLETE 2+ VIEW  COMPARISON:  None.  FINDINGS: There is sclerosis and lucency within the left femoral head. Slight cortical regularity is seen superiorly. Probable mild medial joint space narrowing.  IMPRESSION: Combination of left femoral head sclerosis, lucency and slight collapse is indicative of avascular necrosis.   Electronically Signed   By: Lorin Picket M.D.   On: 11/23/2013 12:57    Medications: I have reviewed the patient's current medications.  Assessment/Plan: 1. 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 Currently on Niferex 150 mg twice daily. 2. Right axillary lymphadenopathy status post biopsy 09/01/2013 with no evidence of malignancy. 3. Subcentimeter pulmonary nodules on CT 04/16/2013. Radiologist recommended one-year interval followup. 4. Sickle cell trait. 5.  Essential hypertension. 6. 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 Monoclonal immunoglobulin on immunofixation electrophoresis  only with no elevation of serum total immunoglobulins. 24-hour urine was slight elevation of urine protein.   Dispositon-hemoglobin is stable. He will continue oral iron. We will obtain repeat labs in 6 weeks and 3 months. He may be due for a colonoscopy and will contact Dr. Kelby Fam office to confirm.  We will refer him for the one-year interval followup chest CT in June/July of this year (high-resolution noncontrast as recommended  by the radiologist).  He would like to continue followup with Dr. Beryle Beams at [facility]. We will schedule a 6 month office visit.   Plan reviewed with Dr. Beryle Beams.   Ned Card ANP/GNP-BC

## 2013-12-04 LAB — IMMUNOFIXATION ELECTROPHORESIS
IGA: 352 mg/dL (ref 68–379)
IGG (IMMUNOGLOBIN G), SERUM: 1380 mg/dL (ref 650–1600)
IGM, SERUM: 75 mg/dL (ref 41–251)
Total Protein, Serum Electrophoresis: 7 g/dL (ref 6.0–8.3)

## 2013-12-07 ENCOUNTER — Telehealth: Payer: Self-pay | Admitting: *Deleted

## 2013-12-07 NOTE — Telephone Encounter (Signed)
Received phone call from patient stated he wants to talk to [place].  Patient stated he does not know why he is scheduled for a Chest CT on 06/17.  Spoke with Ned Card, NP and she stated the reason a CT scan was ordered because he was found to have subcentimeter pulmonary nodules on 04/16/13 & radiologist recommened a one-year interval follow-up study.  Called and informed patient of what NP stated.  Patient then stated he did not believe he needs a CT scan and will be calling to cancel radiology appointment. Patient verbalized understanding.

## 2013-12-10 ENCOUNTER — Encounter (HOSPITAL_COMMUNITY): Payer: Self-pay | Admitting: Pharmacy Technician

## 2013-12-16 ENCOUNTER — Encounter (HOSPITAL_COMMUNITY): Payer: Self-pay

## 2013-12-16 ENCOUNTER — Encounter (HOSPITAL_COMMUNITY)
Admission: RE | Admit: 2013-12-16 | Discharge: 2013-12-16 | Disposition: A | Discharge status: Home / Self Care | Payer: Medicare Other | Source: Ambulatory Visit | Attending: Orthopaedic Surgery | Admitting: Orthopaedic Surgery

## 2013-12-16 DIAGNOSIS — Z01812 Encounter for preprocedural laboratory examination: Secondary | ICD-10-CM | POA: Insufficient documentation

## 2013-12-16 HISTORY — DX: Sickle-cell trait: D57.3

## 2013-12-16 HISTORY — DX: Idiopathic aseptic necrosis of unspecified bone: M87.00

## 2013-12-16 HISTORY — DX: Abnormal findings on diagnostic imaging of other specified body structures: R93.89

## 2013-12-16 HISTORY — DX: Monoclonal gammopathy: D47.2

## 2013-12-16 HISTORY — DX: Angiodysplasia of colon without hemorrhage: K55.20

## 2013-12-16 HISTORY — DX: Low back pain: M54.5

## 2013-12-16 HISTORY — DX: Low back pain, unspecified: M54.50

## 2013-12-16 LAB — PROTIME-INR
INR: 0.96 (ref 0.00–1.49)
Prothrombin Time: 12.6 seconds (ref 11.6–15.2)

## 2013-12-16 LAB — URINALYSIS, ROUTINE W REFLEX MICROSCOPIC
Bilirubin Urine: NEGATIVE
Glucose, UA: NEGATIVE mg/dL
Hgb urine dipstick: NEGATIVE
KETONES UR: NEGATIVE mg/dL
Leukocytes, UA: NEGATIVE
NITRITE: NEGATIVE
Protein, ur: NEGATIVE mg/dL
Specific Gravity, Urine: 1.016 (ref 1.005–1.030)
UROBILINOGEN UA: 0.2 mg/dL (ref 0.0–1.0)
pH: 5.5 (ref 5.0–8.0)

## 2013-12-16 LAB — CBC
HCT: 36.2 % — ABNORMAL LOW (ref 39.0–52.0)
Hemoglobin: 11.8 g/dL — ABNORMAL LOW (ref 13.0–17.0)
MCH: 25.1 pg — ABNORMAL LOW (ref 26.0–34.0)
MCHC: 32.6 g/dL (ref 30.0–36.0)
MCV: 76.9 fL — ABNORMAL LOW (ref 78.0–100.0)
PLATELETS: 202 10*3/uL (ref 150–400)
RBC: 4.71 MIL/uL (ref 4.22–5.81)
RDW: 16.9 % — ABNORMAL HIGH (ref 11.5–15.5)
WBC: 4.2 10*3/uL (ref 4.0–10.5)

## 2013-12-16 LAB — BASIC METABOLIC PANEL
BUN: 9 mg/dL (ref 6–23)
CALCIUM: 9.3 mg/dL (ref 8.4–10.5)
CO2: 23 mEq/L (ref 19–32)
Chloride: 106 mEq/L (ref 96–112)
Creatinine, Ser: 1.15 mg/dL (ref 0.50–1.35)
GFR, EST AFRICAN AMERICAN: 73 mL/min — AB (ref >90)
GFR, EST NON AFRICAN AMERICAN: 63 mL/min — AB (ref >90)
GLUCOSE: 97 mg/dL (ref 70–99)
Potassium: 4.1 mEq/L (ref 3.7–5.3)
SODIUM: 140 meq/L (ref 137–147)

## 2013-12-16 LAB — SURGICAL PCR SCREEN
MRSA, PCR: INVALID — AB
Staphylococcus aureus: INVALID — AB

## 2013-12-16 LAB — APTT: aPTT: 33 seconds (ref 24–37)

## 2013-12-16 NOTE — Patient Instructions (Signed)
   YOUR SURGERY IS SCHEDULED AT [facility]  ON:  Friday  4/10  REPORT TO  SHORT STAY CENTER AT:  11:45 AM      PHONE # FOR SHORT STAY IS [phone]  DO NOT EAT ANYTHING AFTER MIDNIGHT THE NIGHT BEFORE YOUR SURGERY.  YOU MAY BRUSH YOUR TEETH.   NO FOOD, NO CHEWING GUM, NO MINTS, NO CANDIES, NO CHEWING TOBACCO. YOU MAY HAVE CLEAR LIQUIDS TO DRINK FROM MIDNIGHT NIGHT BEFORE SURGERY - UNTIL 8:15 AM DAY OF SURGERY - LIKE WATER, COFFEE ( NO MILK OR MILK PRODUCTS ), APPLE JUICE.     NOTHING TO DRINK AFTER 8:15 AM DAY OF SURGERY.  PLEASE TAKE THE FOLLOWING MEDICATIONS THE AM OF YOUR SURGERY WITH A FEW SIPS OF WATER:    ALLOPURINOL, ATENOLOL, RANITIDINE   DO NOT BRING VALUABLES, MONEY, CREDIT CARDS.  DO NOT WEAR JEWELRY, MAKE-UP, NAIL POLISH AND NO METAL PINS OR CLIPS IN YOUR HAIR. CONTACT LENS, DENTURES / PARTIALS, GLASSES SHOULD NOT BE WORN TO SURGERY AND IN MOST CASES-HEARING AIDS WILL NEED TO BE REMOVED.  BRING YOUR GLASSES CASE, ANY EQUIPMENT NEEDED FOR YOUR CONTACT LENS. FOR PATIENTS ADMITTED TO THE HOSPITAL--CHECK OUT TIME THE DAY OF DISCHARGE IS 11:00 AM.  ALL INPATIENT ROOMS ARE PRIVATE - WITH BATHROOM, TELEPHONE, TELEVISION AND WIFI INTERNET.                                                    PLEASE READ OVER ANY  FACT SHEETS THAT YOU WERE GIVEN: MRSA INFORMATION, BLOOD TRANSFUSION INFORMATION.  FAILURE TO FOLLOW THESE INSTRUCTIONS MAY RESULT IN THE CANCELLATION OF YOUR SURGERY. PLEASE BE AWARE THAT YOU MAY NEED ADDITIONAL BLOOD DRAWN DAY OF YOUR SURGERY  PATIENT SIGNATURE_________________________________

## 2013-12-16 NOTE — Pre-Procedure Instructions (Signed)
CT CHEST REPORT IN EPIC FROM 04/16/13. EKG REPORT IN EPIC FROM 08/28/13.

## 2013-12-21 ENCOUNTER — Encounter (HOSPITAL_COMMUNITY)
Admission: RE | Admit: 2013-12-21 | Discharge: 2013-12-21 | Disposition: A | Discharge status: Home / Self Care | Payer: Medicare Other | Source: Ambulatory Visit | Attending: Orthopaedic Surgery | Admitting: Orthopaedic Surgery

## 2013-12-21 DIAGNOSIS — Z112 Encounter for screening for other bacterial diseases: Secondary | ICD-10-CM | POA: Insufficient documentation

## 2013-12-21 DIAGNOSIS — Z01812 Encounter for preprocedural laboratory examination: Secondary | ICD-10-CM | POA: Insufficient documentation

## 2013-12-21 DIAGNOSIS — Z118 Encounter for screening for other infectious and parasitic diseases: Secondary | ICD-10-CM

## 2013-12-21 LAB — SURGICAL PCR SCREEN
MRSA, PCR: INVALID — AB
Staphylococcus aureus: INVALID — AB

## 2013-12-24 LAB — MRSA CULTURE

## 2013-12-25 ENCOUNTER — Encounter (HOSPITAL_COMMUNITY): Payer: Self-pay | Admitting: *Deleted

## 2013-12-25 ENCOUNTER — Inpatient Hospital Stay (HOSPITAL_COMMUNITY): Payer: Medicare Other

## 2013-12-25 ENCOUNTER — Encounter (HOSPITAL_COMMUNITY)
Admission: RE | Disposition: A | Discharge status: Home Health | Payer: Self-pay | Source: Ambulatory Visit | Attending: Orthopaedic Surgery

## 2013-12-25 ENCOUNTER — Inpatient Hospital Stay (HOSPITAL_COMMUNITY)
Admission: RE | Admit: 2013-12-25 | Discharge: 2013-12-28 | DRG: 470 | Disposition: A | Discharge status: Home Health | Payer: Medicare Other | Source: Ambulatory Visit | Attending: Orthopaedic Surgery | Admitting: Orthopaedic Surgery

## 2013-12-25 ENCOUNTER — Inpatient Hospital Stay (HOSPITAL_COMMUNITY): Payer: Medicare Other | Admitting: Registered Nurse

## 2013-12-25 ENCOUNTER — Encounter (HOSPITAL_COMMUNITY): Payer: Medicare Other | Admitting: Registered Nurse

## 2013-12-25 DIAGNOSIS — E785 Hyperlipidemia, unspecified: Secondary | ICD-10-CM | POA: Diagnosis present

## 2013-12-25 DIAGNOSIS — Z860100 Personal history of colon polyps, unspecified: Secondary | ICD-10-CM

## 2013-12-25 DIAGNOSIS — Z01812 Encounter for preprocedural laboratory examination: Secondary | ICD-10-CM

## 2013-12-25 DIAGNOSIS — M87059 Idiopathic aseptic necrosis of unspecified femur: Principal | ICD-10-CM | POA: Diagnosis present

## 2013-12-25 DIAGNOSIS — D573 Sickle-cell trait: Secondary | ICD-10-CM | POA: Diagnosis present

## 2013-12-25 DIAGNOSIS — Z87891 Personal history of nicotine dependence: Secondary | ICD-10-CM

## 2013-12-25 DIAGNOSIS — M6580 Other synovitis and tenosynovitis, unspecified site: Secondary | ICD-10-CM | POA: Diagnosis present

## 2013-12-25 DIAGNOSIS — K219 Gastro-esophageal reflux disease without esophagitis: Secondary | ICD-10-CM | POA: Diagnosis present

## 2013-12-25 DIAGNOSIS — I10 Essential (primary) hypertension: Secondary | ICD-10-CM | POA: Diagnosis present

## 2013-12-25 DIAGNOSIS — Z96649 Presence of unspecified artificial hip joint: Secondary | ICD-10-CM

## 2013-12-25 DIAGNOSIS — M87052 Idiopathic aseptic necrosis of left femur: Secondary | ICD-10-CM

## 2013-12-25 DIAGNOSIS — M109 Gout, unspecified: Secondary | ICD-10-CM | POA: Diagnosis present

## 2013-12-25 HISTORY — PX: TOTAL HIP ARTHROPLASTY: SHX124

## 2013-12-25 LAB — TYPE AND SCREEN
ABO/RH(D): O POS
Antibody Screen: NEGATIVE

## 2013-12-25 SURGERY — ARTHROPLASTY, HIP, TOTAL, ANTERIOR APPROACH
Anesthesia: Spinal | Site: Hip | Laterality: Left

## 2013-12-25 MED ORDER — FAMOTIDINE 20 MG PO TABS
20.0000 mg | ORAL_TABLET | Freq: Every day | ORAL | Status: DC
Start: 2013-12-26 — End: 2013-12-28
  Administered 2013-12-26 – 2013-12-28 (×3): 20 mg via ORAL
  Filled 2013-12-25 (×3): qty 1

## 2013-12-25 MED ORDER — POLYETHYLENE GLYCOL 3350 17 G PO PACK
17.0000 g | PACK | Freq: Every day | ORAL | Status: DC | PRN
  Administered 2013-12-26: 17 g via ORAL

## 2013-12-25 MED ORDER — CEFAZOLIN SODIUM-DEXTROSE 2-3 GM-% IV SOLR
2.0000 g | INTRAVENOUS | Status: AC
  Administered 2013-12-25: 2 g via INTRAVENOUS

## 2013-12-25 MED ORDER — OXYCODONE HCL 5 MG PO TABS
5.0000 mg | ORAL_TABLET | ORAL | Status: DC | PRN
  Administered 2013-12-25 – 2013-12-26 (×5): 10 mg via ORAL
  Filled 2013-12-25 (×5): qty 2

## 2013-12-25 MED ORDER — FENTANYL CITRATE 0.05 MG/ML IJ SOLN
INTRAMUSCULAR | Status: AC
  Filled 2013-12-25: qty 2

## 2013-12-25 MED ORDER — BUPIVACAINE HCL (PF) 0.5 % IJ SOLN
INTRAMUSCULAR | Status: DC | PRN
  Administered 2013-12-25: 3 mL

## 2013-12-25 MED ORDER — PHENYLEPHRINE HCL 10 MG/ML IJ SOLN
10.0000 mg | INTRAVENOUS | Status: DC | PRN
  Administered 2013-12-25: 5 ug/min via INTRAVENOUS
  Administered 2013-12-25: 10 ug/min via INTRAVENOUS

## 2013-12-25 MED ORDER — PHENYLEPHRINE 40 MCG/ML (10ML) SYRINGE FOR IV PUSH (FOR BLOOD PRESSURE SUPPORT)
PREFILLED_SYRINGE | INTRAVENOUS | Status: AC
  Filled 2013-12-25: qty 10

## 2013-12-25 MED ORDER — MIDAZOLAM HCL 2 MG/2ML IJ SOLN
INTRAMUSCULAR | Status: AC
  Filled 2013-12-25: qty 2

## 2013-12-25 MED ORDER — SODIUM CHLORIDE 0.9 % IR SOLN
Status: DC | PRN
  Administered 2013-12-25: 1000 mL

## 2013-12-25 MED ORDER — TRANEXAMIC ACID 100 MG/ML IV SOLN
1000.0000 mg | INTRAVENOUS | Status: AC
  Administered 2013-12-25: 1000 mg via INTRAVENOUS
  Filled 2013-12-25: qty 10

## 2013-12-25 MED ORDER — DIPHENHYDRAMINE HCL 12.5 MG/5ML PO ELIX: 12.5000 mg | ORAL_SOLUTION | ORAL | Status: DC | PRN

## 2013-12-25 MED ORDER — LACTATED RINGERS IV SOLN
INTRAVENOUS | Status: DC
  Administered 2013-12-25: 1000 mL via INTRAVENOUS
  Administered 2013-12-25 (×2): via INTRAVENOUS

## 2013-12-25 MED ORDER — EPHEDRINE SULFATE 50 MG/ML IJ SOLN
INTRAMUSCULAR | Status: DC | PRN
  Administered 2013-12-25: 10 mg via INTRAVENOUS

## 2013-12-25 MED ORDER — PHENOL 1.4 % MT LIQD: 1.0000 | OROMUCOSAL | Status: DC | PRN

## 2013-12-25 MED ORDER — FENTANYL CITRATE 0.05 MG/ML IJ SOLN: 25.0000 ug | INTRAMUSCULAR | Status: DC | PRN

## 2013-12-25 MED ORDER — METOCLOPRAMIDE HCL 5 MG/ML IJ SOLN: 5.0000 mg | Freq: Three times a day (TID) | INTRAMUSCULAR | Status: DC | PRN

## 2013-12-25 MED ORDER — MENTHOL 3 MG MT LOZG
1.0000 | LOZENGE | OROMUCOSAL | Status: DC | PRN
  Filled 2013-12-25: qty 9

## 2013-12-25 MED ORDER — PROMETHAZINE HCL 25 MG/ML IJ SOLN: 6.2500 mg | INTRAMUSCULAR | Status: DC | PRN

## 2013-12-25 MED ORDER — METHOCARBAMOL 500 MG PO TABS
500.0000 mg | ORAL_TABLET | Freq: Four times a day (QID) | ORAL | Status: DC | PRN
  Administered 2013-12-26: 500 mg via ORAL
  Filled 2013-12-25: qty 1

## 2013-12-25 MED ORDER — PROPOFOL 10 MG/ML IV BOLUS
INTRAVENOUS | Status: AC
  Filled 2013-12-25: qty 20

## 2013-12-25 MED ORDER — ONDANSETRON HCL 4 MG PO TABS: 4.0000 mg | ORAL_TABLET | Freq: Four times a day (QID) | ORAL | Status: DC | PRN

## 2013-12-25 MED ORDER — ATENOLOL 25 MG PO TABS
25.0000 mg | ORAL_TABLET | Freq: Every morning | ORAL | Status: DC
  Administered 2013-12-26 – 2013-12-28 (×3): 25 mg via ORAL
  Filled 2013-12-25 (×3): qty 1

## 2013-12-25 MED ORDER — ONDANSETRON HCL 4 MG/2ML IJ SOLN
INTRAMUSCULAR | Status: AC
  Filled 2013-12-25: qty 2

## 2013-12-25 MED ORDER — ALLOPURINOL 100 MG PO TABS
100.0000 mg | ORAL_TABLET | Freq: Every day | ORAL | Status: DC
  Administered 2013-12-26 – 2013-12-28 (×3): 100 mg via ORAL
  Filled 2013-12-25 (×3): qty 1

## 2013-12-25 MED ORDER — ACETAMINOPHEN 650 MG RE SUPP: 650.0000 mg | Freq: Four times a day (QID) | RECTAL | Status: DC | PRN

## 2013-12-25 MED ORDER — 0.9 % SODIUM CHLORIDE (POUR BTL) OPTIME
TOPICAL | Status: DC | PRN
  Administered 2013-12-25: 1000 mL

## 2013-12-25 MED ORDER — FENTANYL CITRATE 0.05 MG/ML IJ SOLN
INTRAMUSCULAR | Status: DC | PRN
  Administered 2013-12-25: 25 ug via INTRAVENOUS
  Administered 2013-12-25: 50 ug via INTRAVENOUS
  Administered 2013-12-25: 25 ug via INTRAVENOUS

## 2013-12-25 MED ORDER — MEPERIDINE HCL 50 MG/ML IJ SOLN: 6.2500 mg | INTRAMUSCULAR | Status: DC | PRN

## 2013-12-25 MED ORDER — ONDANSETRON HCL 4 MG/2ML IJ SOLN
INTRAMUSCULAR | Status: DC | PRN
  Administered 2013-12-25: 4 mg via INTRAVENOUS

## 2013-12-25 MED ORDER — GLYCOPYRROLATE 0.2 MG/ML IJ SOLN
INTRAMUSCULAR | Status: DC | PRN
  Administered 2013-12-25: 0.2 mg via INTRAVENOUS

## 2013-12-25 MED ORDER — POLYSACCHARIDE IRON COMPLEX 150 MG PO CAPS
150.0000 mg | ORAL_CAPSULE | Freq: Two times a day (BID) | ORAL | Status: DC
  Administered 2013-12-25 – 2013-12-28 (×6): 150 mg via ORAL
  Filled 2013-12-25 (×7): qty 1

## 2013-12-25 MED ORDER — HYDROMORPHONE HCL PF 1 MG/ML IJ SOLN
1.0000 mg | INTRAMUSCULAR | Status: DC | PRN
  Administered 2013-12-25: 1 mg via INTRAVENOUS
  Filled 2013-12-25: qty 1

## 2013-12-25 MED ORDER — GLYCOPYRROLATE 0.2 MG/ML IJ SOLN
INTRAMUSCULAR | Status: AC
  Filled 2013-12-25: qty 1

## 2013-12-25 MED ORDER — DOCUSATE SODIUM 100 MG PO CAPS
100.0000 mg | ORAL_CAPSULE | Freq: Two times a day (BID) | ORAL | Status: DC
  Administered 2013-12-25 – 2013-12-28 (×6): 100 mg via ORAL

## 2013-12-25 MED ORDER — MIDAZOLAM HCL 5 MG/5ML IJ SOLN
INTRAMUSCULAR | Status: DC | PRN
  Administered 2013-12-25: 2 mg via INTRAVENOUS

## 2013-12-25 MED ORDER — ALUM & MAG HYDROXIDE-SIMETH 200-200-20 MG/5ML PO SUSP
30.0000 mL | ORAL | Status: DC | PRN
  Administered 2013-12-26: 30 mL via ORAL
  Filled 2013-12-25: qty 30

## 2013-12-25 MED ORDER — PHENYLEPHRINE HCL 10 MG/ML IJ SOLN
INTRAMUSCULAR | Status: DC | PRN
  Administered 2013-12-25 (×2): 80 ug via INTRAVENOUS

## 2013-12-25 MED ORDER — CEFAZOLIN SODIUM-DEXTROSE 2-3 GM-% IV SOLR
INTRAVENOUS | Status: AC
  Filled 2013-12-25: qty 50

## 2013-12-25 MED ORDER — OXYCODONE HCL ER 10 MG PO T12A
10.0000 mg | EXTENDED_RELEASE_TABLET | Freq: Two times a day (BID) | ORAL | Status: DC
  Administered 2013-12-25 – 2013-12-27 (×3): 10 mg via ORAL
  Filled 2013-12-25 (×4): qty 1

## 2013-12-25 MED ORDER — PHENYLEPHRINE HCL 10 MG/ML IJ SOLN
INTRAMUSCULAR | Status: AC
  Filled 2013-12-25: qty 1

## 2013-12-25 MED ORDER — ACETAMINOPHEN 325 MG PO TABS
650.0000 mg | ORAL_TABLET | Freq: Four times a day (QID) | ORAL | Status: DC | PRN
  Administered 2013-12-26 – 2013-12-28 (×3): 650 mg via ORAL
  Filled 2013-12-25 (×3): qty 2

## 2013-12-25 MED ORDER — CEFAZOLIN SODIUM 1-5 GM-% IV SOLN
1.0000 g | Freq: Four times a day (QID) | INTRAVENOUS | Status: AC
  Administered 2013-12-25 – 2013-12-26 (×2): 1 g via INTRAVENOUS
  Filled 2013-12-25 (×2): qty 50

## 2013-12-25 MED ORDER — ATORVASTATIN CALCIUM 20 MG PO TABS
20.0000 mg | ORAL_TABLET | Freq: Every day | ORAL | Status: DC
  Administered 2013-12-25 – 2013-12-27 (×3): 20 mg via ORAL
  Filled 2013-12-25 (×4): qty 1

## 2013-12-25 MED ORDER — ADULT MULTIVITAMIN W/MINERALS CH
1.0000 | ORAL_TABLET | Freq: Every day | ORAL | Status: DC
  Administered 2013-12-26 – 2013-12-28 (×3): 1 via ORAL
  Filled 2013-12-25 (×3): qty 1

## 2013-12-25 MED ORDER — ASPIRIN EC 325 MG PO TBEC
325.0000 mg | DELAYED_RELEASE_TABLET | Freq: Two times a day (BID) | ORAL | Status: DC
  Administered 2013-12-26 – 2013-12-28 (×5): 325 mg via ORAL
  Filled 2013-12-25 (×7): qty 1

## 2013-12-25 MED ORDER — ONDANSETRON HCL 4 MG/2ML IJ SOLN: 4.0000 mg | Freq: Four times a day (QID) | INTRAMUSCULAR | Status: DC | PRN

## 2013-12-25 MED ORDER — LIDOCAINE HCL (CARDIAC) 20 MG/ML IV SOLN
INTRAVENOUS | Status: AC
  Filled 2013-12-25: qty 5

## 2013-12-25 MED ORDER — METOCLOPRAMIDE HCL 10 MG PO TABS: 5.0000 mg | ORAL_TABLET | Freq: Three times a day (TID) | ORAL | Status: DC | PRN

## 2013-12-25 MED ORDER — LIDOCAINE HCL (CARDIAC) 20 MG/ML IV SOLN
INTRAVENOUS | Status: DC | PRN
  Administered 2013-12-25: 100 mg via INTRAVENOUS

## 2013-12-25 MED ORDER — BUPIVACAINE HCL (PF) 0.5 % IJ SOLN
INTRAMUSCULAR | Status: AC
  Filled 2013-12-25: qty 30

## 2013-12-25 MED ORDER — METHOCARBAMOL 100 MG/ML IJ SOLN
500.0000 mg | Freq: Four times a day (QID) | INTRAMUSCULAR | Status: DC | PRN
  Administered 2013-12-25: 500 mg via INTRAVENOUS
  Filled 2013-12-25: qty 5

## 2013-12-25 MED ORDER — SODIUM CHLORIDE 0.9 % IV SOLN
INTRAVENOUS | Status: DC
  Administered 2013-12-25: 21:00:00 via INTRAVENOUS

## 2013-12-25 MED ORDER — PROPOFOL INFUSION 10 MG/ML OPTIME
INTRAVENOUS | Status: DC | PRN
  Administered 2013-12-25: 50 ug/kg/min via INTRAVENOUS

## 2013-12-25 SURGICAL SUPPLY — 40 items
BAG ZIPLOCK 12X15 (MISCELLANEOUS) IMPLANT
BENZOIN TINCTURE PRP APPL 2/3 (GAUZE/BANDAGES/DRESSINGS) ×3 IMPLANT
BLADE SAW SGTL 18X1.27X75 (BLADE) ×2 IMPLANT
BLADE SAW SGTL 18X1.27X75MM (BLADE) ×1
CAPT HIP PF COP ×3 IMPLANT
CELLS DAT CNTRL 66122 CELL SVR (MISCELLANEOUS) ×1 IMPLANT
CLOSURE WOUND 1/2 X4 (GAUZE/BANDAGES/DRESSINGS) ×1
DRAPE C-ARM 42X120 X-RAY (DRAPES) ×3 IMPLANT
DRAPE STERI IOBAN 125X83 (DRAPES) ×3 IMPLANT
DRAPE U-SHAPE 47X51 STRL (DRAPES) ×9 IMPLANT
DRSG AQUACEL AG ADV 3.5X10 (GAUZE/BANDAGES/DRESSINGS) ×3 IMPLANT
DURAPREP 26ML APPLICATOR (WOUND CARE) ×3 IMPLANT
ELECT BLADE TIP CTD 4 INCH (ELECTRODE) ×3 IMPLANT
ELECT REM PT RETURN 9FT ADLT (ELECTROSURGICAL) ×3
ELECTRODE REM PT RTRN 9FT ADLT (ELECTROSURGICAL) ×1 IMPLANT
FACESHIELD WRAPAROUND (MASK) ×12 IMPLANT
GAUZE XEROFORM 5X9 LF (GAUZE/BANDAGES/DRESSINGS) IMPLANT
GLOVE BIO SURGEON STRL SZ7.5 (GLOVE) ×3 IMPLANT
GLOVE BIOGEL PI IND STRL 8 (GLOVE) ×2 IMPLANT
GLOVE BIOGEL PI INDICATOR 8 (GLOVE) ×4
GLOVE ECLIPSE 8.0 STRL XLNG CF (GLOVE) ×3 IMPLANT
GOWN STRL REUS W/TWL XL LVL3 (GOWN DISPOSABLE) ×6 IMPLANT
HANDPIECE INTERPULSE COAX TIP (DISPOSABLE) ×2
KIT BASIN OR (CUSTOM PROCEDURE TRAY) ×3 IMPLANT
PACK TOTAL JOINT (CUSTOM PROCEDURE TRAY) ×3 IMPLANT
PADDING CAST COTTON 6X4 STRL (CAST SUPPLIES) ×3 IMPLANT
RTRCTR WOUND ALEXIS 18CM MED (MISCELLANEOUS) ×3
SET HNDPC FAN SPRY TIP SCT (DISPOSABLE) ×1 IMPLANT
STAPLER VISISTAT 35W (STAPLE) IMPLANT
STRIP CLOSURE SKIN 1/2X4 (GAUZE/BANDAGES/DRESSINGS) ×2 IMPLANT
SUT ETHIBOND NAB CT1 #1 30IN (SUTURE) ×3 IMPLANT
SUT ETHILON 3 0 PS 1 (SUTURE) IMPLANT
SUT MNCRL AB 4-0 PS2 18 (SUTURE) ×3 IMPLANT
SUT VIC AB 0 CT1 36 (SUTURE) ×3 IMPLANT
SUT VIC AB 1 CT1 36 (SUTURE) ×3 IMPLANT
SUT VIC AB 2-0 CT1 27 (SUTURE) ×4
SUT VIC AB 2-0 CT1 TAPERPNT 27 (SUTURE) ×2 IMPLANT
TOWEL OR 17X26 10 PK STRL BLUE (TOWEL DISPOSABLE) ×3 IMPLANT
TOWEL OR NON WOVEN STRL DISP B (DISPOSABLE) ×3 IMPLANT
TRAY FOLEY METER SIL LF 16FR (CATHETERS) ×3 IMPLANT

## 2013-12-25 NOTE — Brief Op Note (Signed)
12/25/2013  3:48 PM  PATIENT:  [name]  73 y.o. male  PRE-OPERATIVE DIAGNOSIS:  Avascular necrosis left hip  POST-OPERATIVE DIAGNOSIS:  Avascular necrosis left hip  PROCEDURE:  Procedure(s): LEFT TOTAL HIP ARTHROPLASTY ANTERIOR APPROACH (Left)  SURGEON:  Surgeon(s) and Role:    * Mcarthur Rossetti, MD - Primary  PHYSICIAN ASSISTANT: Benita Stabile, PA-C  ANESTHESIA:   spinal  EBL:  Total I/O In: 1000 [I.V.:1000] Out: 200 [Blood:200]  BLOOD ADMINISTERED:none  DRAINS: none   LOCAL MEDICATIONS USED:  NONE  SPECIMEN:  No Specimen  DISPOSITION OF SPECIMEN:  N/A  COUNTS:  YES  TOURNIQUET:  * No tourniquets in log *  DICTATION: .Other Dictation: Dictation Number [id number]  PLAN OF CARE: Admit to inpatient   PATIENT DISPOSITION:  PACU - hemodynamically stable.   Delay start of Pharmacological VTE agent (>24hrs) due to surgical blood loss or risk of bleeding: no

## 2013-12-25 NOTE — Anesthesia Procedure Notes (Signed)
Spinal  Patient location during procedure: OR Start time: 12/25/2013 2:11 PM End time: 12/25/2013 2:17 PM Staffing CRNA/Resident: Anne Fu Performed by: resident/CRNA  Preanesthetic Checklist Completed: patient identified, site marked, surgical consent, pre-op evaluation, timeout performed, IV checked, risks and benefits discussed and monitors and equipment checked Spinal Block Patient position: sitting Prep: Betadine Patient monitoring: heart rate, continuous pulse ox and blood pressure Approach: right paramedian Location: L2-3 Injection technique: single-shot Needle Needle type: Spinocan  Needle gauge: 22 G Needle length: 9 cm Assessment Sensory level: T4 Additional Notes Expiration date of kit checked and confirmed. Patient tolerated procedure well, without complications. X 1 attempt noted clear CSF return easy aspiration and administration.

## 2013-12-25 NOTE — Progress Notes (Signed)
Utilization review completed.  

## 2013-12-25 NOTE — Anesthesia Postprocedure Evaluation (Signed)
  Anesthesia Post-op Note  Patient: [name]  Procedure(s) Performed: Procedure(s) (LRB): LEFT TOTAL HIP ARTHROPLASTY ANTERIOR APPROACH (Left)  Patient Location: PACU  Anesthesia Type: Spinal  Level of Consciousness: awake and alert   Airway and Oxygen Therapy: Patient Spontanous Breathing  Post-op Pain: mild  Post-op Assessment: Post-op Vital signs reviewed, Patient's Cardiovascular Status Stable, Respiratory Function Stable, Patent Airway and No signs of Nausea or vomiting  Last Vitals:  Filed Vitals:   12/25/13 1140  BP: 132/73  Pulse: 72  Temp: 36.6 C  Resp: 18    Post-op Vital Signs: stable   Complications: No apparent anesthesia complications

## 2013-12-25 NOTE — H&P (Signed)
TOTAL HIP ADMISSION H&P  Patient is admitted for left total hip arthroplasty.  Subjective:  Chief Complaint: left hip pain  HPI: [name], 73 y.o. male, has a history of pain and functional disability in the left hip(s) due to avascular necrosis and patient has failed non-surgical conservative treatments for greater than 12 weeks to include NSAID's and/or analgesics, corticosteriod injections, use of assistive devices, weight reduction as appropriate and activity modification.  Onset of symptoms was gradual starting 8 years ago with gradually worsening course since that time.The patient noted no past surgery on the left hip(s).  Patient currently rates pain in the left hip at 10 out of 10 with activity. Patient has night pain, worsening of pain with activity and weight bearing, trendelenberg gait, pain that interfers with activities of daily living and pain with passive range of motion. Patient has evidence of subchondral cysts, subchondral sclerosis, periarticular osteophytes and joint space narrowing by imaging studies. This condition presents safety issues increasing the risk of falls.  There is no current active infection.  Patient Active Problem List   Diagnosis Date Noted  . Avascular necrosis of bone of left hip 12/25/2013  . Lymphadenopathy, axillary 04/07/2013  . PERSONAL HISTORY OF COLONIC POLYPS 02/17/2009  . 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 Unspecified iron deficiency anemia 10/25/2008  . Monoclonal gammopathy of undetermined significance 10/11/2008   Past Medical History  Diagnosis Date  . GERD (gastroesophageal reflux disease)   . Hypertension   . Hyperlipemia   . Gout   . Wears glasses   . Wears partial dentures     top and bottom partials  . Avascular necrosis     LEFT HIP - PT STATES HE WALKS WITH LIMP AND PAIN AT TIMES  . Lumbar pain     HX O PAIN LEFT LEG AND XRAYS BACK IN 2005 OR 2006 AND TOLD DISC RUBBING ON NERVE - GIVEN INJECTIONS LOWER BACK -  DIDN'T SOLVE THE PAIN DOWN LEFT LEG  . IgG lambda monoclonal gammopathy     "OF UNDETERMINED SIGNIFICANCE" - FOLLOWED BY DR. Beryle Beams  . Anemia     sees dr Griffin Basil OF IRON DEFICIENCY ANEMIA FROM INTERMITTENT GI BLOOD LOSS RELATED TO SMALL BOWEL AVMS  . AVM (arteriovenous malformation) of colon     HX OF GI BLOOD LOSS RELATED TO SMALL BOWEL AVM'S  . Sickle cell trait   . Lymphadenopathy, axillary 04/07/2013    3 cm right axillary node S/P BIOPSY - NOT MALIGNANT  . Abnormal CT of the chest 04/16/13     Past Surgical History  Procedure Laterality Date  . Appendectomy    . Enteroscopy  08/24/2011    Procedure: ENTEROSCOPY;  Surgeon: Inda Castle, MD;  Location: [facility];  Service: Endoscopy;  Laterality: N/A;  . Colonoscopy    . Hernia repair      rt ing  . Mass excision Right 09/01/2013    Procedure: EXCISION AXILLA  MASS;  Surgeon: Joyice Faster. Cornett, MD;  Location: [facility];  Service: General;  Laterality: Right;    No prescriptions prior to admission   No Known Allergies  History  Substance Use Topics  . Smoking status: Former Smoker    Quit date: 08/27/1992  . Smokeless tobacco: Never Used  . Alcohol Use: Yes     Comment: Gin and beer every other day    Family History  Problem Relation Age of Onset  . Colon cancer Neg Hx      Review  of Systems  All other systems reviewed and are negative.   Objective:  Physical Exam  Constitutional: He is oriented to person, place, and time. He appears well-developed and well-nourished.  HENT:  Head: Normocephalic and atraumatic.  Eyes: EOM are normal. Pupils are equal, round, and reactive to light.  Neck: Normal range of motion. Neck supple.  Cardiovascular: Normal rate and regular rhythm.   Respiratory: Effort normal and breath sounds normal.  GI: Soft. Bowel sounds are normal.  Musculoskeletal:       Left hip: He exhibits decreased range of motion, decreased strength, tenderness, bony  tenderness and crepitus.  Neurological: He is alert and oriented to person, place, and time.  Skin: Skin is warm and dry.  Psychiatric: He has a normal mood and affect.    Vital signs in last 24 hours:    Labs:   Estimated body mass index is 28.18 kg/(m^2) as calculated from the following:   Height as of 10/28/13: 6\' 2"  (1.88 m).   Weight as of 10/28/13: 99.61 kg (219 lb 9.6 oz).   Imaging Review Plain radiographs demonstrate severe degenerative joint disease of the left hip(s). The bone quality appears to be good for age and reported activity level.  Assessment/Plan:  End stage arthritis, left hip(s)  The patient history, physical examination, clinical judgement of the provider and imaging studies are consistent with end stage degenerative joint disease of the left hip(s) and total hip arthroplasty is deemed medically necessary. The treatment options including medical management, injection therapy, arthroscopy and arthroplasty were discussed at length. The risks and benefits of total hip arthroplasty were presented and reviewed. The risks due to aseptic loosening, infection, stiffness, dislocation/subluxation,  thromboembolic complications and other imponderables were discussed.  The patient acknowledged the explanation, agreed to proceed with the plan and consent was signed. Patient is being admitted for inpatient treatment for surgery, pain control, PT, OT, prophylactic antibiotics, VTE prophylaxis, progressive ambulation and ADL's and discharge planning.The patient is planning to be discharged home with home health services

## 2013-12-25 NOTE — Anesthesia Preprocedure Evaluation (Addendum)
Anesthesia Evaluation  Patient identified by MRN, date of birth, ID band Patient awake    Reviewed: Allergy & Precautions, H&P , NPO status , Patient's Chart, lab work & pertinent test results, reviewed documented beta blocker date and time   Airway Mallampati: I TM Distance: >3 FB Neck ROM: Full    Dental  (+) Partial Lower, Partial Upper, Dental Advisory Given   Pulmonary former smoker,  breath sounds clear to auscultation        Cardiovascular hypertension, Pt. on medications and Pt. on home beta blockers Rhythm:Regular Rate:Normal     Neuro/Psych negative neurological ROS  negative psych ROS   GI/Hepatic Neg liver ROS, GERD-  Medicated and Controlled,  Endo/Other  negative endocrine ROS  Renal/GU negative Renal ROS  negative genitourinary   Musculoskeletal negative musculoskeletal ROS (+)   Abdominal   Peds negative pediatric ROS (+)  Hematology negative hematology ROS (+)   Anesthesia Other Findings   Reproductive/Obstetrics negative OB ROS                         Anesthesia Physical  Anesthesia Plan  ASA: II  Anesthesia Plan: Spinal   Post-op Pain Management:    Induction:   Airway Management Planned: Simple Face Mask  Additional Equipment:   Intra-op Plan:   Post-operative Plan:   Informed Consent: I have reviewed the patients History and Physical, chart, labs and discussed the procedure including the risks, benefits and alternatives for the proposed anesthesia with the patient or authorized representative who has indicated his/her understanding and acceptance.   Dental advisory given  Plan Discussed with: CRNA, Anesthesiologist and Surgeon  Anesthesia Plan Comments:        Anesthesia Quick Evaluation

## 2013-12-25 NOTE — Plan of Care (Signed)
Problem: Consults Goal: Diagnosis- Total Joint Replacement Outcome: Completed/Met Date Met:  12/25/13 Primary Total Hip LEFT, Anterior

## 2013-12-25 NOTE — Op Note (Signed)
NAMECHRISTOPHER, [name] NO.:  [id number]  MEDICAL RECORD NO.:  [id number]  LOCATION:  57                         FACILITY:  [facility]  PHYSICIAN:  Lind Guest. Ninfa Linden, M.D.DATE OF BIRTH:  08-29-1941  DATE OF PROCEDURE:  12/25/2013 DATE OF DISCHARGE:                              OPERATIVE REPORT   PREOPERATIVE DIAGNOSIS:  Severe avascular necrosis, left hip.  POSTOPERATIVE DIAGNOSIS:  Severe avascular necrosis, left hip.  PROCEDURE:  Left total hip arthroplasty through direct anterior approach.  IMPLANTS:  DePuy Sector Gription acetabular component size 54, with apex hole eliminator guide and a single screw.  Size 36+ 4 neutral polyethylene liner.  Size 12 Corail femoral component with standard offset, size 36+ 8 ceramic hip ball.  SURGEON:  Lind Guest. Ninfa Linden, M.D.  ASSISTANT:  Erskine Emery, PA-C  ANESTHESIA:  Spinal.  BLOOD LOSS:  Less than 500 mL.  ANTIBIOTICS:  2 g IV Ancef.  COMPLICATIONS:  None.  INDICATIONS:  Mr. [name] is a 73 year old gentleman with known avascular necrosis involving his left hip.  He has had numerous steroid injections in that hip.  His pain is daily, his mobility is limited and his quality of life has gone down hill.  He has x-rays that shows collapse of the femoral head and cystic changes in the femoral head, as well as the acetabulum.  At this point, he wished to proceed with a total hip arthroplasty given his increased pain and decreased mobility, and his overall poor quality of life.  DESCRIPTION OF PROCEDURE:  After informed consent was obtained, appropriate left hip was marked.  He was brought to the operating room and spinal anesthesia was obtained while he was on a stretcher.  Foley catheter was placed and then both feet had traction boots applied to them.  Next, he was placed supine on the Hana fracture table with perineal post in place and both legs in inline skeletal traction devices, but no traction  applied.  His left operative hip was prepped and draped with DuraPrep and sterile drapes.  A time-out was called to identify correct patient, correct left hip.  I then made incision inferior and posterior to the anterior superior iliac spine and carried this obliquely down the leg.  I dissected down to the tensor fascia lata muscle and the tensor fascia was split and divided longitudinally.  I then proceeded with a direct anterior approach to the hip.  I put the Cobra retractors around the lateral neck and up underneath the rectus femoris on the medial neck.  I cauterized the lateral femoral circumflex vessels and then opened up the hip capsule in a L-tye format finding a large joint effusion and significant synovitis consistent with avascular necrosis.  I put Cobra retractors within the joint capsule and then made my femoral neck just proximal to the lesser trochanter with an oscillating saw and completed this with an osteotome.  I then placed a corkscrew guide in the femoral head and removed the femoral head in its entirety and found it to have significant soft cartilage and areas of collapse of the cartilage.  I then cleaned the acetabulum and debris, and began reaming in 2 mm increments  from a size 42 all the way up to a size 54 with all reamers under direct visualization and the last 2 reamers under direct fluoroscopy, so we could obtain our depth of reaming, our inclination, and anteversion.  I then placed the real DePuy Sector Gription acetabular component size 54, the apex hole eliminator guide and a single screw.  I then placed the real 36+ 4 neutral polyethylene liner, neutral polyethylene liner for a size 54 cup. Attention was then turned to the femur with the leg externally rotated to 100 degrees, extended and adducted.  We were able to gain access to the femoral canal using a box cutting osteotome and a rongeur to lateralize, then began broaching from a size 8 broach using a  Corail broaching system from DePuy up to a size 12, and 12 felt to be stable, so we trialed a 36+ 5 hip ball due to our lower cut.  With the leg back up and over, we pulled traction internal rotation reducing the pelvis and it was stable except for past 95 degrees of external rotation, it started to come out.  I measured his leg lengths which showed he was such short, so we decided to go with 36+ 8 hip ball.  We dislocated the hip and removed the trial components and then placed the real size 12 Corail femoral component with standard offset and the real 36+ 8 ceramic hip ball reduces his back in the acetabulum, it was stable.  We copiously irrigated the soft tissue with normal saline solution, closed the joint capsule with interrupted #1 Ethibond suture followed by a running #1 Vicryl in the tensor fascia, 0 Vicryl in the deep tissue, 2-0 Vicryl in subcutaneous tissue, 4-0 Monocryl subcuticular stitch and Steri-Strips on the skin.  An Aquacel dressing was applied.  He was taken off the Hana table to the recovery room in stable condition.  All final counts were correct and no complications noted.  Of note, Erskine Emery, PA-C assisted during the entire case and his assistance was crucial for facility on the case, and the patient's positioning, retracting and layered closure of the wound.     Lind Guest. Ninfa Linden, M.D.     CYB/MEDQ  D:  12/25/2013  T:  12/25/2013  Job:  [id number]

## 2013-12-25 NOTE — Transfer of Care (Signed)
Immediate Anesthesia Transfer of Care Note  Patient: [name]  Procedure(s) Performed: Procedure(s): LEFT TOTAL HIP ARTHROPLASTY ANTERIOR APPROACH (Left)  Patient Location: PACU  Anesthesia Type:Spinal  Level of Consciousness: awake, alert , oriented and patient cooperative  Airway & Oxygen Therapy: Patient Spontanous Breathing and Patient connected to face mask oxygen  Post-op Assessment: Report given to PACU RN and Post -op Vital signs reviewed and stable  Post vital signs: stable  Complications: No apparent anesthesia complications  T 10 spinal level

## 2013-12-26 ENCOUNTER — Encounter (HOSPITAL_COMMUNITY): Payer: Self-pay | Admitting: *Deleted

## 2013-12-26 LAB — BASIC METABOLIC PANEL
BUN: 8 mg/dL (ref 6–23)
CALCIUM: 8 mg/dL — AB (ref 8.4–10.5)
CO2: 25 meq/L (ref 19–32)
Chloride: 107 mEq/L (ref 96–112)
Creatinine, Ser: 1.26 mg/dL (ref 0.50–1.35)
GFR calc Af Amer: 65 mL/min — ABNORMAL LOW (ref >90)
GFR calc non Af Amer: 56 mL/min — ABNORMAL LOW (ref >90)
GLUCOSE: 102 mg/dL — AB (ref 70–99)
Potassium: 4.2 mEq/L (ref 3.7–5.3)
Sodium: 140 mEq/L (ref 137–147)

## 2013-12-26 LAB — CBC
HCT: 29.2 % — ABNORMAL LOW (ref 39.0–52.0)
Hemoglobin: 9.3 g/dL — ABNORMAL LOW (ref 13.0–17.0)
MCH: 24.5 pg — AB (ref 26.0–34.0)
MCHC: 31.8 g/dL (ref 30.0–36.0)
MCV: 77 fL — ABNORMAL LOW (ref 78.0–100.0)
PLATELETS: 150 10*3/uL (ref 150–400)
RBC: 3.79 MIL/uL — AB (ref 4.22–5.81)
RDW: 16.9 % — ABNORMAL HIGH (ref 11.5–15.5)
WBC: 4.7 10*3/uL (ref 4.0–10.5)

## 2013-12-26 NOTE — Evaluation (Signed)
Occupational Therapy Evaluation Patient Details Name: [name] MRN: [id number] DOB: 11/28/1940 Today's Date: 12/26/2013    History of Present Illness Left total hip arthroplasty through direct anterior approach   Clinical Impression   This 73 yo male independent pta admitted and underwent above presents to acute OT with increased pain and decreased mobility all affecting pt's ability to take care of himself. Will benefit from one more session of OT to address shower stall transfers.    Follow Up Recommendations  No OT follow up    Equipment Recommendations  None recommended by OT       Precautions / Restrictions Precautions Precautions: Fall Restrictions Weight Bearing Restrictions: No      Mobility Bed Mobility Overal bed mobility: Needs Assistance Bed Mobility: Supine to Sit     Supine to sit: Min assist;HOB elevated     General bed mobility comments: VCs for sequencing  Transfers Overall transfer level: Needs assistance Equipment used: Rolling walker (2 wheeled) Transfers: Sit to/from Stand Sit to Stand: Min assist         General transfer comment: VCs for safe hand placement         ADL Overall ADL's : Needs assistance/impaired Eating/Feeding: Independent;Sitting   Grooming: Set up;Sitting   Upper Body Bathing: Set up;Sitting   Lower Body Bathing: Maximal assistance;Sit to/from stand   Upper Body Dressing : Set up;Sitting   Lower Body Dressing: Total assistance;Sit to/from stand   Toilet Transfer: Minimal assistance;Ambulation (Bed (raised)>8 feet>sit in recliner behind him)   Toileting- Clothing Manipulation and Hygiene: Moderate assistance;Sit to/from stand       Functional mobility during ADLs: Minimal assistance General ADL Comments: Pt's wife will A him with LBADLs until he can do them for himself. They are aware that LLE goes in first and out last.               Pertinent Vitals/Pain 7/10 left hip with movement; RN made aware  and came in to give pt meds, ice also applied     Hand Dominance Right   Extremity/Trunk Assessment Upper Extremity Assessment Upper Extremity Assessment: Overall WFL for tasks assessed   Lower Extremity Assessment Lower Extremity Assessment: Defer to PT evaluation       Communication Communication Communication: No difficulties   Cognition Arousal/Alertness: Awake/alert Behavior During Therapy: WFL for tasks assessed/performed Overall Cognitive Status: Within Functional Limits for tasks assessed                                Home Living Family/patient expects to be discharged to:: Private residence Living Arrangements: Spouse/significant other Available Help at Discharge: Family;Available 24 hours/day Type of Home: House Home Access: Stairs to enter [employer] of Steps: 3 Entrance Stairs-Rails: None Home Layout: One level     Bathroom Shower/Tub: Walk-in shower;Door   Bathroom Toilet: Handicapped height     Home Equipment: Bedside commode          Prior Functioning/Environment Level of Independence: Independent             OT Diagnosis: Generalized weakness;Acute pain   OT Problem List: Decreased strength;Decreased range of motion;Impaired balance (sitting and/or standing);Pain;Decreased knowledge of use of DME or AE   OT Treatment/Interventions: Self-care/ADL training;Patient/family education;Balance training;Therapeutic activities;DME and/or AE instruction    OT Goals(Current goals can be found in the care plan section) Acute Rehab OT Goals Patient Stated Goal: home on Monday OT Goal Formulation: With  patient Time For Goal Achievement: 01/02/14 Potential to Achieve Goals: Good  OT Frequency: Min 2X/week           Co-evaluation PT/OT/SLP Co-Evaluation/Treatment: Yes Reason for Co-Treatment: For patient/therapist safety   OT goals addressed during session: ADL's and self-care;Strengthening/ROM      End of Session  Equipment Utilized During Treatment: Gait belt;Rolling walker Nurse Communication: Mobility status (NT)  Activity Tolerance: Patient tolerated treatment well Patient left: in chair;with family/visitor present (built up)   Time: 0800-0827 OT Time Calculation (min): 27 min Charges:  OT General Charges $OT Visit: 1 Procedure OT Evaluation $Initial OT Evaluation Tier I: 1 Procedure OT Treatments $Self Care/Home Management : 8-22 mins  Almon Register [phone] 12/26/2013, 9:26 AM

## 2013-12-26 NOTE — Evaluation (Signed)
Physical Therapy Evaluation Patient Details Name: [name] MRN: [id number] DOB: October 07, 1940 Today's Date: 12/26/2013   History of Present Illness  Left total hip arthroplasty through direct anterior approach  Clinical Impression  Pt presents with deficits in gait, mobility, activity tolerance and strength and will benefit from skilled PT services to address deficits and increase functional independence.    Follow Up Recommendations Home health PT;Supervision - Intermittent    Equipment Recommendations  Rolling walker with 5" wheels    Recommendations for Other Services       Precautions / Restrictions Precautions Precautions: Fall Restrictions Weight Bearing Restrictions: No      Mobility  Bed Mobility Overal bed mobility: Needs Assistance Bed Mobility: Supine to Sit     Supine to sit: Min assist;HOB elevated     General bed mobility comments: VCs for sequencing  Transfers Overall transfer level: Needs assistance Equipment used: Rolling walker (2 wheeled) Transfers: Sit to/from Stand Sit to Stand: Min assist         General transfer comment: VCs for safe hand placement  Ambulation/Gait Ambulation/Gait assistance: Min guard Ambulation Distance (Feet): 20 Feet Assistive device: Rolling walker (2 wheeled)     Gait velocity interpretation: Below normal speed for age/gender General Gait Details: cues for step through pattern, heavy reliance on UEs, cues for sequencing  Stairs            Wheelchair Mobility    Modified Rankin (Stroke Patients Only)       Balance                                             Pertinent Vitals/Pain Pt c/o 7/10 pain in L hip, RN made aware and meds rec'd    Home Living Family/patient expects to be discharged to:: Private residence Living Arrangements: Spouse/significant other Available Help at Discharge: Family;Available 24 hours/day Type of Home: House Home Access: Stairs to enter Entrance  Stairs-Rails: None Entrance Stairs-Number of Steps: 3 Home Layout: One level Home Equipment: Bedside commode      Prior Function Level of Independence: Independent               Hand Dominance   Dominant Hand: Right    Extremity/Trunk Assessment   Upper Extremity Assessment: Overall WFL for tasks assessed           Lower Extremity Assessment: LLE deficits/detail   LLE Deficits / Details: grossly 3-/5  Cervical / Trunk Assessment: Normal  Communication   Communication: No difficulties  Cognition Arousal/Alertness: Awake/alert Behavior During Therapy: WFL for tasks assessed/performed Overall Cognitive Status: Within Functional Limits for tasks assessed                      General Comments      Exercises        Assessment/Plan    PT Assessment Patient needs continued PT services  PT Diagnosis Difficulty walking;Generalized weakness;Acute pain   PT Problem List Decreased strength;Decreased activity tolerance;Decreased mobility;Pain;Decreased knowledge of use of DME  PT Treatment Interventions Gait training;Stair training;Functional mobility training;Therapeutic exercise;Therapeutic activities;Wheelchair mobility training;Patient/family education;Neuromuscular re-education;Balance training;DME instruction;Modalities   PT Goals (Current goals can be found in the Care Plan section) Acute Rehab PT Goals Patient Stated Goal: home on Monday PT Goal Formulation: With patient/family Time For Goal Achievement: 01/02/14 Potential to Achieve Goals: Good    Frequency 7X/week  Barriers to discharge        Co-evaluation PT/OT/SLP Co-Evaluation/Treatment: Yes Reason for Co-Treatment: For patient/therapist safety PT goals addressed during session: Mobility/safety with mobility;Balance;Proper use of DME;Strengthening/ROM OT goals addressed during session: ADL's and self-care;Strengthening/ROM       End of Session Equipment Utilized During Treatment:  Gait belt Activity Tolerance: Patient tolerated treatment well Patient left: in chair;with family/visitor present;with call bell/phone within reach Nurse Communication: Mobility status         Time: 0800-0827 PT Time Calculation (min): 27 min   Charges:   PT Evaluation $Initial PT Evaluation Tier I: 1 Procedure PT Treatments $Therapeutic Activity: 8-22 mins   PT G Codes:          [name] 12/26/2013, 10:58 AM

## 2013-12-26 NOTE — Progress Notes (Signed)
Physical Therapy Treatment Patient Details Name: [name] MRN: [id number] DOB: 1940-11-13 Today's Date: 12/26/2013    History of Present Illness Left total hip arthroplasty through direct anterior approach    PT Comments    POD # 1 pm session.  Assisted pt out of recliner to amb in hallway then back to bed.   Follow Up Recommendations  Home health PT;Supervision - Intermittent     Equipment Recommendations  Rolling walker with 5" wheels    Recommendations for Other Services       Precautions / Restrictions Precautions Precautions: Fall Restrictions Weight Bearing Restrictions: No    Mobility  Bed Mobility Overal bed mobility: Needs Assistance Bed Mobility: Sit to Supine       Sit to supine: Mod assist   General bed mobility comments: VCs for sequencing  Transfers Overall transfer level: Needs assistance Equipment used: Rolling walker (2 wheeled) Transfers: Sit to/from Stand Sit to Stand: Min assist         General transfer comment: VCs for safe hand placement  Ambulation/Gait Ambulation/Gait assistance: Min guard;Min assist Ambulation Distance (Feet): 45 Feet Assistive device: Rolling walker (2 wheeled) Gait Pattern/deviations: Step-to pattern Gait velocity: decreased   General Gait Details: cues for step through pattern, heavy reliance on UEs, cues for sequencing   Stairs            Wheelchair Mobility    Modified Rankin (Stroke Patients Only)       Balance                                    Cognition                            Exercises      General Comments        Pertinent Vitals/Pain     Home Living                      Prior Function            PT Goals (current goals can now be found in the care plan section) Progress towards PT goals: Progressing toward goals    Frequency  7X/week    PT Plan      Co-evaluation             End of Session Equipment Utilized  During Treatment: Gait belt Activity Tolerance: Patient tolerated treatment well Patient left: in bed;with call bell/phone within reach     Time: 1415-1440 PT Time Calculation (min): 25 min  Charges:  $Gait Training: 8-22 mins $Therapeutic Activity: 8-22 mins                    G Codes:     Rica Koyanagi  PTA [facility]  Acute  Rehab Pager      [phone]

## 2013-12-26 NOTE — Progress Notes (Signed)
Subjective: Pt stable - looks good - pain minimal   Objective: Vital signs in last 24 hours: Temp:  [97.3 F (36.3 C)-99.1 F (37.3 C)] 97.7 F (36.5 C) (04/11 0606) Pulse Rate:  [54-97] 81 (04/11 0606) Resp:  [10-18] 16 (04/11 0606) BP: (88-135)/(40-90) 92/53 mmHg (04/11 0606) SpO2:  [95 %-100 %] 97 % (04/11 0606) Weight:  [97.977 kg (216 lb)] 97.977 kg (216 lb) (04/10 1945)  Intake/Output from previous day: 04/10 0701 - 04/11 0700 In: 3565 [P.O.:240; I.V.:3225; IV Piggyback:100] Out: 1300 [Urine:1100; Blood:200] Intake/Output this shift:    Exam:  Sensation intact distally Intact pulses distally Dorsiflexion/Plantar flexion intact  Labs:  Recent Labs  12/26/13 0525  HGB 9.3*    Recent Labs  12/26/13 0525  WBC 4.7  RBC 3.79*  HCT 29.2*  PLT 150    Recent Labs  12/26/13 0525  NA 140  K 4.2  CL 107  CO2 25  BUN 8  CREATININE 1.26  GLUCOSE 102*  CALCIUM 8.0*   No results found for this basename: LABPT, INR,  in the last 72 hours  Assessment/Plan: Pt doing well - has been up - should progress nicely   Meredith Pel 12/26/2013, 9:05 AM

## 2013-12-26 NOTE — Progress Notes (Signed)
   CARE MANAGEMENT NOTE 12/26/2013  Patient:  DUTCH, ING   Account Number:  [id number]  Date Initiated:  12/26/2013  Documentation initiated by:  [facility]  Subjective/Objective Assessment:   Left total hip arthroplasty     Action/Plan:   [facility]   Anticipated DC Date:  12/28/2013   Anticipated DC Plan:  [place]  CM consult      [facility] Choice  HOME HEALTH   Choice offered to / List presented to:  C-1 Patient   DME arranged  3-N-1  Vassie Moselle      DME agency  [place] arranged  HH-2 PT      [place]   Status of service:  Completed, signed off Medicare Important Message given?   (If response is "NO", the following Medicare IM given date fields will be blank) Date Medicare IM given:   Date Additional Medicare IM given:    Discharge Disposition:  [place]  Per UR Regulation:    If discussed at Long Length of Stay Meetings, dates discussed:    Comments:  12/26/2013 1000 NCM spoke to pt and offered HH. Pt requested [facility] for [facility]. States wife at home to assist with his care. Requested 3n1 and RW for [facility]. [place] notified for DME. Jonnie Finner RN CCM Case Mgmt phone [phone]

## 2013-12-27 LAB — CBC
HCT: 28.6 % — ABNORMAL LOW (ref 39.0–52.0)
Hemoglobin: 9.1 g/dL — ABNORMAL LOW (ref 13.0–17.0)
MCH: 24.4 pg — ABNORMAL LOW (ref 26.0–34.0)
MCHC: 31.8 g/dL (ref 30.0–36.0)
MCV: 76.7 fL — AB (ref 78.0–100.0)
PLATELETS: 142 10*3/uL — AB (ref 150–400)
RBC: 3.73 MIL/uL — AB (ref 4.22–5.81)
RDW: 16.9 % — ABNORMAL HIGH (ref 11.5–15.5)
WBC: 9.2 10*3/uL (ref 4.0–10.5)

## 2013-12-27 MED ORDER — BISACODYL 10 MG RE SUPP
10.0000 mg | Freq: Once | RECTAL | Status: AC
  Administered 2013-12-27: 10 mg via RECTAL
  Filled 2013-12-27: qty 1

## 2013-12-27 NOTE — Plan of Care (Signed)
Problem: Phase III Progression Outcomes Goal: Anticoagulant follow-up in place Outcome: Not Applicable Date Met:  12/27/13 asa     

## 2013-12-27 NOTE — Progress Notes (Signed)
Occupational Therapy Treatment Patient Details Name: [name] MRN: [id number] DOB: 07-02-1941 Today's Date: 12/27/2013    History of present illness Left total hip arthroplasty through direct anterior approach      Follow Up Recommendations  No OT follow up    Equipment Recommendations  None recommended by OT       Precautions / Restrictions Precautions Precautions: Fall Restrictions Weight Bearing Restrictions: No       Mobility Bed Mobility         Supine to sit: Min assist;HOB elevated     General bed mobility comments: assist for L LE  Transfers Overall transfer level: Needs assistance Equipment used: Rolling walker (2 wheeled) Transfers: Sit to/from Stand Sit to Stand: Supervision         General transfer comment: cues for hand placement, increased time        ADL                           Toilet Transfer: Minimal assistance;Ambulation   Toileting- Clothing Manipulation and Hygiene: Sit to/from stand;Minimal assistance   Tub/ Shower Transfer: Minimal assistance   Functional mobility during ADLs: Minimal assistance General ADL Comments: Pt's wife will A him with LBADLs until he can do them for himself. They are aware that LLE goes in first and out last.                Cognition   Behavior During Therapy: [facility] for tasks assessed/performed Overall Cognitive Status: Within Functional Limits for tasks assessed                               General Comments        Frequency Min 2X/week     Progress Toward Goals  OT Goals(current goals can now be found in the care plan section)  Progress towards OT goals: Progressing toward goals  Acute Rehab OT Goals OT Goal Formulation: With patient         End of Session Equipment Utilized During Treatment: Gait belt;Rolling walker   Activity Tolerance Patient tolerated treatment well   Patient Left in chair;with family/visitor present (built up)   Nurse  Communication Mobility status (NT)        Time: 0932-3557 OT Time Calculation (min): 10 min  Charges: OT General Charges $OT Visit: 1 Procedure OT Treatments $Self Care/Home Management : 8-22 mins  Betsy Pries 12/27/2013, 11:31 AM

## 2013-12-27 NOTE — Progress Notes (Signed)
Physical Therapy Treatment Patient Details Name: [name] MRN: [id number] DOB: 1941-03-31 Today's Date: December 29, 2013    History of Present Illness Left total hip arthroplasty through direct anterior approach    PT Comments    Pt educated on and performed stair negotiation with RW for home entry, pt states he feels safe and comfortable using this method and that he could explain it to his wife.  Pt able to increase gait distance and independence with transfers and gait today.  Follow Up Recommendations  Home health PT;Supervision - Intermittent     Equipment Recommendations  Rolling walker with 5" wheels    Recommendations for Other Services       Precautions / Restrictions Precautions Precautions: Fall Restrictions Weight Bearing Restrictions: No    Mobility  Bed Mobility         Supine to sit: Min assist;HOB elevated     General bed mobility comments: assist for L LE  Transfers       Sit to Stand: Supervision         General transfer comment: cues for hand placement, increased time  Ambulation/Gait Ambulation/Gait assistance: Supervision Ambulation Distance (Feet): 50 Feet (x 2) Assistive device: Rolling walker (2 wheeled)       General Gait Details: cues for step through pattern, heavy reliance on UEs, cues for sequencing   Stairs Stairs: Yes Stairs assistance: Min assist Stair Management: No rails;With walker;Backwards Number of Stairs: 3 General stair comments: pt educated with demo and then performed stair negotiation with RW since pt has no railings at home. pt able to perform with increased time, no LOB, assist to steady RW on stairs  Wheelchair Mobility    Modified Rankin (Stroke Patients Only)       Balance                                    Cognition Arousal/Alertness: Awake/alert Behavior During Therapy: WFL for tasks assessed/performed Overall Cognitive Status: Within Functional Limits for tasks assessed                       Exercises      General Comments        Pertinent Vitals/Pain Pt c/o initial pain in L hip, eases with gait training    Home Living                      Prior Function            PT Goals (current goals can now be found in the care plan section) Progress towards PT goals: Progressing toward goals    Frequency  7X/week    PT Plan Current plan remains appropriate    Co-evaluation             End of Session Equipment Utilized During Treatment: Gait belt Activity Tolerance: Patient tolerated treatment well Patient left: in chair;with call bell/phone within reach     Time: 0800-0839 PT Time Calculation (min): 39 min  Charges:  $Gait Training: 38-52 mins                    G Codes:      [name] 12/29/2013, 9:12 AM

## 2013-12-27 NOTE — Progress Notes (Signed)
Subjective: 2 Days Post-Op Procedure(s) (LRB): LEFT TOTAL HIP ARTHROPLASTY ANTERIOR APPROACH (Left) Patient reports pain as 4 on 0-10 scale.    Objective: Vital signs in last 24 hours: Temp:  [98 F (36.7 C)-101.5 F (38.6 C)] 99.7 F (37.6 C) (04/12 1416) Pulse Rate:  [99-121] 106 (04/12 1416) Resp:  [16-18] 18 (04/12 1416) BP: (113-139)/(61-84) 113/65 mmHg (04/12 1416) SpO2:  [91 %-97 %] 97 % (04/12 1416)  Intake/Output from previous day: 04/11 0701 - 04/12 0700 In: 1233.8 [P.O.:360; I.V.:873.8] Out: 450 [Urine:450] Intake/Output this shift: Total I/O In: 240 [P.O.:240] Out: 1250 [Urine:1250]   Recent Labs  12/26/13 0525 12/27/13 0600  HGB 9.3* 9.1*    Recent Labs  12/26/13 0525 12/27/13 0600  WBC 4.7 9.2  RBC 3.79* 3.73*  HCT 29.2* 28.6*  PLT 150 142*    Recent Labs  12/26/13 0525  NA 140  K 4.2  CL 107  CO2 25  BUN 8  CREATININE 1.26  GLUCOSE 102*  CALCIUM 8.0*   No results found for this basename: LABPT, INR,  in the last 72 hours  Neurologically intact  Assessment/Plan: 2 Days Post-Op Procedure(s) (LRB): LEFT TOTAL HIP ARTHROPLASTY ANTERIOR APPROACH (Left) Up with therapy,   Home Monday .   [name] 12/27/2013, 4:25 PM

## 2013-12-28 ENCOUNTER — Encounter (HOSPITAL_COMMUNITY): Payer: Self-pay | Admitting: Orthopaedic Surgery

## 2013-12-28 LAB — CBC
HCT: 27.3 % — ABNORMAL LOW (ref 39.0–52.0)
HEMOGLOBIN: 8.8 g/dL — AB (ref 13.0–17.0)
MCH: 24.5 pg — ABNORMAL LOW (ref 26.0–34.0)
MCHC: 32.2 g/dL (ref 30.0–36.0)
MCV: 76 fL — ABNORMAL LOW (ref 78.0–100.0)
PLATELETS: 139 10*3/uL — AB (ref 150–400)
RBC: 3.59 MIL/uL — ABNORMAL LOW (ref 4.22–5.81)
RDW: 16.7 % — ABNORMAL HIGH (ref 11.5–15.5)
WBC: 9.7 10*3/uL (ref 4.0–10.5)

## 2013-12-28 MED ORDER — ASPIRIN 325 MG PO TBEC: 325.0000 mg | DELAYED_RELEASE_TABLET | Freq: Two times a day (BID) | ORAL | Status: DC

## 2013-12-28 MED ORDER — OXYCODONE-ACETAMINOPHEN 5-325 MG PO TABS: 1.0000 | ORAL_TABLET | ORAL | Status: DC | PRN

## 2013-12-28 NOTE — Discharge Summary (Signed)
Patient ID: [name] MRN: [id number] DOB/AGE: December 02, 1940 73 y.o.  Admit date: 12/25/2013 Discharge date: 12/28/2013  Admission Diagnoses:  Principal Problem:   Avascular necrosis of bone of left hip Active Problems:   Status post THR (total hip replacement)   Discharge Diagnoses:  Same  Past Medical History  Diagnosis Date  . GERD (gastroesophageal reflux disease)   . Hypertension   . Hyperlipemia   . Gout   . Wears glasses   . Wears partial dentures     top and bottom partials  . Avascular necrosis     LEFT HIP - PT STATES HE WALKS WITH LIMP AND PAIN AT TIMES  . Lumbar pain     HX O PAIN LEFT LEG AND XRAYS BACK IN 2005 OR 2006 AND TOLD DISC RUBBING ON NERVE - GIVEN INJECTIONS LOWER BACK - DIDN'T SOLVE THE PAIN DOWN LEFT LEG  . IgG lambda monoclonal gammopathy     "OF UNDETERMINED SIGNIFICANCE" - FOLLOWED BY DR. Beryle Beams  . Anemia     sees dr Griffin Basil OF IRON DEFICIENCY ANEMIA FROM INTERMITTENT GI BLOOD LOSS RELATED TO SMALL BOWEL AVMS  . AVM (arteriovenous malformation) of colon     HX OF GI BLOOD LOSS RELATED TO SMALL BOWEL AVM'S  . Sickle cell trait   . Lymphadenopathy, axillary 04/07/2013    3 cm right axillary node S/P BIOPSY - NOT MALIGNANT  . Abnormal CT of the chest 04/16/13     Surgeries: Procedure(s): LEFT TOTAL HIP ARTHROPLASTY ANTERIOR APPROACH on 12/25/2013   Consultants:    Discharged Condition: Improved  Hospital Course: [name] is an 73 y.o. male who was admitted 12/25/2013 for operative treatment ofAvascular necrosis of bone of left hip. Patient has severe unremitting pain that affects sleep, daily activities, and work/hobbies. After pre-op clearance the patient was taken to the operating room on 12/25/2013 and underwent  Procedure(s): LEFT TOTAL HIP ARTHROPLASTY ANTERIOR APPROACH.    Patient was given perioperative antibiotics: Anti-infectives   Start     Dose/Rate Route Frequency Ordered Stop   12/25/13 2100  ceFAZolin (ANCEF) IVPB  1 g/50 mL premix     1 g 100 mL/hr over 30 Minutes Intravenous Every 6 hours 12/25/13 1916 12/26/13 0329   12/25/13 1135  ceFAZolin (ANCEF) IVPB 2 g/50 mL premix     2 g 100 mL/hr over 30 Minutes Intravenous On call to O.R. 12/25/13 1135 12/25/13 1413       Patient was given sequential compression devices, early ambulation, and chemoprophylaxis to prevent DVT.  Patient benefited maximally from hospital stay and there were no complications.    Recent vital signs: Patient Vitals for the past 24 hrs:  BP Temp Temp src Pulse Resp SpO2  12/28/13 0428 110/61 mmHg 102.5 F (39.2 C) Oral 109 20 91 %  12/27/13 2110 130/70 mmHg 101.2 F (38.4 C) Oral 113 20 93 %  12/27/13 1416 113/65 mmHg 99.7 F (37.6 C) Oral 106 18 97 %  12/27/13 1002 121/69 mmHg - - 107 - -     Recent laboratory studies:  Recent Labs  12/26/13 0525 12/27/13 0600 12/28/13 0415  WBC 4.7 9.2 9.7  HGB 9.3* 9.1* 8.8*  HCT 29.2* 28.6* 27.3*  PLT 150 142* 139*  NA 140  --   --   K 4.2  --   --   CL 107  --   --   CO2 25  --   --   BUN 8  --   --  CREATININE 1.26  --   --   GLUCOSE 102*  --   --   CALCIUM 8.0*  --   --      Discharge Medications:     Medication List         allopurinol 100 MG tablet  Commonly known as:  ZYLOPRIM  Take 100 mg by mouth daily.     aspirin 325 MG EC tablet  Take 1 tablet (325 mg total) by mouth 2 (two) times daily after a meal.     atenolol 25 MG tablet  Commonly known as:  TENORMIN  Take 25 mg by mouth every morning.     Cinnamon 500 MG capsule  Take 500 mg by mouth daily.     iron polysaccharides 150 MG capsule  Commonly known as:  NIFEREX  Take 150 mg by mouth 2 (two) times daily.     JOCK ITCH 1 % cream  Generic drug:  clotrimazole  Apply 1 application topically 2 (two) times daily.     LIPITOR 20 MG tablet  Generic drug:  atorvastatin  Take 20 mg by mouth at bedtime.     multivitamin with minerals Tabs tablet  Take 1 tablet by mouth daily.      oxyCODONE-acetaminophen 5-325 MG per tablet  Commonly known as:  ROXICET  Take 1-2 tablets by mouth every 4 (four) hours as needed for severe pain.     ranitidine 150 MG tablet  Commonly known as:  ZANTAC  Take 150 mg by mouth as needed for heartburn.        Diagnostic Studies: Dg Hip Complete Left  12/25/2013   CLINICAL DATA:  Avascular necrosis of the left femoral head.  EXAM: LEFT HIP - COMPLETE 2+ VIEW  COMPARISON:  Radiographs dated 11/23/2013  FINDINGS: C-arm images in the AP projection demonstrate the patient has undergone left total hip prosthesis insertion. Acetabular and femoral components appear in excellent position in the AP projection. No fractures.  IMPRESSION: Satisfactory appearance of the left hip in the AP projection after left total hip prosthesis insertion.   Electronically Signed   By: Rozetta Nunnery M.D.   On: 12/25/2013 16:01   Dg Pelvis Portable  12/25/2013   CLINICAL DATA:  Postop evaluation  EXAM: PORTABLE PELVIS 1-2 VIEWS  COMPARISON:  None.  FINDINGS: Patient is status post total left hip arthroplasty. Hardware is intact. Native osseous structures unremarkable. Degenerative changes are appreciated within the lower lumbar spine.  IMPRESSION: Patient is status post left hip arthroplasty.   Electronically Signed   By: Margaree Mackintosh M.D.   On: 12/25/2013 16:59   Dg Hip Portable 1 View Left  12/25/2013   CLINICAL DATA:  postop  EXAM: PORTABLE LEFT HIP - 1 VIEW  COMPARISON:  None.  FINDINGS: Patient is status post left hip arthroplasty. Hardware intact. Native osseous structures are grossly unremarkable. Postsurgical changes within the surrounding soft tissues.  IMPRESSION: Patient is status post left hip arthroplasty.   Electronically Signed   By: Margaree Mackintosh M.D.   On: 12/25/2013 17:00   Dg C-arm 1-60 Min-no Report  12/25/2013   CLINICAL DATA: left anterior hip   C-ARM 1-60 MINUTES  Fluoroscopy was utilized by the requesting physician.  No radiographic  interpretation.      Disposition: 01-Home or Self Care      Discharge Orders   Future Appointments Provider Department Dept Phone   03/03/2014 9:00 AM [facility] [phone]  03/03/2014 10:00 AM [facility] [phone]   Liquids only 4 hours prior to your exam. Any medications can be taken as usual. Please arrive 15 min prior to your scheduled exam time.   03/05/2014 10:15 AM [facility] [phone]   Future Orders Complete By Expires   Call MD / Call 911  As directed    Constipation Prevention  As directed    Diet - low sodium heart healthy  As directed    Discharge instructions  As directed    Discharge patient  As directed    Increase activity slowly as tolerated  As directed       Follow-up Information   Follow up with [facility]. [facility])    Contact information:   [address] [place] [address] [phone]       Follow up with Mcarthur Rossetti, MD In 2 weeks.   Specialty:  Orthopedic Surgery   Contact information:   [place] [address] [phone]        Signed: Mcarthur Rossetti 12/28/2013, 7:30 AM

## 2013-12-28 NOTE — Progress Notes (Signed)
Physical Therapy Treatment Patient Details Name: [name] MRN: [id number] DOB: 13-Feb-1941 Today's Date: 12/28/2013    History of Present Illness      PT Comments    POD # 3 assisted pt OOB to amb in hallway then practice stairs with spouse present.  Instructed on proper sequencing using RW up backward approach and safe handing.  Instructed on car transfers, use of ICE, HEP along with handout and frequency.  Pt plans to D/C to home today.   Follow Up Recommendations        Equipment Recommendations       Recommendations for Other Services       Precautions / Restrictions Precautions Precautions: None Restrictions Weight Bearing Restrictions: No    Mobility  Bed Mobility Overal bed mobility: Modified Independent             General bed mobility comments: increased time  Transfers Overall transfer level: Modified independent Equipment used: Rolling walker (2 wheeled) Transfers: Sit to/from Stand Sit to Stand: Modified independent (Device/Increase time)         General transfer comment: good safety cognition  Ambulation/Gait Ambulation/Gait assistance: Supervision Ambulation Distance (Feet): 85 Feet Assistive device: Rolling walker (2 wheeled) Gait Pattern/deviations: Step-to pattern Gait velocity: decreased       Stairs Stairs: Yes Stairs assistance: Min assist Stair Management: No rails   General stair comments: performed stairs with spouse and educated on safe handling tech and proper sequencing  Wheelchair Mobility    Modified Rankin (Stroke Patients Only)       Balance                                    Cognition                            Exercises      General Comments        Pertinent Vitals/Pain C/o "soreness" ICE    Home Living                      Prior Function            PT Goals (current goals can now be found in the care plan section) Acute Rehab PT Goals Patient Stated  Goal: home on Monday    Frequency       PT Plan      Co-evaluation             End of Session           Time: 1017-1002 PT Time Calculation (min): 1425 min  Charges:  $Gait Training: 8-22 mins $Self Care/Home Management: 8-22                    G Codes:      Rica Koyanagi  PTA [facility]  Acute  Rehab Pager      [phone]

## 2013-12-28 NOTE — Progress Notes (Signed)
Patient ID: [name], male   DOB: [birth year], 73 y.o.   MRN: [id number] No acute changes.  Did have a fever this am, but lungs very clear.  No cough or wheezing.  Urine is clear.  Left hip incision clean, dry, intact with no redness.  H/H stable.  Asymptomatic acute blood loss anemia.  Can discharge to home today.

## 2013-12-28 NOTE — Progress Notes (Signed)
Occupational Therapy Treatment Patient Details Name: [name] MRN: [id number] DOB: Feb 19, 1941 Today's Date: 12/28/2013          Follow Up Recommendations  No OT follow up    Equipment Recommendations  None recommended by OT    Recommendations for Other Services      Precautions / Restrictions Restrictions Weight Bearing Restrictions: No       Mobility Bed Mobility                  Transfers Overall transfer level: Needs assistance Equipment used: Rolling walker (2 wheeled) Transfers: Sit to/from Stand Sit to Stand: Supervision              Balance                                   ADL                                         General ADL Comments: Pt took a shower this am and was able to use technique OT taught pt last OT session. Pt overall min A with ADL activity and wife will A as needed. Education complete regarding ADL activity       Vision                     Perception     Praxis      Cognition                             Extremity/Trunk Assessment               Exercises     Shoulder Instructions       General Comments        Home Living                                          Prior Functioning/Environment              Frequency Min 2X/week     Progress Toward Goals  OT Goals(current goals can now be found in the care plan section)  Progress towards OT goals: Progressing toward goals  Acute Rehab OT Goals Patient Stated Goal: home on Monday  Plan      Co-evaluation                 End of Session Equipment Utilized During Treatment: Gait belt;Rolling walker   Activity Tolerance Patient tolerated treatment well   Patient Left in chair;with family/visitor present (built up)   Nurse Communication Mobility status (NT)        Time: 3354-5625 OT Time Calculation (min): 9 min  Charges: OT General Charges $OT Visit: 1  Procedure OT Treatments $Self Care/Home Management : 8-22 mins  Betsy Pries 12/28/2013, 11:28 AM

## 2014-01-06 ENCOUNTER — Telehealth: Payer: Self-pay | Admitting: *Deleted

## 2014-01-29 ENCOUNTER — Encounter: Payer: Self-pay | Admitting: Gastroenterology

## 2014-01-29 NOTE — Telephone Encounter (Signed)
He is correct.  2017 is the recall date

## 2014-01-29 NOTE — Telephone Encounter (Signed)
PER DR KAPLAN  PATIENTS RECALL NEEDS TO BE CHANGED TILL 2017  PATIENT INFORMED

## 2014-01-29 NOTE — Telephone Encounter (Signed)
Dr Deatra Ina, I called this patient to schedule his recall colonoscopy for [facility]... He seems to think he should not be due at this time . He wants his colonoscopy report reviewed to double check to make sure he is not due at this time

## 2014-03-03 ENCOUNTER — Other Ambulatory Visit (HOSPITAL_BASED_OUTPATIENT_CLINIC_OR_DEPARTMENT_OTHER): Payer: Medicare Other

## 2014-03-03 ENCOUNTER — Ambulatory Visit (HOSPITAL_COMMUNITY): Payer: Medicare Other

## 2014-03-03 ENCOUNTER — Encounter (HOSPITAL_COMMUNITY): Payer: Self-pay

## 2014-03-03 ENCOUNTER — Ambulatory Visit (HOSPITAL_COMMUNITY)
Admission: RE | Admit: 2014-03-03 | Discharge: 2014-03-03 | Disposition: A | Discharge status: Home / Self Care | Payer: Medicare Other | Source: Ambulatory Visit | Attending: Nurse Practitioner | Admitting: Nurse Practitioner

## 2014-03-03 DIAGNOSIS — D500 Iron deficiency anemia secondary to blood loss (chronic): Secondary | ICD-10-CM

## 2014-03-03 DIAGNOSIS — R911 Solitary pulmonary nodule: Secondary | ICD-10-CM

## 2014-03-03 DIAGNOSIS — D509 Iron deficiency anemia, unspecified: Secondary | ICD-10-CM

## 2014-03-03 DIAGNOSIS — J438 Other emphysema: Secondary | ICD-10-CM | POA: Insufficient documentation

## 2014-03-03 DIAGNOSIS — J984 Other disorders of lung: Secondary | ICD-10-CM | POA: Insufficient documentation

## 2014-03-03 DIAGNOSIS — Q2733 Arteriovenous malformation of digestive system vessel: Secondary | ICD-10-CM

## 2014-03-03 DIAGNOSIS — D472 Monoclonal gammopathy: Secondary | ICD-10-CM

## 2014-03-03 LAB — CBC WITH DIFFERENTIAL/PLATELET
BASO%: 0.5 % (ref 0.0–2.0)
Basophils Absolute: 0 10*3/uL (ref 0.0–0.1)
EOS%: 3.3 % (ref 0.0–7.0)
Eosinophils Absolute: 0.1 10*3/uL (ref 0.0–0.5)
HCT: 35.8 % — ABNORMAL LOW (ref 38.4–49.9)
HGB: 11.2 g/dL — ABNORMAL LOW (ref 13.0–17.1)
LYMPH%: 24 % (ref 14.0–49.0)
MCH: 21.9 pg — ABNORMAL LOW (ref 27.2–33.4)
MCHC: 31.3 g/dL — AB (ref 32.0–36.0)
MCV: 69.9 fL — ABNORMAL LOW (ref 79.3–98.0)
MONO#: 0.6 10*3/uL (ref 0.1–0.9)
MONO%: 15.4 % — ABNORMAL HIGH (ref 0.0–14.0)
NEUT#: 2.3 10*3/uL (ref 1.5–6.5)
NEUT%: 56.8 % (ref 39.0–75.0)
PLATELETS: 206 10*3/uL (ref 140–400)
RBC: 5.12 10*6/uL (ref 4.20–5.82)
RDW: 18.1 % — ABNORMAL HIGH (ref 11.0–14.6)
WBC: 4 10*3/uL (ref 4.0–10.3)
lymph#: 1 10*3/uL (ref 0.9–3.3)

## 2014-03-03 LAB — COMPREHENSIVE METABOLIC PANEL (CC13)
ALK PHOS: 60 U/L (ref 40–150)
ALT: 20 U/L (ref 0–55)
AST: 21 U/L (ref 5–34)
Albumin: 3.9 g/dL (ref 3.5–5.0)
Anion Gap: 8 mEq/L (ref 3–11)
BILIRUBIN TOTAL: 0.63 mg/dL (ref 0.20–1.20)
BUN: 9 mg/dL (ref 7.0–26.0)
CO2: 23 mEq/L (ref 22–29)
CREATININE: 1.1 mg/dL (ref 0.7–1.3)
Calcium: 9 mg/dL (ref 8.4–10.4)
Chloride: 110 mEq/L — ABNORMAL HIGH (ref 98–109)
Glucose: 98 mg/dl (ref 70–140)
Potassium: 4 mEq/L (ref 3.5–5.1)
Sodium: 141 mEq/L (ref 136–145)
TOTAL PROTEIN: 7.7 g/dL (ref 6.4–8.3)

## 2014-03-03 LAB — FERRITIN CHCC: Ferritin: 35 ng/ml (ref 22–316)

## 2014-03-05 ENCOUNTER — Other Ambulatory Visit: Payer: Medicare Other

## 2014-03-16 ENCOUNTER — Other Ambulatory Visit: Payer: Self-pay | Admitting: Oncology

## 2014-03-16 DIAGNOSIS — D509 Iron deficiency anemia, unspecified: Secondary | ICD-10-CM

## 2014-03-16 DIAGNOSIS — R911 Solitary pulmonary nodule: Secondary | ICD-10-CM

## 2014-03-16 DIAGNOSIS — D472 Monoclonal gammopathy: Secondary | ICD-10-CM

## 2014-03-16 DIAGNOSIS — Q2733 Arteriovenous malformation of digestive system vessel: Secondary | ICD-10-CM

## 2014-03-17 ENCOUNTER — Telehealth: Payer: Self-pay | Admitting: *Deleted

## 2014-03-17 NOTE — Telephone Encounter (Signed)
Message copied by Ebbie Latus on Wed Mar 17, 2014  3:53 PM ------      Message from: Annia Belt      Created: Tue Mar 16, 2014  4:06 PM       Call pt:  Blood count stable. Stay on iron pills.  We will repeat lab in September and visit with Dr Darnell Level.  Office will call ------

## 2014-03-17 NOTE — Telephone Encounter (Signed)
Called pt -  Pt informed blood count stable and to stay on his iron pills. And the office will call w/appts  Pt voiced understanding.

## 2014-06-08 ENCOUNTER — Encounter: Payer: Self-pay | Admitting: Oncology

## 2014-06-08 ENCOUNTER — Ambulatory Visit (INDEPENDENT_AMBULATORY_CARE_PROVIDER_SITE_OTHER): Payer: Medicare Other | Admitting: Oncology

## 2014-06-08 VITALS — BP 145/75 | HR 80 | Temp 97.6°F | Ht 74.0 in | Wt 220.9 lb

## 2014-06-08 DIAGNOSIS — D472 Monoclonal gammopathy: Secondary | ICD-10-CM

## 2014-06-08 DIAGNOSIS — Q2733 Arteriovenous malformation of digestive system vessel: Secondary | ICD-10-CM

## 2014-06-08 DIAGNOSIS — D509 Iron deficiency anemia, unspecified: Secondary | ICD-10-CM

## 2014-06-08 LAB — CBC WITH DIFFERENTIAL/PLATELET
BASOS PCT: 1 % (ref 0–1)
Basophils Absolute: 0 10*3/uL (ref 0.0–0.1)
EOS ABS: 0.1 10*3/uL (ref 0.0–0.7)
EOS PCT: 3 % (ref 0–5)
HEMATOCRIT: 36.8 % — AB (ref 39.0–52.0)
HEMOGLOBIN: 12.6 g/dL — AB (ref 13.0–17.0)
Lymphocytes Relative: 21 % (ref 12–46)
Lymphs Abs: 0.8 10*3/uL (ref 0.7–4.0)
MCH: 26.1 pg (ref 26.0–34.0)
MCHC: 34.2 g/dL (ref 30.0–36.0)
MCV: 76.3 fL — AB (ref 78.0–100.0)
MONO ABS: 0.8 10*3/uL (ref 0.1–1.0)
Monocytes Relative: 22 % — ABNORMAL HIGH (ref 3–12)
NEUTROS PCT: 53 % (ref 43–77)
Neutro Abs: 2 10*3/uL (ref 1.7–7.7)
Platelets: 188 10*3/uL (ref 150–400)
RBC: 4.82 MIL/uL (ref 4.22–5.81)
RDW: 20.8 % — ABNORMAL HIGH (ref 11.5–15.5)
WBC: 3.8 10*3/uL — ABNORMAL LOW (ref 4.0–10.5)

## 2014-06-08 LAB — COMPREHENSIVE METABOLIC PANEL
ALBUMIN: 4.2 g/dL (ref 3.5–5.2)
ALK PHOS: 55 U/L (ref 39–117)
ALT: 24 U/L (ref 0–53)
AST: 27 U/L (ref 0–37)
BILIRUBIN TOTAL: 0.7 mg/dL (ref 0.2–1.2)
BUN: 9 mg/dL (ref 6–23)
CO2: 26 meq/L (ref 19–32)
Calcium: 9.2 mg/dL (ref 8.4–10.5)
Chloride: 106 mEq/L (ref 96–112)
Creat: 1.25 mg/dL (ref 0.50–1.35)
GLUCOSE: 80 mg/dL (ref 70–99)
Potassium: 4.8 mEq/L (ref 3.5–5.3)
SODIUM: 141 meq/L (ref 135–145)
Total Protein: 7.2 g/dL (ref 6.0–8.3)

## 2014-06-08 LAB — IRON AND TIBC
%SAT: 15 % — ABNORMAL LOW (ref 20–55)
IRON: 58 ug/dL (ref 42–165)
TIBC: 376 ug/dL (ref 215–435)
UIBC: 318 ug/dL (ref 125–400)

## 2014-06-08 LAB — FERRITIN: Ferritin: 29 ng/mL (ref 22–322)

## 2014-06-08 NOTE — Progress Notes (Signed)
Patient ID: [name], male   DOB: [birth year], 73 y.o.   MRN: [id number] Hematology and Oncology Follow Up Visit  Zidane Renner [id number] [birth year] 73 y.o. 06/08/2014 10:57 AM   Principle Diagnosis: Encounter Diagnoses  Name Primary?  Marland Kitchen Unspecified iron deficiency anemia Yes  . ARTERIOVENOUS MALFORMATION, COLON   . Monoclonal gammopathy of undetermined significance      Interim History:    Followup visit for this pleasant 73 year old man initially referred to me  for evaluation of iron deficiency anemia. 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 Please see my office summary note dated 04/07/2013 for complete details of his previous anemia evaluation. He has required periodic iron infusions.   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 He was also found to have positive sickle trait. No evidence for any hemolytic process.  At the time of my examination on 04/07/2013, I felt an approximate 3 cm node in his right axilla. No other areas of adenopathy. I obtained a CT scan of the chest abdomen and pelvis done on July 31. The node that I was able to palpate clinically was not visible on the CT scan. There are no other significant areas of adenopathy or organomegaly. He has a few tiny subcentimeter nodules in his lungs and some calcified pleural plaquing. He denies any asbestos exposure. He stopped smoking over 20 years ago.  I  referred him for a lymph node biopsy. by Dr. Erroll Luna. Biopsy done September 01, 2013 which showed benign lymphadenitis. No evidence for lymphoma.  Since his last visit with me he had his left hip replaced by Dr. Zollie Beckers on April 10. Surgery went smoothly. He is ambulating without a cane and not using pain medication. He has had no other interim medical problems.  He is  still taking an iron supplement twice a day. He has not seen any blood in his stools.  A small 5 mm pulmonary nodule seen on a CT scan in July of 2014 was unchanged on a followup study 03/03/2014. Since he stopped smoking over 20 years ago he should not be at increased risk for lung cancer at this point.  have .     Medications: Current outpatient prescriptions:allopurinol (ZYLOPRIM) 100 MG tablet, Take 100 mg by mouth daily. , Disp: , Rfl: ;  aspirin EC 325 MG EC tablet, Take 1 tablet (325 mg total) by mouth 2 (two) times daily after a meal., Disp: 30 tablet, Rfl: 0;  atenolol (TENORMIN) 25 MG tablet, Take 25 mg by mouth every morning. , Disp: , Rfl: ;  atorvastatin (LIPITOR) 20 MG tablet, Take 20 mg by mouth at bedtime. , Disp: , Rfl:  iron polysaccharides (NIFEREX) 150 MG capsule, Take 150 mg by mouth 2 (two) times daily. , Disp: , Rfl: ;  Multiple Vitamin (MULTIVITAMIN WITH MINERALS) TABS tablet, Take 1 tablet by mouth daily., Disp: , Rfl: ;  Cinnamon 500 MG capsule, Take 500 mg by mouth daily.  , Disp: , Rfl: ;  clotrimazole (JOCK ITCH) 1 % cream, Apply 1 application topically 2 (two) times daily., Disp: , Rfl:  oxyCODONE-acetaminophen (ROXICET) 5-325 MG per tablet, Take 1-2 tablets by mouth every 4 (four) hours as needed for severe pain., Disp: 60 tablet, Rfl: 0;  ranitidine (ZANTAC) 150 MG tablet, Take 150 mg by mouth as needed for  heartburn., Disp: , Rfl:   Allergies: No Known Allergies  Review of Systems: Hematology:  No bleeding or bruising ENT ROS: No sore throat Breast ROS:  Respiratory ROS: No cough or dyspnea Cardiovascular ROS:  No chest pain or palpitations Gastrointestinal ROS: No abdominal pain or change in bowel habits. No hematochezia.  Stool is black from iron supplement Genito-Urinary ROS: Not questioned Musculoskeletal ROS: See above Neurological ROS: No headache or change in vision Dermatological ROS: No rash or ecchymosis Remaining ROS negative:   Physical  Exam: Blood pressure 145/75, pulse 80, temperature 97.6 F (36.4 C), temperature source Oral, height 6\' 2"  (1.88 m), weight 220 lb 14.4 oz (100.2 kg), SpO2 100.00%. Wt Readings from Last 3 Encounters:  06/08/14 220 lb 14.4 oz (100.2 kg)  12/25/13 216 lb (97.977 kg)  12/25/13 216 lb (97.977 kg)     General appearance: Tall, friendly, well-nourished, African American man HENNT: Pharynx no erythema, exudate, mass, or ulcer. No thyromegaly or thyroid nodules Lymph nodes: No cervical, supraclavicular, or axillary lymphadenopathy Breasts:  Lungs: Clear to auscultation, resonant to percussion throughout Heart: Regular rhythm, no murmur, no gallop, no rub, no click, no edema Abdomen: Soft, nontender, normal bowel sounds, no mass, no organomegaly Extremities: No edema, no calf tenderness Musculoskeletal: no joint deformities. Recent left hip replacement. GU:  Vascular: Carotid pulses 2+, no bruits,  Neurologic: Alert, oriented, PERRLA, pronounced arcus senilis, cranial nerves grossly normal, motor strength 5 over 5, reflexes 1+ symmetric, upper body coordination normal, gait normal, Skin: No rash or ecchymosis  Lab Results: CBC W/Diff    Component Value Date/Time   WBC 4.0 03/03/2014 0926   WBC 9.7 12/28/2013 0415   WBC 6.2 01/03/2013 1104   RBC 5.12 03/03/2014 0926   RBC 3.59* 12/28/2013 0415   RBC 4.84 01/03/2013 1104   HGB 11.2* 03/03/2014 0926   HGB 8.8* 12/28/2013 0415   HGB 12.8* 01/03/2013 1104   HCT 35.8* 03/03/2014 0926   HCT 27.3* 12/28/2013 0415   HCT 41.4* 01/03/2013 1104   PLT 206 03/03/2014 0926   PLT 139* 12/28/2013 0415   MCV 69.9* 03/03/2014 0926   MCV 76.0* 12/28/2013 0415   MCV 85.5 01/03/2013 1104   MCH 21.9* 03/03/2014 0926   MCH 24.5* 12/28/2013 0415   MCH 26.4* 01/03/2013 1104   MCHC 31.3* 03/03/2014 0926   MCHC 32.2 12/28/2013 0415   MCHC 30.9* 01/03/2013 1104   RDW 18.1* 03/03/2014 0926   RDW 16.7* 12/28/2013 0415   LYMPHSABS 1.0 03/03/2014 0926   LYMPHSABS 0.9 11/29/2011  1032   MONOABS 0.6 03/03/2014 0926   MONOABS 1.0 11/29/2011 1032   EOSABS 0.1 03/03/2014 0926   EOSABS 0.1 11/29/2011 1032   BASOSABS 0.0 03/03/2014 0926   BASOSABS 0.0 11/29/2011 1032     Chemistry      Component Value Date/Time   NA 141 03/03/2014 0926   NA 140 12/26/2013 0525   K 4.0 03/03/2014 0926   K 4.2 12/26/2013 0525   CL 107 12/26/2013 0525   CL 108* 09/26/2012 0928   CO2 23 03/03/2014 0926   CO2 25 12/26/2013 0525   BUN 9.0 03/03/2014 0926   BUN 8 12/26/2013 0525   CREATININE 1.1 03/03/2014 0926   CREATININE 1.26 12/26/2013 0525   CREATININE 1.22 01/03/2013 1039      Component Value Date/Time   CALCIUM 9.0 03/03/2014 0926   CALCIUM 8.0* 12/26/2013 0525   ALKPHOS 60 03/03/2014 0926   ALKPHOS 56 12/21/2011 1019   AST 21  03/03/2014 0926   AST 32 12/21/2011 1019   ALT 20 03/03/2014 0926   ALT 23 12/21/2011 1019   BILITOT 0.63 03/03/2014 0926   BILITOT 0.6 12/21/2011 1019       Radiological Studies: See discussion above   Impression:  #1. 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 He remains on iron supplement. Hemoglobin today 12.6: better than his best baseline  #2. IgG lambda monoclonal gammopathy undetermined significance Repeat lab pending but IgG levels have been stable over time: IgG level remains in normal range done 06/08/14  #3. Stable subcentimeter pulmonary nodules and calcified pleural plaques Low risk for malignancy  #4. Status post excision of a benign reactive left axillary lymph node  #5. 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 Essential hypertension  #7. Sickle cell trait   CC: Patient Care Team: Haywood Pao, MD as PCP - General (Internal Medicine)   Annia Belt, MD 9/22/201510:57 AM

## 2014-06-08 NOTE — Patient Instructions (Signed)
To lab today Return visit 6 months Same lab as today 1 week before visit

## 2014-06-09 LAB — KAPPA/LAMBDA LIGHT CHAINS
KAPPA FREE LGHT CHN: 2.23 mg/dL — AB (ref 0.33–1.94)
Kappa:Lambda Ratio: 0.42 (ref 0.26–1.65)
LAMBDA FREE LGHT CHN: 5.36 mg/dL — AB (ref 0.57–2.63)

## 2014-06-09 LAB — IGG, IGA, IGM
IGA: 361 mg/dL (ref 68–379)
IGM, SERUM: 68 mg/dL (ref 41–251)
IgG (Immunoglobin G), Serum: 1320 mg/dL (ref 650–1600)

## 2014-06-09 NOTE — Addendum Note (Signed)
Addended by: Truddie Crumble on: 06/09/2014 10:41 AM   Modules accepted: Orders

## 2014-06-10 ENCOUNTER — Telehealth: Payer: Self-pay | Admitting: *Deleted

## 2014-06-10 LAB — IMMUNOFIXATION ELECTROPHORESIS
IGG (IMMUNOGLOBIN G), SERUM: 1320 mg/dL (ref 650–1600)
IgA: 361 mg/dL (ref 68–379)
IgM, Serum: 68 mg/dL (ref 41–251)
Total Protein, Serum Electrophoresis: 7.2 g/dL (ref 6.0–8.3)

## 2014-06-10 NOTE — Telephone Encounter (Signed)
Pt called- no answer; message ,left that his blood counts are good and to stay on his Iron per Dr Beryle Beams. And to call if he has any questions.

## 2014-06-10 NOTE — Telephone Encounter (Signed)
Message copied by Ebbie Latus on Thu Jun 10, 2014 11:13 AM ------      Message from: Annia Belt      Created: Wed Jun 09, 2014  8:45 AM       Call pt: blood counts good. Stay on iron ------

## 2014-10-28 ENCOUNTER — Encounter: Payer: Self-pay | Admitting: *Deleted

## 2014-12-14 ENCOUNTER — Other Ambulatory Visit: Payer: Medicare Other

## 2014-12-21 ENCOUNTER — Ambulatory Visit: Payer: Medicare Other | Admitting: Oncology

## 2015-01-11 ENCOUNTER — Other Ambulatory Visit (INDEPENDENT_AMBULATORY_CARE_PROVIDER_SITE_OTHER): Payer: Medicare Other

## 2015-01-11 ENCOUNTER — Other Ambulatory Visit: Payer: Self-pay | Admitting: *Deleted

## 2015-01-11 DIAGNOSIS — D472 Monoclonal gammopathy: Secondary | ICD-10-CM

## 2015-01-11 DIAGNOSIS — D509 Iron deficiency anemia, unspecified: Secondary | ICD-10-CM | POA: Diagnosis not present

## 2015-01-11 DIAGNOSIS — K5520 Angiodysplasia of colon without hemorrhage: Secondary | ICD-10-CM

## 2015-01-11 DIAGNOSIS — Q2733 Arteriovenous malformation of digestive system vessel: Secondary | ICD-10-CM

## 2015-01-11 LAB — CBC WITH DIFFERENTIAL/PLATELET
BASOS ABS: 0 10*3/uL (ref 0.0–0.1)
BASOS PCT: 0 % (ref 0–1)
EOS ABS: 0.1 10*3/uL (ref 0.0–0.7)
Eosinophils Relative: 3 % (ref 0–5)
HCT: 39.2 % (ref 39.0–52.0)
Hemoglobin: 12.4 g/dL — ABNORMAL LOW (ref 13.0–17.0)
Lymphocytes Relative: 30 % (ref 12–46)
Lymphs Abs: 1.3 10*3/uL (ref 0.7–4.0)
MCH: 24.4 pg — AB (ref 26.0–34.0)
MCHC: 31.6 g/dL (ref 30.0–36.0)
MCV: 77.2 fL — AB (ref 78.0–100.0)
MONO ABS: 0.8 10*3/uL (ref 0.1–1.0)
MONOS PCT: 18 % — AB (ref 3–12)
MPV: 9.7 fL (ref 8.6–12.4)
Neutro Abs: 2.1 10*3/uL (ref 1.7–7.7)
Neutrophils Relative %: 49 % (ref 43–77)
Platelets: 206 10*3/uL (ref 150–400)
RBC: 5.08 MIL/uL (ref 4.22–5.81)
RDW: 17.4 % — ABNORMAL HIGH (ref 11.5–15.5)
WBC: 4.3 10*3/uL (ref 4.0–10.5)

## 2015-01-11 LAB — COMPLETE METABOLIC PANEL WITH GFR
ALBUMIN: 4.1 g/dL (ref 3.5–5.2)
ALT: 24 U/L (ref 0–53)
AST: 25 U/L (ref 0–37)
Alkaline Phosphatase: 58 U/L (ref 39–117)
BUN: 14 mg/dL (ref 6–23)
CALCIUM: 8.9 mg/dL (ref 8.4–10.5)
CO2: 22 meq/L (ref 19–32)
Chloride: 107 mEq/L (ref 96–112)
Creat: 1.13 mg/dL (ref 0.50–1.35)
GFR, EST NON AFRICAN AMERICAN: 65 mL/min
GFR, Est African American: 75 mL/min
Glucose, Bld: 86 mg/dL (ref 70–99)
Potassium: 4.7 mEq/L (ref 3.5–5.3)
Sodium: 140 mEq/L (ref 135–145)
TOTAL PROTEIN: 7.9 g/dL (ref 6.0–8.3)
Total Bilirubin: 0.9 mg/dL (ref 0.2–1.2)

## 2015-01-11 LAB — IRON AND TIBC
%SAT: 7 % — ABNORMAL LOW (ref 20–55)
IRON: 29 ug/dL — AB (ref 42–165)
TIBC: 441 ug/dL — ABNORMAL HIGH (ref 215–435)
UIBC: 412 ug/dL — ABNORMAL HIGH (ref 125–400)

## 2015-01-11 NOTE — Addendum Note (Signed)
Addended by: Truddie Crumble on: 01/11/2015 10:10 AM   Modules accepted: Orders

## 2015-01-12 LAB — KAPPA/LAMBDA LIGHT CHAINS
Kappa free light chain: 2.89 mg/dL — ABNORMAL HIGH (ref 0.33–1.94)
Kappa:Lambda Ratio: 0.49 (ref 0.26–1.65)
Lambda Free Lght Chn: 5.91 mg/dL — ABNORMAL HIGH (ref 0.57–2.63)

## 2015-01-12 LAB — IGG, IGA, IGM
IgA: 404 mg/dL — ABNORMAL HIGH (ref 68–379)
IgG (Immunoglobin G), Serum: 1690 mg/dL — ABNORMAL HIGH (ref 650–1600)
IgM, Serum: 76 mg/dL (ref 41–251)

## 2015-01-12 LAB — FERRITIN: Ferritin: 33 ng/mL (ref 22–322)

## 2015-01-13 LAB — IMMUNOFIXATION ELECTROPHORESIS
IGA: 404 mg/dL — AB (ref 68–379)
IGG (IMMUNOGLOBIN G), SERUM: 1690 mg/dL — AB (ref 650–1600)
IGM, SERUM: 76 mg/dL (ref 41–251)
TOTAL PROTEIN, SERUM ELECTROPHOR: 7.9 g/dL (ref 6.0–8.3)

## 2015-01-18 ENCOUNTER — Ambulatory Visit (INDEPENDENT_AMBULATORY_CARE_PROVIDER_SITE_OTHER): Payer: Medicare Other | Admitting: Oncology

## 2015-01-18 ENCOUNTER — Encounter: Payer: Self-pay | Admitting: Oncology

## 2015-01-18 VITALS — BP 136/68 | HR 80 | Temp 98.2°F | Ht 74.0 in | Wt 221.8 lb

## 2015-01-18 DIAGNOSIS — D509 Iron deficiency anemia, unspecified: Secondary | ICD-10-CM

## 2015-01-18 DIAGNOSIS — R918 Other nonspecific abnormal finding of lung field: Secondary | ICD-10-CM | POA: Diagnosis not present

## 2015-01-18 DIAGNOSIS — Z9889 Other specified postprocedural states: Secondary | ICD-10-CM

## 2015-01-18 DIAGNOSIS — M1612 Unilateral primary osteoarthritis, left hip: Secondary | ICD-10-CM

## 2015-01-18 DIAGNOSIS — Z96642 Presence of left artificial hip joint: Secondary | ICD-10-CM

## 2015-01-18 DIAGNOSIS — D573 Sickle-cell trait: Secondary | ICD-10-CM

## 2015-01-18 DIAGNOSIS — Z860100 Personal history of colon polyps, unspecified: Secondary | ICD-10-CM

## 2015-01-18 DIAGNOSIS — D472 Monoclonal gammopathy: Secondary | ICD-10-CM | POA: Diagnosis not present

## 2015-01-18 DIAGNOSIS — D500 Iron deficiency anemia secondary to blood loss (chronic): Secondary | ICD-10-CM

## 2015-01-18 DIAGNOSIS — I10 Essential (primary) hypertension: Secondary | ICD-10-CM

## 2015-01-18 DIAGNOSIS — R911 Solitary pulmonary nodule: Secondary | ICD-10-CM

## 2015-01-18 NOTE — Progress Notes (Signed)
Patient ID: Hurshel Bouillon, male   DOB: 1941-06-28, 74 y.o.   MRN: [id number] Hematology and Oncology Follow Up Visit  Akari Crysler [id number] 1941/05/10 74 y.o. 01/18/2015 2:12 PM   Principle Diagnosis: Encounter Diagnoses  Name Primary?  . Monoclonal gammopathy of undetermined significance Yes  . History of colonic polyps   . Iron deficiency anemia   . Solitary pulmonary nodule    clinical summary: 74 year old man initially referred to me for evaluation of iron deficiency anemia. 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 Please see my office summary note dated 04/07/2013 for complete details of his previous anemia evaluation. He has required periodic iron infusions.  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 He was also found to have positive sickle trait. No evidence for any hemolytic process.  At the time of my examination on 04/07/2013, I felt an approximate 3 cm node in his right axilla. No other areas of adenopathy. I obtained a CT scan of the chest abdomen and pelvis done on July 31. The node that I was able to palpate clinically was not visible on the CT scan. There were no other significant areas of adenopathy or organomegaly. He had a few tiny subcentimeter nodules in his lungs and some calcified pleural plaquing. He denies any asbestos exposure. He stopped smoking over 20 years ago.  I referred him for a lymph node biopsy done by  Dr. Erroll Luna., September 01, 2013 which showed benign lymphadenitis. No evidence for lymphoma. Serial CT scans of his chest have not shown any growth of the small, 5 mm, pulmonary nodules. Or change in the pleural plaques.   Interim History:  He is doing well at this time. He has had no interim medical problems. He continues to take oral iron and has been able  to maintain his hemoglobin above 12 g. MCV remains low at 77. Ferritin in the low normal range. I suspect he might also have a alpha thalassemia trait to explain the persistent low MCV. He denies any dyspnea, chest pain, palpitations, headache, change in bowel habit, hematochezia, dysuria or frequency, hematuria. He plans to travel to [place] soon for a graduation ceremony for one of his family.  Medications:  Current outpatient prescriptions:  .  allopurinol (ZYLOPRIM) 100 MG tablet, Take 100 mg by mouth daily. , Disp: , Rfl:  .  aspirin EC 325 MG EC tablet, Take 1 tablet (325 mg total) by mouth 2 (two) times daily after a meal. (Patient not taking: Reported on 01/18/2015), Disp: 30 tablet, Rfl: 0 .  atenolol (TENORMIN) 25 MG tablet, Take 25 mg by mouth every morning. , Disp: , Rfl:  .  atorvastatin (LIPITOR) 20 MG tablet, Take 20 mg by mouth at bedtime. , Disp: , Rfl:  .  Cinnamon 500 MG capsule, Take 500 mg by mouth daily.  , Disp: , Rfl:  .  clotrimazole (JOCK ITCH) 1 % cream, Apply 1 application topically 2 (two) times daily., Disp: , Rfl:  .  iron polysaccharides (NIFEREX) 150 MG capsule, Take 150 mg by mouth 2 (two) times daily. , Disp: , Rfl:  .  Multiple Vitamin (MULTIVITAMIN WITH MINERALS) TABS tablet, Take 1 tablet by mouth daily., Disp: , Rfl:  When necessary Nexium.  Allergies: No Known Allergies  Review of Systems:  Remaining ROS negative:   Physical  Exam: Blood pressure 136/68, pulse 80, temperature 98.2 F (36.8 C), temperature source Oral, height 6\' 2"  (1.88 m), weight 221 lb 12.8 oz (100.608 kg), SpO2 98 %. Wt Readings from Last 3 Encounters:  01/18/15 221 lb 12.8 oz (100.608 kg)  06/08/14 220 lb 14.4 oz (100.2 kg)  12/02/13 218 lb 4.8 oz (99.02 kg)     General appearance: Tall, well-nourished, African-American man HENNT: Pharynx no erythema, exudate, mass, or ulcer. No thyromegaly or thyroid nodules Lymph nodes: No cervical, supraclavicular, or axillary  lymphadenopathy Breasts:  Lungs: Clear to auscultation, resonant to percussion throughout Heart: Regular rhythm, no murmur, no gallop, no rub, no click, no edema Abdomen: Soft, nontender, normal bowel sounds, no mass, no organomegaly Extremities: No edema, no calf tenderness Musculoskeletal: no joint deformities GU:  Vascular: Carotid pulses 2+, no bruits,  Neurologic: Alert, oriented, PERRLA, prominent arcus senilis, cranial nerves grossly normal, motor strength 5 over 5, reflexes absent but symmetric lower extremities, 1+ symmetric upper extremities, upper body coordination normal, gait normal, Skin: No rash or ecchymosis  Lab Results: CBC W/Diff    Component Value Date/Time   WBC 4.3 01/11/2015 0938   WBC 4.0 03/03/2014 0926   WBC 6.2 01/03/2013 1104   RBC 5.08 01/11/2015 0938   RBC 5.12 03/03/2014 0926   RBC 4.84 01/03/2013 1104   HGB 12.4* 01/11/2015 0938   HGB 11.2* 03/03/2014 0926   HGB 12.8* 01/03/2013 1104   HCT 39.2 01/11/2015 0938   HCT 35.8* 03/03/2014 0926   HCT 41.4* 01/03/2013 1104   PLT 206 01/11/2015 0938   PLT 206 03/03/2014 0926   MCV 77.2* 01/11/2015 0938   MCV 69.9* 03/03/2014 0926   MCV 85.5 01/03/2013 1104   MCH 24.4* 01/11/2015 0938   MCH 21.9* 03/03/2014 0926   MCH 26.4* 01/03/2013 1104   MCHC 31.6 01/11/2015 0938   MCHC 31.3* 03/03/2014 0926   MCHC 30.9* 01/03/2013 1104   RDW 17.4* 01/11/2015 0938   RDW 18.1* 03/03/2014 0926   LYMPHSABS 1.3 01/11/2015 0938   LYMPHSABS 1.0 03/03/2014 0926   MONOABS 0.8 01/11/2015 0938   MONOABS 0.6 03/03/2014 0926   EOSABS 0.1 01/11/2015 0938   EOSABS 0.1 03/03/2014 0926   BASOSABS 0.0 01/11/2015 0938   BASOSABS 0.0 03/03/2014 0926     Chemistry      Component Value Date/Time   NA 140 01/11/2015 0938   NA 141 03/03/2014 0926   K 4.7 01/11/2015 0938   K 4.0 03/03/2014 0926   CL 107 01/11/2015 0938   CL 108* 09/26/2012 0928   CO2 22 01/11/2015 0938   CO2 23 03/03/2014 0926   BUN 14 01/11/2015 0938    BUN 9.0 03/03/2014 0926   CREATININE 1.13 01/11/2015 0938   CREATININE 1.1 03/03/2014 0926   CREATININE 1.26 12/26/2013 0525      Component Value Date/Time   CALCIUM 8.9 01/11/2015 0938   CALCIUM 9.0 03/03/2014 0926   ALKPHOS 58 01/11/2015 0938   ALKPHOS 60 03/03/2014 0926   AST 25 01/11/2015 0938   AST 21 03/03/2014 0926   ALT 24 01/11/2015 0938   ALT 20 03/03/2014 0926   BILITOT 0.9 01/11/2015 0938   BILITOT 0.63 03/03/2014 0926    Ferritin 33 on 01/11/2015, iron 29 IgG: 1690 mg percent compare with 1320 mg last year Serum free light chain ratio 0.49 normal 0.26-1.65  Radiological Studies: No results found.  Impression:  #1. 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  He remains on iron supplement. Current hemoglobin stable. I suspect he may partially malabsorb iron to explain why his serum iron and ferritin are still low.  #2. IgG lambda monoclonal gammopathy undetermined significance Repeat lab pending but IgG levels have been stable over time: IgG level slightly higher than his baseline but not in a range that I am concerned about and free light chain ratio remains normal. I will continue periodic observation on an every six-month basis.  #3. Stable subcentimeter pulmonary nodules and calcified pleural plaques Per radiology recommendation I will get one additional follow-up CT scan at this time which will be the third annual scan. If this is unchanged, then just follow with chest x-rays. He stopped smoking more than 15 years ago. I again questioned him about [place] best this exposure and to his knowledge he has not been exposed.  #4. Status post excision of a benign reactive left axillary lymph node  #5. 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 Essential hypertension  #7. Sickle cell trait  #8. 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

## 2015-01-18 NOTE — Patient Instructions (Signed)
Schedule CT scan of chest at [facility] within next 1-2 weeks Repeat lab test in October Follow up visit with Dr Darnell Level 1-2 weeks after lab done

## 2015-02-01 ENCOUNTER — Ambulatory Visit (HOSPITAL_COMMUNITY)
Admission: RE | Admit: 2015-02-01 | Discharge: 2015-02-01 | Disposition: A | Discharge status: Home / Self Care | Payer: Medicare Other | Source: Ambulatory Visit | Attending: Oncology | Admitting: Oncology

## 2015-02-01 DIAGNOSIS — D472 Monoclonal gammopathy: Secondary | ICD-10-CM | POA: Diagnosis not present

## 2015-02-01 DIAGNOSIS — R911 Solitary pulmonary nodule: Secondary | ICD-10-CM | POA: Diagnosis not present

## 2015-02-03 ENCOUNTER — Telehealth: Payer: Self-pay | Admitting: *Deleted

## 2015-02-03 NOTE — Telephone Encounter (Signed)
-----   Message from Annia Belt, MD sent at 02/01/2015  2:46 PM EDT ----- Call pt:  CT chest stable c/w previous studies. No areas of concern.

## 2015-02-03 NOTE — Telephone Encounter (Signed)
Pt called / informed CT chest stable; consistence with previous studies; no areas of concerns per Dr Beryle Beams.  Pt stated -  "No new areas".

## 2015-07-06 ENCOUNTER — Other Ambulatory Visit: Payer: Self-pay | Admitting: Oncology

## 2015-07-06 DIAGNOSIS — Z86010 Personal history of colonic polyps: Secondary | ICD-10-CM

## 2015-07-06 DIAGNOSIS — D509 Iron deficiency anemia, unspecified: Secondary | ICD-10-CM

## 2015-07-06 DIAGNOSIS — D472 Monoclonal gammopathy: Secondary | ICD-10-CM

## 2015-07-08 ENCOUNTER — Other Ambulatory Visit (INDEPENDENT_AMBULATORY_CARE_PROVIDER_SITE_OTHER): Payer: Medicare Other

## 2015-07-08 DIAGNOSIS — D509 Iron deficiency anemia, unspecified: Secondary | ICD-10-CM

## 2015-07-08 DIAGNOSIS — D472 Monoclonal gammopathy: Secondary | ICD-10-CM

## 2015-07-08 DIAGNOSIS — Z86010 Personal history of colonic polyps: Secondary | ICD-10-CM

## 2015-07-09 LAB — CMP14 + ANION GAP
ALBUMIN: 4.4 g/dL (ref 3.5–4.8)
ALT: 22 IU/L (ref 0–44)
ANION GAP: 18 mmol/L (ref 10.0–18.0)
AST: 24 IU/L (ref 0–40)
Albumin/Globulin Ratio: 1.5 (ref 1.1–2.5)
Alkaline Phosphatase: 52 IU/L (ref 39–117)
BILIRUBIN TOTAL: 0.4 mg/dL (ref 0.0–1.2)
BUN / CREAT RATIO: 7 — AB (ref 10–22)
BUN: 9 mg/dL (ref 8–27)
CO2: 20 mmol/L (ref 18–29)
CREATININE: 1.21 mg/dL (ref 0.76–1.27)
Calcium: 8.7 mg/dL (ref 8.6–10.2)
Chloride: 102 mmol/L (ref 97–106)
GFR calc non Af Amer: 59 mL/min/{1.73_m2} — ABNORMAL LOW (ref >59)
GFR, EST AFRICAN AMERICAN: 69 mL/min/{1.73_m2} (ref >59)
GLOBULIN, TOTAL: 3 g/dL (ref 1.5–4.5)
Glucose: 100 mg/dL — ABNORMAL HIGH (ref 65–99)
Potassium: 4.2 mmol/L (ref 3.5–5.2)
SODIUM: 140 mmol/L (ref 136–144)
TOTAL PROTEIN: 7.4 g/dL (ref 6.0–8.5)

## 2015-07-09 LAB — CBC WITH DIFFERENTIAL/PLATELET
BASOS ABS: 0 10*3/uL (ref 0.0–0.2)
Basos: 1 %
EOS (ABSOLUTE): 0.1 10*3/uL (ref 0.0–0.4)
Eos: 3 %
HEMOGLOBIN: 9.1 g/dL — AB (ref 12.6–17.7)
Hematocrit: 30.5 % — ABNORMAL LOW (ref 37.5–51.0)
IMMATURE GRANS (ABS): 0 10*3/uL (ref 0.0–0.1)
IMMATURE GRANULOCYTES: 0 %
LYMPHS: 24 %
Lymphocytes Absolute: 1 10*3/uL (ref 0.7–3.1)
MCH: 21.3 pg — AB (ref 26.6–33.0)
MCHC: 29.8 g/dL — ABNORMAL LOW (ref 31.5–35.7)
MCV: 71 fL — ABNORMAL LOW (ref 79–97)
MONOCYTES: 18 %
Monocytes Absolute: 0.8 10*3/uL (ref 0.1–0.9)
NEUTROS PCT: 54 %
Neutrophils Absolute: 2.3 10*3/uL (ref 1.4–7.0)
PLATELETS: 227 10*3/uL (ref 150–379)
RBC: 4.28 x10E6/uL (ref 4.14–5.80)
RDW: 18.8 % — ABNORMAL HIGH (ref 12.3–15.4)
WBC: 4.2 10*3/uL (ref 3.4–10.8)

## 2015-07-10 LAB — KAPPA/LAMBDA LIGHT CHAINS
IG KAPPA FREE LIGHT CHAIN: 28.45 mg/L — AB (ref 3.30–19.40)
IG LAMBDA FREE LIGHT CHAIN: 51.12 mg/L — AB (ref 5.71–26.30)
KAPPA/LAMBDA FLC RATIO: 0.56 (ref 0.26–1.65)

## 2015-07-11 LAB — IMMUNOFIXATION ELECTROPHORESIS
IgA/Immunoglobulin A, Serum: 331 mg/dL (ref 61–437)
IgG (Immunoglobin G), Serum: 1372 mg/dL (ref 700–1600)
IgM (Immunoglobulin M), Srm: 67 mg/dL (ref 15–143)

## 2015-07-19 ENCOUNTER — Encounter: Payer: Self-pay | Admitting: Oncology

## 2015-07-19 ENCOUNTER — Ambulatory Visit (INDEPENDENT_AMBULATORY_CARE_PROVIDER_SITE_OTHER): Payer: Medicare Other | Admitting: Oncology

## 2015-07-19 VITALS — BP 134/65 | HR 84 | Temp 97.7°F | Ht 74.0 in | Wt 219.9 lb

## 2015-07-19 DIAGNOSIS — D509 Iron deficiency anemia, unspecified: Secondary | ICD-10-CM | POA: Diagnosis not present

## 2015-07-19 DIAGNOSIS — Q2733 Arteriovenous malformation of digestive system vessel: Secondary | ICD-10-CM

## 2015-07-19 DIAGNOSIS — D472 Monoclonal gammopathy: Secondary | ICD-10-CM

## 2015-07-19 NOTE — Progress Notes (Signed)
Patient ID: [name], male   DOB: March 14, 1941, 74 y.o.   MRN: [id number] Hematology and Oncology Follow Up Visit  Lonnell Chaput [id number] May 02, 1941 74 y.o. 07/19/2015 1:52 PM   Principle Diagnosis: Encounter Diagnoses  Name Primary?  . Iron deficiency anemia Yes  . ARTERIOVENOUS MALFORMATION, COLON   . Monoclonal gammopathy of undetermined significance   Clinical Summary: 74 year old man initially referred to me for evaluation of iron deficiency anemia. 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 Please see my office summary note dated 04/07/2013 for complete details of his previous anemia evaluation. He has required periodic iron infusions.  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 He was also found to have positive sickle trait. No evidence for any hemolytic process.  At the time of my examination on 04/07/2013, I felt an approximate 3 cm node in his right axilla. No other areas of adenopathy. I obtained a CT scan of the chest abdomen and pelvis done on July 31. The node that I was able to palpate clinically was not visible on the CT scan. There were no other significant areas of adenopathy or organomegaly. He had a few tiny subcentimeter nodules in his lungs and some calcified pleural plaquing. He denies any asbestos exposure. He stopped smoking over 20 years ago.  I referred him for a lymph node biopsy done by Dr. Erroll Luna., September 01, 2013 which showed benign lymphadenitis. No evidence for lymphoma. Serial CT scans of his chest have not shown any growth of the small, 5 mm, pulmonary nodules. Or change in the pleural plaques. Due to stability over time, radiologist commented at time of most recent 02/01/2015 CT scan. Further CT scanning would not be necessary.   Interim History:   He  continues to do well. He does report increasing fatigue. I note that lab in anticipation of today's visit shows that his hemoglobin has drifted down again to 9.1 on October 21 from most recent value of 12.4 on April 26. MCV down to 71 indicating ongoing GI blood loss from his known AVMs. He has not noted any hematochezia or melena. Stools are dark from oral iron which she continues to take twice daily. He denies any cardiorespiratory complaints. No change in bowel habit. No bone pain. No paresthesias. He tells me he has been contacted by gastroenterology and has routine follow-up endoscopy studies scheduled for April 2017. Last studies were done in October 2012. Other than known AVMs there were no other major pathologic findings. He bought a fancy copper thread glove online to self treat discomfort of his left wrist which she believes is due to carpal tunnel syndrome. Of interest, previously detected IgG lambda monoclonal protein is no longer detected on immunofixation electrophoresis and total serum IgG has normalized.  Medications: reviewed  Allergies: No Known Allergies  Review of Systems: See HPI Remaining ROS negative:   Physical Exam: Blood pressure 134/65, pulse 84, temperature 97.7 F (36.5 C), temperature source Oral, height 6\' 2"  (1.88 m), weight 219 lb 14.4 oz (99.746 kg), SpO2 100 %. Wt Readings from Last 3 Encounters:  07/19/15 219 lb 14.4 oz (99.746 kg)  01/18/15 221 lb 12.8 oz (100.608 kg)  06/08/14 220 lb 14.4 oz (100.2 kg)     General appearance: tall, well nourished African American man HENNT: Pharynx no erythema, exudate, mass, or ulcer.  No thyromegaly or thyroid nodules Lymph nodes: No cervical, supraclavicular, or axillary lymphadenopathy Breasts:  Lungs: Clear to auscultation, resonant to percussion throughout Heart: Regular rhythm, no murmur, no gallop, no rub, no click, no edema Abdomen: Soft, nontender, normal bowel sounds, no mass, no organomegaly Extremities:  No edema, no calf tenderness Musculoskeletal: no joint deformities GU:  Vascular: Carotid pulses 2+, no bruits, Neurologic: Alert, oriented, PERRLA,   cranial nerves grossly normal, motor strength 5 over 5, reflexes 1+ symmetric, upper body coordination normal, gait normal, Skin: No rash or ecchymosis  Lab Results: CBC W/Diff    Component Value Date/Time   WBC 4.2 07/08/2015 1004   WBC 4.3 01/11/2015 0938   WBC 4.0 03/03/2014 0926   WBC 6.2 01/03/2013 1104   RBC 4.28 07/08/2015 1004   RBC 5.08 01/11/2015 0938   RBC 5.12 03/03/2014 0926   RBC 4.84 01/03/2013 1104   HGB 12.4* 01/11/2015 0938   HGB 11.2* 03/03/2014 0926   HGB 12.8* 01/03/2013 1104   HCT 30.5* 07/08/2015 1004   HCT 39.2 01/11/2015 0938   HCT 35.8* 03/03/2014 0926   HCT 41.4* 01/03/2013 1104   PLT 206 01/11/2015 0938   PLT 206 03/03/2014 0926   MCV 77.2* 01/11/2015 0938   MCV 69.9* 03/03/2014 0926   MCV 85.5 01/03/2013 1104   MCH 21.3* 07/08/2015 1004   MCH 24.4* 01/11/2015 0938   MCH 21.9* 03/03/2014 0926   MCH 26.4* 01/03/2013 1104   MCHC 29.8* 07/08/2015 1004   MCHC 31.6 01/11/2015 0938   MCHC 31.3* 03/03/2014 0926   MCHC 30.9* 01/03/2013 1104   RDW 18.8* 07/08/2015 1004   RDW 17.4* 01/11/2015 0938   RDW 18.1* 03/03/2014 0926   LYMPHSABS 1.0 07/08/2015 1004   LYMPHSABS 1.3 01/11/2015 0938   LYMPHSABS 1.0 03/03/2014 0926   MONOABS 0.8 01/11/2015 0938   MONOABS 0.6 03/03/2014 0926   EOSABS 0.1 01/11/2015 0938   EOSABS 0.1 03/03/2014 0926   BASOSABS 0.0 07/08/2015 1004   BASOSABS 0.0 01/11/2015 0938   BASOSABS 0.0 03/03/2014 0926     Chemistry      Component Value Date/Time   NA 140 07/08/2015 1004   NA 140 01/11/2015 0938   NA 141 03/03/2014 0926   K 4.2 07/08/2015 1004   K 4.0 03/03/2014 0926   CL 102 07/08/2015 1004   CL 108* 09/26/2012 0928   CO2 20 07/08/2015 1004   CO2 23 03/03/2014 0926   BUN 9 07/08/2015 1004   BUN 14 01/11/2015 0938   BUN 9.0 03/03/2014 0926   CREATININE 1.21  07/08/2015 1004   CREATININE 1.13 01/11/2015 0938   CREATININE 1.1 03/03/2014 0926      Component Value Date/Time   CALCIUM 8.7 07/08/2015 1004   CALCIUM 9.0 03/03/2014 0926   ALKPHOS 52 07/08/2015 1004   ALKPHOS 60 03/03/2014 0926   AST 24 07/08/2015 1004   AST 21 03/03/2014 0926   ALT 22 07/08/2015 1004   ALT 20 03/03/2014 0926   BILITOT 0.4 07/08/2015 1004   BILITOT 0.9 01/11/2015 0938   BILITOT 0.63 03/03/2014 0926       Radiological Studies: No results found.  Impression:  #1. 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 He remains on an oral iron supplement. I'm going to go ahead and schedule intravenous iron with Feraheme 510 mg weekly 2. Check baseline iron and ferritin levels today. Repeat CBC and iron studies in January to assess response.   #2.  IgG lambda monoclonal gammopathy undetermined significance Studies done in anticipation of today's visit on 07/08/2015 now show normalization of total IgG and disappearance of the monoclonal protein on immunofixation electrophoresis. Serum free light chain ratio remains in the normal range. He has some mild, chronic, renal insufficiency which explains the elevation of both kappa and lambda free light chains but the fact that the ratio is normal supports this is due to medical renal disease and not light chain myeloma.  #3. Stable subcentimeter pulmonary nodules and calcified pleural plaques See discussion above. Recent May 2016 study remained stable and further CT exams not recommended.  #4. Status post excision of a benign reactive left axillary lymph node  #5. 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 Essential hypertension  #7. 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

## 2015-07-19 NOTE — Patient Instructions (Signed)
Schedule IV iron weekly x 2 with [facility] short stay To lab today Repeat lab in January to see effects of the iron treatment Repeat lab January 03, 2016 MD visit with Dr Darnell Level 01/10/16

## 2015-07-20 LAB — FERRITIN: Ferritin: 22 ng/mL — ABNORMAL LOW (ref 30–400)

## 2015-07-20 LAB — IRON AND TIBC
IRON: 15 ug/dL — AB (ref 38–169)
Iron Saturation: 4 % — CL (ref 15–55)
Total Iron Binding Capacity: 392 ug/dL (ref 250–450)
UIBC: 377 ug/dL — ABNORMAL HIGH (ref 111–343)

## 2015-07-22 ENCOUNTER — Other Ambulatory Visit (HOSPITAL_COMMUNITY): Payer: Self-pay | Admitting: *Deleted

## 2015-07-25 ENCOUNTER — Encounter (HOSPITAL_COMMUNITY)
Admission: RE | Admit: 2015-07-25 | Discharge: 2015-07-25 | Disposition: A | Discharge status: Home / Self Care | Payer: Medicare Other | Source: Ambulatory Visit | Attending: Oncology | Admitting: Oncology

## 2015-07-25 VITALS — BP 113/53 | HR 86 | Temp 98.4°F | Resp 20

## 2015-07-25 DIAGNOSIS — Q2733 Arteriovenous malformation of digestive system vessel: Secondary | ICD-10-CM | POA: Insufficient documentation

## 2015-07-25 DIAGNOSIS — D509 Iron deficiency anemia, unspecified: Secondary | ICD-10-CM | POA: Diagnosis not present

## 2015-07-25 MED ORDER — FERUMOXYTOL INJECTION 510 MG/17 ML
510.0000 mg | INTRAVENOUS | Status: DC
  Administered 2015-07-25: 510 mg via INTRAVENOUS
  Filled 2015-07-25: qty 17

## 2015-08-01 ENCOUNTER — Encounter (HOSPITAL_COMMUNITY)
Admission: RE | Admit: 2015-08-01 | Discharge: 2015-08-01 | Disposition: A | Discharge status: Home / Self Care | Payer: Medicare Other | Source: Ambulatory Visit | Attending: Oncology | Admitting: Oncology

## 2015-08-01 VITALS — BP 123/64 | HR 78 | Temp 98.1°F | Resp 20 | Ht 74.0 in | Wt 216.0 lb

## 2015-08-01 DIAGNOSIS — Q2733 Arteriovenous malformation of digestive system vessel: Secondary | ICD-10-CM

## 2015-08-01 DIAGNOSIS — D509 Iron deficiency anemia, unspecified: Secondary | ICD-10-CM | POA: Diagnosis not present

## 2015-08-01 MED ORDER — FERUMOXYTOL INJECTION 510 MG/17 ML
510.0000 mg | INTRAVENOUS | Status: AC
Start: 2015-08-01 — End: 2015-08-01
  Administered 2015-08-01: 510 mg via INTRAVENOUS
  Filled 2015-08-01: qty 17

## 2015-09-27 ENCOUNTER — Other Ambulatory Visit (INDEPENDENT_AMBULATORY_CARE_PROVIDER_SITE_OTHER): Payer: Medicare Other

## 2015-09-27 DIAGNOSIS — D509 Iron deficiency anemia, unspecified: Secondary | ICD-10-CM | POA: Diagnosis not present

## 2015-09-27 DIAGNOSIS — D472 Monoclonal gammopathy: Secondary | ICD-10-CM

## 2015-09-27 DIAGNOSIS — Q2733 Arteriovenous malformation of digestive system vessel: Secondary | ICD-10-CM

## 2015-09-28 ENCOUNTER — Telehealth: Payer: Self-pay | Admitting: *Deleted

## 2015-09-28 LAB — CBC WITH DIFFERENTIAL/PLATELET
BASOS: 1 %
Basophils Absolute: 0 10*3/uL (ref 0.0–0.2)
EOS (ABSOLUTE): 0.1 10*3/uL (ref 0.0–0.4)
EOS: 3 %
HEMATOCRIT: 33.1 % — AB (ref 37.5–51.0)
HEMOGLOBIN: 10.4 g/dL — AB (ref 12.6–17.7)
Immature Grans (Abs): 0 10*3/uL (ref 0.0–0.1)
Immature Granulocytes: 0 %
LYMPHS ABS: 1 10*3/uL (ref 0.7–3.1)
Lymphs: 27 %
MCH: 24.1 pg — AB (ref 26.6–33.0)
MCHC: 31.4 g/dL — AB (ref 31.5–35.7)
MCV: 77 fL — AB (ref 79–97)
MONOS ABS: 0.9 10*3/uL (ref 0.1–0.9)
Monocytes: 22 %
NEUTROS ABS: 1.8 10*3/uL (ref 1.4–7.0)
Neutrophils: 47 %
Platelets: 248 10*3/uL (ref 150–379)
RBC: 4.31 x10E6/uL (ref 4.14–5.80)
RDW: 21.3 % — ABNORMAL HIGH (ref 12.3–15.4)
WBC: 3.8 10*3/uL (ref 3.4–10.8)

## 2015-09-28 NOTE — Telephone Encounter (Signed)
-----   Message from Annia Belt, MD sent at 09/28/2015  9:37 AM EST ----- Call pt: red blood count a little better than in October.  May need more IV iron, We will wait until he has repeat labs in April. Stay on oral iron supplements.

## 2015-09-28 NOTE — Telephone Encounter (Signed)
Pt called / informed  "red blood count a little better than in October. May need more IV iron, We will wait until he has repeat labs in April. Stay on oral iron supplements (pills)" per Dr Beryle Beams. Voiced understanding.

## 2015-10-25 ENCOUNTER — Encounter: Payer: Self-pay | Admitting: Gastroenterology

## 2015-11-16 ENCOUNTER — Encounter: Payer: Self-pay | Admitting: Gastroenterology

## 2016-01-03 ENCOUNTER — Other Ambulatory Visit (INDEPENDENT_AMBULATORY_CARE_PROVIDER_SITE_OTHER): Payer: Medicare Other

## 2016-01-03 DIAGNOSIS — Q2733 Arteriovenous malformation of digestive system vessel: Secondary | ICD-10-CM

## 2016-01-03 DIAGNOSIS — D472 Monoclonal gammopathy: Secondary | ICD-10-CM | POA: Diagnosis not present

## 2016-01-03 DIAGNOSIS — D509 Iron deficiency anemia, unspecified: Secondary | ICD-10-CM

## 2016-01-04 ENCOUNTER — Other Ambulatory Visit: Payer: Self-pay | Admitting: Oncology

## 2016-01-04 DIAGNOSIS — K31811 Angiodysplasia of stomach and duodenum with bleeding: Secondary | ICD-10-CM

## 2016-01-04 DIAGNOSIS — D509 Iron deficiency anemia, unspecified: Secondary | ICD-10-CM

## 2016-01-05 ENCOUNTER — Telehealth: Payer: Self-pay | Admitting: *Deleted

## 2016-01-05 LAB — CBC WITH DIFFERENTIAL/PLATELET
Basophils Absolute: 0 10*3/uL (ref 0.0–0.2)
Basos: 1 %
EOS (ABSOLUTE): 0.2 10*3/uL (ref 0.0–0.4)
EOS: 5 %
HEMATOCRIT: 30.5 % — AB (ref 37.5–51.0)
HEMOGLOBIN: 8.6 g/dL — AB (ref 12.6–17.7)
Immature Grans (Abs): 0 10*3/uL (ref 0.0–0.1)
Immature Granulocytes: 0 %
LYMPHS ABS: 1.5 10*3/uL (ref 0.7–3.1)
Lymphs: 33 %
MCH: 18.4 pg — AB (ref 26.6–33.0)
MCHC: 28.2 g/dL — AB (ref 31.5–35.7)
MCV: 65 fL — AB (ref 79–97)
MONOCYTES: 14 %
MONOS ABS: 0.7 10*3/uL (ref 0.1–0.9)
NEUTROS ABS: 2.2 10*3/uL (ref 1.4–7.0)
Neutrophils: 47 %
Platelets: 271 10*3/uL (ref 150–379)
RBC: 4.67 x10E6/uL (ref 4.14–5.80)
RDW: 19.4 % — AB (ref 12.3–15.4)
WBC: 4.6 10*3/uL (ref 3.4–10.8)

## 2016-01-05 LAB — KAPPA/LAMBDA LIGHT CHAINS
IG KAPPA FREE LIGHT CHAIN: 30.6 mg/L — AB (ref 3.30–19.40)
Ig Lambda Free Light Chain: 61 mg/L — ABNORMAL HIGH (ref 5.71–26.30)
Kappa/Lambda FluidC Ratio: 0.5 (ref 0.26–1.65)

## 2016-01-05 LAB — IMMUNOFIXATION ELECTROPHORESIS
IGM (IMMUNOGLOBULIN M), SRM: 66 mg/dL (ref 15–143)
IgA/Immunoglobulin A, Serum: 362 mg/dL (ref 61–437)
IgG (Immunoglobin G), Serum: 1462 mg/dL (ref 700–1600)
Total Protein: 7.5 g/dL (ref 6.0–8.5)

## 2016-01-05 LAB — FERRITIN: Ferritin: 20 ng/mL — ABNORMAL LOW (ref 30–400)

## 2016-01-05 NOTE — Telephone Encounter (Signed)
-----   Message from Annia Belt, MD sent at 01/04/2016  4:52 PM EDT ----- Call pt: low on iron again. Please schedule feraheme weekly x 2 with [facility] short stay & coordinate with patient

## 2016-01-05 NOTE — Telephone Encounter (Signed)
Pt called / informed Iron is low again - Ferritin level 20 and needs IV iron infusion. Has appt to see Dr Darnell Level on Tues 4/25 @ 0915 AM then will go to Short Stay here for IV iron @ 12noon. Pt agreeable.

## 2016-01-10 ENCOUNTER — Encounter: Payer: Self-pay | Admitting: Oncology

## 2016-01-10 ENCOUNTER — Other Ambulatory Visit: Payer: Self-pay | Admitting: Oncology

## 2016-01-10 ENCOUNTER — Ambulatory Visit (HOSPITAL_COMMUNITY): Admission: RE | Admit: 2016-01-10 | Payer: Medicare Other | Source: Ambulatory Visit

## 2016-01-10 ENCOUNTER — Ambulatory Visit (HOSPITAL_COMMUNITY)
Admission: RE | Admit: 2016-01-10 | Discharge: 2016-01-10 | Disposition: A | Discharge status: Home / Self Care | Payer: Medicare Other | Source: Ambulatory Visit | Attending: Oncology | Admitting: Oncology

## 2016-01-10 ENCOUNTER — Ambulatory Visit (INDEPENDENT_AMBULATORY_CARE_PROVIDER_SITE_OTHER): Payer: Medicare Other | Admitting: Oncology

## 2016-01-10 VITALS — BP 130/65 | HR 88 | Temp 97.8°F | Ht 74.0 in | Wt 218.4 lb

## 2016-01-10 DIAGNOSIS — D509 Iron deficiency anemia, unspecified: Secondary | ICD-10-CM

## 2016-01-10 DIAGNOSIS — J929 Pleural plaque without asbestos: Secondary | ICD-10-CM

## 2016-01-10 DIAGNOSIS — R911 Solitary pulmonary nodule: Secondary | ICD-10-CM

## 2016-01-10 DIAGNOSIS — Q2733 Arteriovenous malformation of digestive system vessel: Secondary | ICD-10-CM

## 2016-01-10 DIAGNOSIS — D472 Monoclonal gammopathy: Secondary | ICD-10-CM | POA: Diagnosis not present

## 2016-01-10 HISTORY — DX: Monoclonal gammopathy: D47.2

## 2016-01-10 NOTE — Progress Notes (Signed)
Patient ID: [name], male   DOB: Feb 04, 1941, 75 y.o.   MRN: [id number] Hematology and Oncology Follow Up Visit  [name] [id number] [birth year] 74 y.o. 01/10/2016 9:37 AM   Principle Diagnosis: Encounter Diagnoses  Name Primary?  . Iron deficiency anemia Yes  . ARTERIOVENOUS MALFORMATION, COLON   Clinical Summary: 75 year old man initially referred to me for evaluation of iron deficiency anemia. 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 Please see my office summary note dated 04/07/2013 for complete details of his previous anemia evaluation. He has required periodic iron infusions.  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 He was also found to have positive sickle trait. No evidence for any hemolytic process.  At the time of my examination on 04/07/2013, I felt an approximate 3 cm node in his right axilla. No other areas of adenopathy. I obtained a CT scan of the chest abdomen and pelvis done on July 31. The node that I was able to palpate clinically was not visible on the CT scan. There were no other significant areas of adenopathy or organomegaly. He had a few tiny subcentimeter nodules in his lungs and some calcified pleural plaquing. He denies any asbestos exposure. He stopped smoking over 20 years ago.  I referred him for a lymph node biopsy done by Dr. Erroll Luna., September 01, 2013 which showed benign lymphadenitis. No evidence for lymphoma. Serial CT scans of his chest have not shown any growth of the small, 5 mm, pulmonary nodules. Or change in the pleural plaques.  Interim History:  Overall doing well. Lab done in anticipation of today's visit shows his hemoglobin is drifting down again. 10.4 on January 10. 8.6 with MCV 65 on April 18. He continues to take Niferex 150 mg twice  daily. He has not noted any hematochezia. He does not notice that his stools are dark on the iron either. He does admit to increasing fatigue and dyspnea on exertion. He denies any chest pain or palpitations. No dysuria or hematuria.  Medications: reviewed  Allergies: No Known Allergies  Review of Systems: See Interim History  Remaining ROS negative:   Physical Exam: Blood pressure 130/65, pulse 88, temperature 97.8 F (36.6 C), temperature source Oral, height 6\' 2"  (1.88 m), weight 218 lb 6.4 oz (99.066 kg), SpO2 98 %. Wt Readings from Last 3 Encounters:  01/10/16 218 lb 6.4 oz (99.066 kg)  08/01/15 216 lb (97.977 kg)  07/19/15 219 lb 14.4 oz (99.746 kg)     General appearance: Tall, well-nourished, African-American man HENNT: Pharynx no erythema, exudate, mass, or ulcer. No thyromegaly or thyroid nodules Lymph nodes: No cervical, supraclavicular, or axillary lymphadenopathy Breasts:  Lungs: Clear to auscultation, resonant to percussion throughout Heart: Regular rhythm, no murmur, no gallop, no rub, no click, no edema Abdomen: Soft, nontender, normal bowel sounds, no mass, no organomegaly Extremities: No edema, no calf tenderness Musculoskeletal: no joint deformities GU:  Vascular: Carotid pulses 2+, no bruits, Neurologic: Alert, oriented, PERRLA,  , cranial nerves grossly normal, motor strength 5 over 5, reflexes 1+ symmetric, upper body coordination normal, gait normal, Skin: No rash or ecchymosis  Lab Results: CBC W/Diff    Component Value Date/Time   WBC 4.6 01/03/2016 0959   WBC 4.3 01/11/2015 0938   WBC 4.0 03/03/2014 0926   WBC 6.2 01/03/2013 1104  RBC 4.67 01/03/2016 0959   RBC 5.08 01/11/2015 0938   RBC 5.12 03/03/2014 0926   RBC 4.84 01/03/2013 1104   HGB 12.4* 01/11/2015 0938   HGB 11.2* 03/03/2014 0926   HGB 12.8* 01/03/2013 1104   HCT 30.5* 01/03/2016 0959   HCT 39.2 01/11/2015 0938   HCT 35.8* 03/03/2014 0926   HCT 41.4* 01/03/2013 1104   PLT 271  01/03/2016 0959   PLT 206 01/11/2015 0938   PLT 206 03/03/2014 0926   MCV 65* 01/03/2016 0959   MCV 77.2* 01/11/2015 0938   MCV 69.9* 03/03/2014 0926   MCV 85.5 01/03/2013 1104   MCH 18.4* 01/03/2016 0959   MCH 24.4* 01/11/2015 0938   MCH 21.9* 03/03/2014 0926   MCH 26.4* 01/03/2013 1104   MCHC 28.2* 01/03/2016 0959   MCHC 31.6 01/11/2015 0938   MCHC 31.3* 03/03/2014 0926   MCHC 30.9* 01/03/2013 1104   RDW 19.4* 01/03/2016 0959   RDW 17.4* 01/11/2015 0938   RDW 18.1* 03/03/2014 0926   LYMPHSABS 1.5 01/03/2016 0959   LYMPHSABS 1.3 01/11/2015 0938   LYMPHSABS 1.0 03/03/2014 0926   MONOABS 0.8 01/11/2015 0938   MONOABS 0.6 03/03/2014 0926   EOSABS 0.2 01/03/2016 0959   EOSABS 0.1 01/11/2015 0938   EOSABS 0.1 03/03/2014 0926   BASOSABS 0.0 01/03/2016 0959   BASOSABS 0.0 01/11/2015 0938   BASOSABS 0.0 03/03/2014 0926     Chemistry      Component Value Date/Time   NA 140 07/08/2015 1004   NA 140 01/11/2015 0938   NA 141 03/03/2014 0926   K 4.2 07/08/2015 1004   K 4.0 03/03/2014 0926   CL 102 07/08/2015 1004   CL 108* 09/26/2012 0928   CO2 20 07/08/2015 1004   CO2 23 03/03/2014 0926   BUN 9 07/08/2015 1004   BUN 14 01/11/2015 0938   BUN 9.0 03/03/2014 0926   CREATININE 1.21 07/08/2015 1004   CREATININE 1.13 01/11/2015 0938   CREATININE 1.1 03/03/2014 0926      Component Value Date/Time   CALCIUM 8.7 07/08/2015 1004   CALCIUM 9.0 03/03/2014 0926   ALKPHOS 52 07/08/2015 1004   ALKPHOS 60 03/03/2014 0926   AST 24 07/08/2015 1004   AST 21 03/03/2014 0926   ALT 22 07/08/2015 1004   ALT 20 03/03/2014 0926   BILITOT 0.4 07/08/2015 1004   BILITOT 0.9 01/11/2015 0938   BILITOT 0.63 03/03/2014 0926       Radiological Studies: No results found.  Impression:  #1. 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 He is not getting any results from oral iron replacement. I will go ahead and schedule another dose of IV iron 510 mg  weekly 2. Continue to monitor lab every 3 months.   #2. IgG lambda monoclonal gammopathy undetermined significance Studies done  on 07/08/2015 now showed normalization of total IgG and disappearance of the monoclonal protein on immunofixation electrophoresis. Serum free light chain ratio remains in the normal range. This has now been reproducible 2 on repeat lab testing done on 01/03/2016. He has some mild, chronic, renal insufficiency which explains the elevation of both kappa and lambda free light chains but the fact that the ratio is normal supports this is due to medical renal disease and not light chain myeloma.  #3. Stable subcentimeter pulmonary nodules and calcified pleural plaques See discussion above. Most recent CTMay 2016 study remained stable. I will get a routine chest x-ray today.  #4.  Status post excision of a benign reactive left axillary lymph node  #5. 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 Essential hypertension  #7. Sickle cell trait  #8. 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  Addendum: Plain chest film does not show the subcentimeter nodule seen on prior CT scans. Radiologist recommends another follow-up CT scan. I am not sure that this is necessary.  CC: Patient Care Team: Haywood Pao, MD as PCP - General (Internal Medicine)   Annia Belt, MD 4/25/20179:37 AM

## 2016-01-10 NOTE — Patient Instructions (Signed)
Chest X-ray today Reschedule IV iron 1st available CBC, ferritin lab every 3 months MD visit 1 year, lab 1-2 weeks before visit

## 2016-01-11 ENCOUNTER — Telehealth: Payer: Self-pay | Admitting: *Deleted

## 2016-01-11 NOTE — Telephone Encounter (Signed)
-----   Message from Annia Belt, MD sent at 01/10/2016  3:50 PM EDT ----- Call pt: chest X-ray looks good but Radiologist wants me to get a CT scan because it is more sensitive. If CT is unchaned from before, we don't need to keep getting them. I will put in order. Please schedule

## 2016-01-11 NOTE — Telephone Encounter (Signed)
Pt called / informed CXR looks good but Radiologist wants to get a CT scan b/c it's "more sensitive"; if CT is unchanged,there will be no need in continue getting them per Dr Beryle Beams. Also need to get labs Prior to CT - stated he will have this done on 5/2 after IV iron infusion. CT scan has been scheduled on May 4,2017.

## 2016-01-16 ENCOUNTER — Other Ambulatory Visit (HOSPITAL_COMMUNITY): Payer: Self-pay | Admitting: *Deleted

## 2016-01-17 ENCOUNTER — Encounter (HOSPITAL_COMMUNITY)
Admission: RE | Admit: 2016-01-17 | Discharge: 2016-01-17 | Disposition: A | Discharge status: Home / Self Care | Payer: Medicare Other | Source: Ambulatory Visit | Attending: Oncology | Admitting: Oncology

## 2016-01-17 VITALS — BP 118/57 | HR 71 | Temp 97.6°F | Resp 20 | Ht 74.0 in | Wt 210.0 lb

## 2016-01-17 DIAGNOSIS — D509 Iron deficiency anemia, unspecified: Secondary | ICD-10-CM | POA: Diagnosis present

## 2016-01-17 DIAGNOSIS — K31811 Angiodysplasia of stomach and duodenum with bleeding: Secondary | ICD-10-CM | POA: Diagnosis not present

## 2016-01-17 MED ORDER — SODIUM CHLORIDE 0.9 % IV SOLN
510.0000 mg | INTRAVENOUS | Status: DC
  Administered 2016-01-17: 510 mg via INTRAVENOUS
  Filled 2016-01-17: qty 17

## 2016-01-19 ENCOUNTER — Ambulatory Visit (HOSPITAL_COMMUNITY)
Admission: RE | Admit: 2016-01-19 | Discharge: 2016-01-19 | Disposition: A | Discharge status: Home / Self Care | Payer: Medicare Other | Source: Ambulatory Visit | Attending: Oncology | Admitting: Oncology

## 2016-01-19 DIAGNOSIS — J479 Bronchiectasis, uncomplicated: Secondary | ICD-10-CM | POA: Diagnosis not present

## 2016-01-19 DIAGNOSIS — R911 Solitary pulmonary nodule: Secondary | ICD-10-CM

## 2016-01-19 DIAGNOSIS — J439 Emphysema, unspecified: Secondary | ICD-10-CM | POA: Insufficient documentation

## 2016-01-19 DIAGNOSIS — J984 Other disorders of lung: Secondary | ICD-10-CM | POA: Insufficient documentation

## 2016-01-19 LAB — POCT I-STAT CREATININE: CREATININE: 1 mg/dL (ref 0.61–1.24)

## 2016-01-19 MED ORDER — IOPAMIDOL (ISOVUE-300) INJECTION 61%
INTRAVENOUS | Status: AC
  Administered 2016-01-19: 75 mL
  Filled 2016-01-19: qty 75

## 2016-01-23 ENCOUNTER — Telehealth: Payer: Self-pay | Admitting: *Deleted

## 2016-01-23 NOTE — Telephone Encounter (Signed)
-----   Message from Annia Belt, MD sent at 01/20/2016  1:11 PM EDT ----- Call pt CT Scan good no areas of concern

## 2016-01-23 NOTE — Telephone Encounter (Signed)
Pt called / Informed "CT Scan good no areas of concern" per Dr Beryle Beams. Pt had no questions.

## 2016-01-25 ENCOUNTER — Ambulatory Visit (HOSPITAL_COMMUNITY)
Admission: RE | Admit: 2016-01-25 | Discharge: 2016-01-25 | Disposition: A | Discharge status: Home / Self Care | Payer: Medicare Other | Source: Ambulatory Visit | Attending: Oncology | Admitting: Oncology

## 2016-01-25 VITALS — BP 142/66 | HR 73 | Resp 20

## 2016-01-25 DIAGNOSIS — K31811 Angiodysplasia of stomach and duodenum with bleeding: Secondary | ICD-10-CM | POA: Diagnosis present

## 2016-01-25 DIAGNOSIS — D509 Iron deficiency anemia, unspecified: Secondary | ICD-10-CM

## 2016-01-25 MED ORDER — SODIUM CHLORIDE 0.9 % IV SOLN
510.0000 mg | INTRAVENOUS | Status: AC
  Administered 2016-01-25: 510 mg via INTRAVENOUS
  Filled 2016-01-25: qty 17

## 2016-04-11 ENCOUNTER — Other Ambulatory Visit (INDEPENDENT_AMBULATORY_CARE_PROVIDER_SITE_OTHER): Payer: Medicare Other

## 2016-04-11 DIAGNOSIS — D509 Iron deficiency anemia, unspecified: Secondary | ICD-10-CM

## 2016-04-11 DIAGNOSIS — Q2733 Arteriovenous malformation of digestive system vessel: Secondary | ICD-10-CM

## 2016-04-12 LAB — CBC WITH DIFFERENTIAL/PLATELET
BASOS ABS: 0 10*3/uL (ref 0.0–0.2)
BASOS: 1 %
EOS (ABSOLUTE): 0.2 10*3/uL (ref 0.0–0.4)
Eos: 5 %
HEMOGLOBIN: 13.6 g/dL (ref 12.6–17.7)
Hematocrit: 41.8 % (ref 37.5–51.0)
IMMATURE GRANS (ABS): 0 10*3/uL (ref 0.0–0.1)
Immature Granulocytes: 0 %
LYMPHS ABS: 1.4 10*3/uL (ref 0.7–3.1)
LYMPHS: 32 %
MCH: 26.9 pg (ref 26.6–33.0)
MCHC: 32.5 g/dL (ref 31.5–35.7)
MCV: 83 fL (ref 79–97)
MONOCYTES: 13 %
Monocytes Absolute: 0.6 10*3/uL (ref 0.1–0.9)
NEUTROS ABS: 2.1 10*3/uL (ref 1.4–7.0)
Neutrophils: 49 %
Platelets: 156 10*3/uL (ref 150–379)
RBC: 5.06 x10E6/uL (ref 4.14–5.80)
RDW: 20.9 % — ABNORMAL HIGH (ref 12.3–15.4)
WBC: 4.3 10*3/uL (ref 3.4–10.8)

## 2016-04-12 LAB — FERRITIN: Ferritin: 48 ng/mL (ref 30–400)

## 2016-04-16 ENCOUNTER — Telehealth: Payer: Self-pay | Admitting: *Deleted

## 2016-04-16 NOTE — Telephone Encounter (Signed)
Pt called - not at home; talked to his wife. Informed pt's count back to normal after his IV iron infusion in May per Dr Beryle Beams. Stated she will relay this "great news" to her husband.

## 2016-04-16 NOTE — Telephone Encounter (Signed)
-----   Message from Annia Belt, MD sent at 04/12/2016  9:29 AM EDT ----- Call pt: blood count back to normal after getting IV iron in May

## 2016-04-26 ENCOUNTER — Encounter: Payer: Self-pay | Admitting: Gastroenterology

## 2016-07-11 ENCOUNTER — Other Ambulatory Visit (INDEPENDENT_AMBULATORY_CARE_PROVIDER_SITE_OTHER): Payer: Medicare Other

## 2016-07-11 DIAGNOSIS — Q2733 Arteriovenous malformation of digestive system vessel: Secondary | ICD-10-CM | POA: Diagnosis not present

## 2016-07-11 DIAGNOSIS — D509 Iron deficiency anemia, unspecified: Secondary | ICD-10-CM

## 2016-07-12 ENCOUNTER — Telehealth: Payer: Self-pay | Admitting: *Deleted

## 2016-07-12 LAB — CBC WITH DIFFERENTIAL/PLATELET
BASOS ABS: 0 10*3/uL (ref 0.0–0.2)
Basos: 1 %
EOS (ABSOLUTE): 0.2 10*3/uL (ref 0.0–0.4)
Eos: 3 %
Hematocrit: 42.1 % (ref 37.5–51.0)
Hemoglobin: 14.4 g/dL (ref 12.6–17.7)
IMMATURE GRANS (ABS): 0 10*3/uL (ref 0.0–0.1)
Immature Granulocytes: 0 %
LYMPHS: 28 %
Lymphocytes Absolute: 1.6 10*3/uL (ref 0.7–3.1)
MCH: 29.1 pg (ref 26.6–33.0)
MCHC: 34.2 g/dL (ref 31.5–35.7)
MCV: 85 fL (ref 79–97)
Monocytes Absolute: 1 10*3/uL — ABNORMAL HIGH (ref 0.1–0.9)
Monocytes: 18 %
NEUTROS ABS: 2.9 10*3/uL (ref 1.4–7.0)
NEUTROS PCT: 50 %
PLATELETS: 166 10*3/uL (ref 150–379)
RBC: 4.94 x10E6/uL (ref 4.14–5.80)
RDW: 15.1 % (ref 12.3–15.4)
WBC: 5.8 10*3/uL (ref 3.4–10.8)

## 2016-07-12 LAB — FERRITIN: FERRITIN: 73 ng/mL (ref 30–400)

## 2016-07-12 NOTE — Telephone Encounter (Signed)
-----   Message from Annia Belt, MD sent at 07/12/2016  9:54 AM EDT ----- Call pt: blood count back to normal

## 2016-07-12 NOTE — Telephone Encounter (Signed)
Pt called / informed  "blood count back to normal " per Dr Beryle Beams. Pt stated "very good".

## 2016-07-17 ENCOUNTER — Ambulatory Visit: Payer: Medicare Other | Admitting: Gastroenterology

## 2017-05-06 ENCOUNTER — Ambulatory Visit (INDEPENDENT_AMBULATORY_CARE_PROVIDER_SITE_OTHER): Payer: Medicare Other

## 2017-05-06 ENCOUNTER — Encounter (INDEPENDENT_AMBULATORY_CARE_PROVIDER_SITE_OTHER): Payer: Self-pay | Admitting: Orthopaedic Surgery

## 2017-05-06 ENCOUNTER — Ambulatory Visit (INDEPENDENT_AMBULATORY_CARE_PROVIDER_SITE_OTHER): Payer: Self-pay | Admitting: Orthopaedic Surgery

## 2017-05-06 DIAGNOSIS — M25512 Pain in left shoulder: Secondary | ICD-10-CM | POA: Diagnosis not present

## 2017-05-06 MED ORDER — NAPROXEN 500 MG PO TABS: 500.0000 mg | ORAL_TABLET | Freq: Two times a day (BID) | ORAL | 3 refills | Status: DC

## 2017-05-06 NOTE — Addendum Note (Signed)
Addended by: Precious Bard on: 05/06/2017 04:55 PM   Modules accepted: Orders

## 2017-05-06 NOTE — Progress Notes (Signed)
Office Visit Note   Patient: [name]           Date of Birth: [birth year]           MRN: [id number] Visit Date: 05/06/2017              Requested by: Haywood Pao, MD [address] [place], [place] [address] PCP: Osborne Casco Fransico Him, MD   Assessment & Plan: Visit Diagnoses:  1. Acute pain of left shoulder     Plan: MRI of the left shoulder to rule out traumatic left rotator cuff tear given significant weakness on exam. Follow-up after the MRI. Naproxen prescribed today.  Follow-Up Instructions: Return in about 10 days (around 05/16/2017).   Orders:  Orders Placed This Encounter  Procedures  . XR Shoulder Left   Meds ordered this encounter  Medications  . naproxen (NAPROSYN) 500 MG tablet    Sig: Take 1 tablet (500 mg total) by mouth 2 (two) times daily with a meal.    Dispense:  30 tablet    Refill:  3      Procedures: No procedures performed   Clinical Data: No additional findings.   Subjective: Chief Complaint  Patient presents with  . Left Shoulder - Pain    Patient is a 76 year old gentleman with left shoulder pain acutely from a fall about 2 weeks ago. He continues to have left shoulder pain and functional disability. Denies any numbness and tingling. Denies any neck pain.    Review of Systems  Constitutional: Negative.   All other systems reviewed and are negative.    Objective: Vital Signs: There were no vitals taken for this visit.  Physical Exam  Constitutional: He is oriented to person, place, and time. He appears well-developed and well-nourished.  HENT:  Head: Normocephalic and atraumatic.  Eyes: Pupils are equal, round, and reactive to light.  Neck: Neck supple.  Pulmonary/Chest: Effort normal.  Abdominal: Soft.  Musculoskeletal: Normal range of motion.  Neurological: He is alert and oriented to person, place, and time.  Skin: Skin is warm.  Psychiatric: He has a normal mood and affect. His behavior is normal. Judgment and  thought content normal.  Nursing note and vitals reviewed.   Ortho Exam  Left shoulder exam shows mildly positive impingement signs. Positive empty can and infraspinatus weakness.Marland Kitchen Specialty Comments:  No specialty comments available.  Imaging: No results found.   PMFS History: Patient Active Problem List   Diagnosis Date Noted  . MGUS (monoclonal gammopathy of unknown significance) 01/10/2016  . Avascular necrosis of bone of left hip ([place]) 12/25/2013  . 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 History of colonic polyps 02/17/2009  . 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 Iron deficiency anemia 10/25/2008   Past Medical History:  Diagnosis Date  . Abnormal CT of the chest 04/16/13   . Anemia    sees dr Griffin Basil OF IRON DEFICIENCY ANEMIA FROM INTERMITTENT GI BLOOD LOSS RELATED TO SMALL BOWEL AVMS  . Avascular necrosis ([place])    LEFT HIP - PT STATES HE WALKS WITH LIMP AND PAIN AT TIMES  . AVM (arteriovenous malformation) of colon    HX OF GI BLOOD LOSS RELATED TO SMALL BOWEL AVM'S  . GERD (gastroesophageal reflux disease)   . Gout   . Hyperlipemia   . Hypertension   . IgG lambda monoclonal gammopathy    "OF UNDETERMINED SIGNIFICANCE" - FOLLOWED BY DR. Beryle Beams  . Lumbar pain    HX O PAIN LEFT  LEG AND XRAYS BACK IN 2005 OR 2006 AND TOLD DISC RUBBING ON NERVE - GIVEN INJECTIONS LOWER BACK - DIDN'T SOLVE THE PAIN DOWN LEFT LEG  . Lymphadenopathy, axillary 04/07/2013   3 cm right axillary node S/P BIOPSY - NOT MALIGNANT  . MGUS (monoclonal gammopathy of unknown significance) 01/10/2016  . Sickle cell trait ([place])   . Wears glasses   . Wears partial dentures    top and bottom partials    Family History  Problem Relation Age of Onset  . Colon cancer Neg Hx     Past Surgical History:  Procedure Laterality Date  . APPENDECTOMY    . COLONOSCOPY    . ENTEROSCOPY  08/24/2011   Procedure: ENTEROSCOPY;  Surgeon: Inda Castle, MD;  Location: [facility];  Service: Endoscopy;  Laterality: N/A;  . HERNIA REPAIR     rt ing  . MASS EXCISION Right 09/01/2013   Procedure: EXCISION AXILLA  MASS;  Surgeon: Joyice Faster. Cornett, MD;  Location: [facility];  Service: General;  Laterality: Right;  . TOTAL HIP ARTHROPLASTY Left 12/25/2013   Procedure: LEFT TOTAL HIP ARTHROPLASTY ANTERIOR APPROACH;  Surgeon: Mcarthur Rossetti, MD;  Location: [facility];  Service: Orthopedics;  Laterality: Left;   Social History   Occupational History  . Retired    .  Retired   Social History Main Topics  . Smoking status: Former Smoker    Quit date: 08/27/1992  . Smokeless tobacco: Never Used  . Alcohol use 0.0 oz/week     Comment: Gin and beer every other day  . Drug use: No  . Sexual activity: Not on file

## 2017-05-16 ENCOUNTER — Ambulatory Visit (INDEPENDENT_AMBULATORY_CARE_PROVIDER_SITE_OTHER): Payer: Medicare Other | Admitting: Orthopaedic Surgery

## 2017-05-16 ENCOUNTER — Ambulatory Visit
Admission: RE | Admit: 2017-05-16 | Discharge: 2017-05-16 | Disposition: A | Discharge status: Home / Self Care | Payer: Medicare Other | Source: Ambulatory Visit | Attending: Orthopaedic Surgery | Admitting: Orthopaedic Surgery

## 2017-05-16 ENCOUNTER — Encounter (INDEPENDENT_AMBULATORY_CARE_PROVIDER_SITE_OTHER): Payer: Self-pay | Admitting: Orthopaedic Surgery

## 2017-05-16 DIAGNOSIS — M75102 Unspecified rotator cuff tear or rupture of left shoulder, not specified as traumatic: Secondary | ICD-10-CM

## 2017-05-16 DIAGNOSIS — M25512 Pain in left shoulder: Secondary | ICD-10-CM

## 2017-05-16 NOTE — Progress Notes (Signed)
Office Visit Note   Patient: [name]           Date of Birth: January 01, 1941           MRN: [id number] Visit Date: 05/16/2017              Requested by: Haywood Pao, MD [address] [place], [place] [address] PCP: Osborne Casco Fransico Him, MD   Assessment & Plan: Visit Diagnoses:  1. Rotator cuff syndrome of left shoulder     Plan: MRI shows a large full-thickness retracted tear of the supraspinatus and a near full-thickness tear of his infraspinatus. However patient's function is very well compensated and he is not in significant pain. At his age I think is very reasonable to try to rehabilitation this through physical therapy and give it some more time. This was discussed with the patient who is in agreement. Follow-up in 6 weeks for recheck.  Follow-Up Instructions: Return in about 6 weeks (around 06/27/2017).   Orders:  No orders of the defined types were placed in this encounter.  No orders of the defined types were placed in this encounter.     Procedures: No procedures performed   Clinical Data: No additional findings.   Subjective: Chief Complaint  Patient presents with  . Left Shoulder - Pain, Follow-up    Patient follows up today for his MRI of his left shoulder. His shoulder is slightly better. Denies any numbness and tingling.    Review of Systems  Constitutional: Negative.   All other systems reviewed and are negative.    Objective: Vital Signs: There were no vitals taken for this visit.  Physical Exam  Constitutional: He is oriented to person, place, and time. He appears well-developed and well-nourished.  Pulmonary/Chest: Effort normal.  Abdominal: Soft.  Neurological: He is alert and oriented to person, place, and time.  Skin: Skin is warm.  Psychiatric: He has a normal mood and affect. His behavior is normal. Judgment and thought content normal.  Nursing note and vitals reviewed.   Ortho Exam Left shoulder exam shows 4 out of 5  strength with supraspinatus and infraspinatus. 5 out of 5 subscapularis. Passive range of motion is normal. Active range of motion slightly reduced secondary to weakness and pain. Overall well compensated function. Specialty Comments:  No specialty comments available.  Imaging: Mr Shoulder Left W/o Contrast  Result Date: 05/16/2017 CLINICAL DATA:  Lateral and anterior shoulder pain after a fall. EXAM: MRI OF THE LEFT SHOULDER WITHOUT CONTRAST TECHNIQUE: Multiplanar, multisequence MR imaging of the shoulder was performed. No intravenous contrast was administered. COMPARISON:  Left shoulder x-rays dated May 14, 2017. FINDINGS: Rotator cuff: There is a full-thickness, near full width tear of the supraspinatus tendon. Some distal anterior fibers likely remain intact. There is thickening of the infraspinatus tendon with a full-thickness, near full width tear of the distal tendon. Portions of the infraspinatus tendon appear retracted to the superior humeral head. The tear extends along the proximal tendon to the myotendinous junction. There is increased intermediate signal within the subscapularis tendon with areas of low-grade undersurface tearing. The teres minor tendon is intact. Muscles: There is small amount of edema in the infraspinatus muscle near the myotendinous junction. Mild fatty atrophy of the infraspinatus muscle. Biceps long head: Tendinosis of the intra-articular portion with fluid in the biceps tendon sheath. There is a 4 mm T2 hyperintense, T1 isointense lesion within the biceps tendon sheath, likely a loose body. Acromioclavicular Joint: Moderate arthropathy of the acromioclavicular  joint. Type I acromion. Small amount of fluid and debris in the subacromial/subdeltoid bursa. Glenohumeral Joint: Mild diffuse cartilage thinning without focal full-thickness defect. Small joint effusion. Labrum: Near circumferential degenerative tearing of the labrum, sparing the inferior labrum. Bones: There is a  small impaction fracture of posterior humeral head. Other: Small amount of fluid and loose bodies in the subcoracoid bursa. IMPRESSION: 1. Full-thickness, near full width tears of the supraspinatus and infraspinatus tendons with prominent hemorrhage and edema at the humeral attachment sites. There is retraction of the infraspinatus tendon to the superior humeral head. 2. Mild subscapularis tendinosis with low-grade undersurface tearing. 3. Intra-articular biceps tendinosis. 4. Small impaction fracture of the posterior humeral head. 5. Near circumferential degenerative tearing of the labrum, sparing the inferior labrum. 6. Loose bodies in the subcoracoid bursa and biceps tendon sheath. Electronically Signed   By: Titus Dubin M.D.   On: 05/16/2017 09:28     PMFS History: Patient Active Problem List   Diagnosis Date Noted  . Rotator cuff syndrome of left shoulder 05/16/2017  . MGUS (monoclonal gammopathy of unknown significance) 01/10/2016  . Avascular necrosis of bone of left hip ([place]) 12/25/2013  . 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 History of colonic polyps 02/17/2009  . 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 Iron deficiency anemia 10/25/2008   Past Medical History:  Diagnosis Date  . Abnormal CT of the chest 04/16/13   . Anemia    sees dr Griffin Basil OF IRON DEFICIENCY ANEMIA FROM INTERMITTENT GI BLOOD LOSS RELATED TO SMALL BOWEL AVMS  . Avascular necrosis ([place])    LEFT HIP - PT STATES HE WALKS WITH LIMP AND PAIN AT TIMES  . AVM (arteriovenous malformation) of colon    HX OF GI BLOOD LOSS RELATED TO SMALL BOWEL AVM'S  . GERD (gastroesophageal reflux disease)   . Gout   . Hyperlipemia   . Hypertension   . IgG lambda monoclonal gammopathy    "OF UNDETERMINED SIGNIFICANCE" - FOLLOWED BY DR. Beryle Beams  . Lumbar pain    HX O PAIN LEFT LEG AND XRAYS BACK IN 2005 OR 2006 AND TOLD DISC RUBBING ON NERVE - GIVEN INJECTIONS LOWER BACK - DIDN'T SOLVE THE PAIN  DOWN LEFT LEG  . Lymphadenopathy, axillary 04/07/2013   3 cm right axillary node S/P BIOPSY - NOT MALIGNANT  . MGUS (monoclonal gammopathy of unknown significance) 01/10/2016  . Sickle cell trait ([place])   . Wears glasses   . Wears partial dentures    top and bottom partials    Family History  Problem Relation Age of Onset  . Colon cancer Neg Hx     Past Surgical History:  Procedure Laterality Date  . APPENDECTOMY    . COLONOSCOPY    . ENTEROSCOPY  08/24/2011   Procedure: ENTEROSCOPY;  Surgeon: Inda Castle, MD;  Location: [facility];  Service: Endoscopy;  Laterality: N/A;  . HERNIA REPAIR     rt ing  . MASS EXCISION Right 09/01/2013   Procedure: EXCISION AXILLA  MASS;  Surgeon: Joyice Faster. Cornett, MD;  Location: [facility];  Service: General;  Laterality: Right;  . TOTAL HIP ARTHROPLASTY Left 12/25/2013   Procedure: LEFT TOTAL HIP ARTHROPLASTY ANTERIOR APPROACH;  Surgeon: Mcarthur Rossetti, MD;  [facility];  Service: Orthopedics;  Laterality: Left;   Social History   Occupational History  . Retired    .  Retired   Social History Main Topics  . Smoking status: Former Smoker  Quit date: 08/27/1992  . Smokeless tobacco: Never Used  . Alcohol use 0.0 oz/week     Comment: Gin and beer every other day  . Drug use: No  . Sexual activity: Not on file

## 2017-06-13 ENCOUNTER — Encounter (INDEPENDENT_AMBULATORY_CARE_PROVIDER_SITE_OTHER): Payer: Self-pay | Admitting: Orthopaedic Surgery

## 2017-06-13 ENCOUNTER — Ambulatory Visit (INDEPENDENT_AMBULATORY_CARE_PROVIDER_SITE_OTHER): Payer: Medicare Other

## 2017-06-13 ENCOUNTER — Ambulatory Visit (INDEPENDENT_AMBULATORY_CARE_PROVIDER_SITE_OTHER): Payer: Medicare Other | Admitting: Orthopaedic Surgery

## 2017-06-13 DIAGNOSIS — M25551 Pain in right hip: Secondary | ICD-10-CM

## 2017-06-13 MED ORDER — PREDNISONE 10 MG (21) PO TBPK: ORAL_TABLET | ORAL | 0 refills | Status: DC

## 2017-06-13 NOTE — Progress Notes (Signed)
Office Visit Note   Patient: [name]           Date of Birth: 1941-05-21           MRN: [id number] Visit Date: 06/13/2017              Requested by: Haywood Pao, MD [address] [place], [place] [address] PCP: Haywood Pao, MD   Assessment & Plan: Visit Diagnoses:  1. Pain in right hip     Plan: Overall impression is right hip pain not classically hip or back presentation. I recommend prednisone taper and relative rest. Follow-up as needed. If not better we'll consider advanced imaging  Follow-Up Instructions: Return if symptoms worsen or fail to improve.   Orders:  Orders Placed This Encounter  Procedures  . XR HIP UNILAT W OR W/O PELVIS 2-3 VIEWS RIGHT   Meds ordered this encounter  Medications  . predniSONE (STERAPRED UNI-PAK 21 TAB) 10 MG (21) TBPK tablet    Sig: Take as directed    Dispense:  21 tablet    Refill:  0      Procedures: No procedures performed   Clinical Data: No additional findings.   Subjective: Chief Complaint  Patient presents with  . Right Hip - Pain    Patient comes in today for her right hip pain is worse with sitting on a large wallet. He denies any radicular symptoms. Denies any groin pain. Lateral hip is nontender. He has had incidences of his right knee giving out. Denies any numbness and tingling.    Review of Systems  Constitutional: Negative.   All other systems reviewed and are negative.    Objective: Vital Signs: There were no vitals taken for this visit.  Physical Exam  Constitutional: He is oriented to person, place, and time. He appears well-developed and well-nourished.  Pulmonary/Chest: Effort normal.  Abdominal: Soft.  Neurological: He is alert and oriented to person, place, and time.  Skin: Skin is warm.  Psychiatric: He has a normal mood and affect. His behavior is normal. Judgment and thought content normal.  Nursing note and vitals reviewed.   Ortho Exam Right hip exam shows  painless rotation of the hip. Negative Stinchfield sign negative straight leg raise. Negative Faber. Negative axial compression. Lateral hip is nontender. Specialty Comments:  No specialty comments available.  Imaging: Xr Hip Unilat W Or W/o Pelvis 2-3 Views Right  Result Date: 06/13/2017 No evidence of advanced degenerative joint disease appears stable left total hip replacement    PMFS History: Patient Active Problem List   Diagnosis Date Noted  . Rotator cuff syndrome of left shoulder 05/16/2017  . MGUS (monoclonal gammopathy of unknown significance) 01/10/2016  . Avascular necrosis of bone of left hip ([place]) 12/25/2013  . 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 History of colonic polyps 02/17/2009  . 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 Iron deficiency anemia 10/25/2008   Past Medical History:  Diagnosis Date  . Abnormal CT of the chest 04/16/13   . Anemia    sees dr Griffin Basil OF IRON DEFICIENCY ANEMIA FROM INTERMITTENT GI BLOOD LOSS RELATED TO SMALL BOWEL AVMS  . Avascular necrosis ([place])    LEFT HIP - PT STATES HE WALKS WITH LIMP AND PAIN AT TIMES  . AVM (arteriovenous malformation) of colon    HX OF GI BLOOD LOSS RELATED TO SMALL BOWEL AVM'S  . GERD (gastroesophageal reflux disease)   . Gout   . Hyperlipemia   .  Hypertension   . IgG lambda monoclonal gammopathy    "OF UNDETERMINED SIGNIFICANCE" - FOLLOWED BY DR. Beryle Beams  . Lumbar pain    HX O PAIN LEFT LEG AND XRAYS BACK IN 2005 OR 2006 AND TOLD DISC RUBBING ON NERVE - GIVEN INJECTIONS LOWER BACK - DIDN'T SOLVE THE PAIN DOWN LEFT LEG  . Lymphadenopathy, axillary 04/07/2013   3 cm right axillary node S/P BIOPSY - NOT MALIGNANT  . MGUS (monoclonal gammopathy of unknown significance) 01/10/2016  . Sickle cell trait ([place])   . Wears glasses   . Wears partial dentures    top and bottom partials    Family History  Problem Relation Age of Onset  . Colon cancer Neg Hx     Past Surgical  History:  Procedure Laterality Date  . APPENDECTOMY    . COLONOSCOPY    . ENTEROSCOPY  08/24/2011   Procedure: ENTEROSCOPY;  Surgeon: Inda Castle, MD;  Location: [facility];  Service: Endoscopy;  Laterality: N/A;  . HERNIA REPAIR     rt ing  . MASS EXCISION Right 09/01/2013   Procedure: EXCISION AXILLA  MASS;  Surgeon: Joyice Faster. Cornett, MD;  Location: [facility];  Service: General;  Laterality: Right;  . TOTAL HIP ARTHROPLASTY Left 12/25/2013   Procedure: LEFT TOTAL HIP ARTHROPLASTY ANTERIOR APPROACH;  Surgeon: Mcarthur Rossetti, MD;  Location: [facility];  Service: Orthopedics;  Laterality: Left;   Social History   Occupational History  . Retired    .  Retired   Social History Main Topics  . Smoking status: Former Smoker    Quit date: 08/27/1992  . Smokeless tobacco: Never Used  . Alcohol use 0.0 oz/week     Comment: Gin and beer every other day  . Drug use: No  . Sexual activity: Not on file

## 2017-06-27 ENCOUNTER — Ambulatory Visit (INDEPENDENT_AMBULATORY_CARE_PROVIDER_SITE_OTHER): Payer: Medicare Other | Admitting: Orthopaedic Surgery

## 2017-06-28 ENCOUNTER — Ambulatory Visit (INDEPENDENT_AMBULATORY_CARE_PROVIDER_SITE_OTHER): Payer: Medicare Other | Admitting: Orthopaedic Surgery

## 2017-06-28 ENCOUNTER — Encounter (INDEPENDENT_AMBULATORY_CARE_PROVIDER_SITE_OTHER): Payer: Self-pay | Admitting: Orthopaedic Surgery

## 2017-06-28 DIAGNOSIS — M25512 Pain in left shoulder: Secondary | ICD-10-CM

## 2017-06-28 NOTE — Progress Notes (Signed)
Office Visit Note   Patient: [name]           Date of Birth: Aug 25, 1941           MRN: [id number] Visit Date: 06/28/2017              Requested by: Haywood Pao, MD [address] [place], [place] [address] PCP: Osborne Casco Fransico Him, MD   Assessment & Plan: Visit Diagnoses:  1. Acute pain of left shoulder     Plan: Patient likely has a tear of his infraspinatus but overall is very well compensated and he is improving with physical therapy. I would recommend continue with home exercises. He does not have any significant pain. Follow-up as needed.  Follow-Up Instructions: Return if symptoms worsen or fail to improve.   Orders:  No orders of the defined types were placed in this encounter.  No orders of the defined types were placed in this encounter.     Procedures: No procedures performed   Clinical Data: No additional findings.   Subjective: Chief Complaint  Patient presents with  . Left Shoulder - Pain    Patient follows up today for his left shoulder. His right hip is doing fine. He is improved significantly with physical therapy. He still feels just a little bit of weakness but overall he doesn't have any real complaints.    Review of Systems   Objective: Vital Signs: There were no vitals taken for this visit.  Physical Exam  Ortho Exam Shoulder exam shows focal weakness of infraspinatus muscle. No impingement signs. Overall well compensated rotator cuff function. Specialty Comments:  No specialty comments available.  Imaging: No results found.   PMFS History: Patient Active Problem List   Diagnosis Date Noted  . Rotator cuff syndrome of left shoulder 05/16/2017  . MGUS (monoclonal gammopathy of unknown significance) 01/10/2016  . Avascular necrosis of bone of left hip ([facility]) 12/25/2013  . 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 History of colonic polyps 02/17/2009  . 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 Iron  deficiency anemia 10/25/2008   Past Medical History:  Diagnosis Date  . Abnormal CT of the chest 04/16/13   . Anemia    sees dr Griffin Basil OF IRON DEFICIENCY ANEMIA FROM INTERMITTENT GI BLOOD LOSS RELATED TO SMALL BOWEL AVMS  . Avascular necrosis ([facility])    LEFT HIP - PT STATES HE WALKS WITH LIMP AND PAIN AT TIMES  . AVM (arteriovenous malformation) of colon    HX OF GI BLOOD LOSS RELATED TO SMALL BOWEL AVM'S  . GERD (gastroesophageal reflux disease)   . Gout   . Hyperlipemia   . Hypertension   . IgG lambda monoclonal gammopathy    "OF UNDETERMINED SIGNIFICANCE" - FOLLOWED BY DR. Beryle Beams  . Lumbar pain    HX O PAIN LEFT LEG AND XRAYS BACK IN 2005 OR 2006 AND TOLD DISC RUBBING ON NERVE - GIVEN INJECTIONS LOWER BACK - DIDN'T SOLVE THE PAIN DOWN LEFT LEG  . Lymphadenopathy, axillary 04/07/2013   3 cm right axillary node S/P BIOPSY - NOT MALIGNANT  . MGUS (monoclonal gammopathy of unknown significance) 01/10/2016  . Sickle cell trait ([place])   . Wears glasses   . Wears partial dentures    top and bottom partials    Family History  Problem Relation Age of Onset  . Colon cancer Neg Hx     Past Surgical History:  Procedure Laterality Date  . APPENDECTOMY    . COLONOSCOPY    .  ENTEROSCOPY  08/24/2011   Procedure: ENTEROSCOPY;  Surgeon: Inda Castle, MD;  [facility];  Service: Endoscopy;  Laterality: N/A;  . HERNIA REPAIR     rt ing  . MASS EXCISION Right 09/01/2013   Procedure: EXCISION AXILLA  MASS;  Surgeon: Joyice Faster. Cornett, MD;  Location: [facility];  Service: General;  Laterality: Right;  . TOTAL HIP ARTHROPLASTY Left 12/25/2013   Procedure: LEFT TOTAL HIP ARTHROPLASTY ANTERIOR APPROACH;  Surgeon: Mcarthur Rossetti, MD;  [facility];  Service: Orthopedics;  Laterality: Left;   Social History   Occupational History  . Retired    .  Retired   Social History Main Topics  . Smoking status: Former Smoker    Quit date: 08/27/1992  .  Smokeless tobacco: Never Used  . Alcohol use 0.0 oz/week     Comment: Gin and beer every other day  . Drug use: No  . Sexual activity: Not on file

## 2018-01-20 ENCOUNTER — Ambulatory Visit (INDEPENDENT_AMBULATORY_CARE_PROVIDER_SITE_OTHER): Payer: Medicare Other

## 2018-01-20 ENCOUNTER — Ambulatory Visit (INDEPENDENT_AMBULATORY_CARE_PROVIDER_SITE_OTHER): Payer: Medicare Other | Admitting: Orthopaedic Surgery

## 2018-01-20 ENCOUNTER — Encounter (INDEPENDENT_AMBULATORY_CARE_PROVIDER_SITE_OTHER): Payer: Self-pay | Admitting: Orthopaedic Surgery

## 2018-01-20 DIAGNOSIS — M25551 Pain in right hip: Secondary | ICD-10-CM

## 2018-01-20 DIAGNOSIS — M545 Low back pain: Secondary | ICD-10-CM

## 2018-01-20 DIAGNOSIS — M7061 Trochanteric bursitis, right hip: Secondary | ICD-10-CM

## 2018-01-20 MED ORDER — METHYLPREDNISOLONE 4 MG PO TABS: ORAL_TABLET | ORAL | 0 refills | Status: DC

## 2018-01-20 MED ORDER — LIDOCAINE HCL 1 % IJ SOLN
3.0000 mL | INTRAMUSCULAR | Status: AC | PRN
  Administered 2018-01-20: 3 mL

## 2018-01-20 MED ORDER — NABUMETONE 500 MG PO TABS: 500.0000 mg | ORAL_TABLET | Freq: Two times a day (BID) | ORAL | 3 refills | Status: DC | PRN

## 2018-01-20 MED ORDER — METHYLPREDNISOLONE ACETATE 40 MG/ML IJ SUSP
40.0000 mg | INTRAMUSCULAR | Status: AC | PRN
  Administered 2018-01-20: 40 mg via INTRA_ARTICULAR

## 2018-01-20 NOTE — Progress Notes (Signed)
Office Visit Note   Patient: [name]           Date of Birth: 1940-12-12           MRN: [id number] Visit Date: 01/20/2018              Requested by: Haywood Pao, MD [address] [place], [place] [address] PCP: Osborne Casco Fransico Him, MD   Assessment & Plan: Visit Diagnoses:  1. Pain in right hip   2. Trochanteric bursitis, right hip     Plan: I do than some trochanteric bursitis.  Based on his clinical exam showing no pain in the groin fluid range of motion of his right hip combined with normal x-rays I gave him reassurance that I do not feel this is a hip issue I tried a steroid injection around the trochanteric area after talking about the risk and benefits of the injection in this recommendation.  He also try this as well.  We will also try anti-inflammatories.  We will see him back in a month overall.  Follow-Up Instructions: Return in about 1 month (around 02/17/2018).   Orders:  Orders Placed This Encounter  Procedures  . Large Joint Inj  . XR HIP UNILAT W OR W/O PELVIS 1V RIGHT  . XR Lumbar Spine 2-3 Views   Meds ordered this encounter  Medications  . methylPREDNISolone (MEDROL) 4 MG tablet    Sig: Medrol dose pack. Take as instructed    Dispense:  21 tablet    Refill:  0  . nabumetone (RELAFEN) 500 MG tablet    Sig: Take 1 tablet (500 mg total) by mouth 2 (two) times daily as needed.    Dispense:  60 tablet    Refill:  3      Procedures: Large Joint Inj: R greater trochanter on 01/20/2018 3:22 PM Indications: pain and diagnostic evaluation Details: 22 G 1.5 in needle, lateral approach  Arthrogram: No  Medications: 3 mL lidocaine 1 %; 40 mg methylPREDNISolone acetate 40 MG/ML Outcome: tolerated well, no immediate complications Procedure, treatment alternatives, risks and benefits explained, specific risks discussed. Consent was given by the patient. Immediately prior to procedure a time out was called to verify the correct patient, procedure, equipment,  support staff and site/side marked as required. Patient was prepped and draped in the usual sterile fashion.       Clinical Data: No additional findings.   Subjective: Chief Complaint  Patient presents with  . Right Hip - Pain  . Spine - Pain   The patient is someone I seen in the past.  We performed a left total hip arthroplasty on him 4 years ago and he said left hip has no issues at all.  He said he said about a years worth of right hip pain he points lateral aspect inside of his right hip as source of his pain.  He had some groin pain as well.  Some of this seems to be low back.  This is been slowly getting worse with time and he wanted to have it checked out again.  He denies any change in bowel bladder function or weakness in his leg.  Again there is no issues of the left side. HPI  Review of Systems He currently denies any headache, chest pain, shortness of breath, fever, chills, nausea, vomiting.  He is alert and oriented x3 and in no acute distress.  He does not walk with assistive device and he does not have a limp.  Objective: Vital Signs: There were no vitals taken for this visit.  Physical Exam See above. Ortho Exam Examination of his right hip shows full internal and external rotation with no pain in the groin at all.  He only has some pain over trochanteric area palpating this and the IT band.  He does have some low back pain and slight sciatica to the right side but he has excellent strength in his right lower extremity no issues at all distally. Specialty Comments:  No specialty comments available.  Imaging: Xr Hip Unilat W Or W/o Pelvis 1v Right  Result Date: 01/20/2018 An AP pelvis and lateral of the right hip shows a normal-appearing right hip.  There are no acute findings.  The joint space is well-maintained.  There is a left total hip arthroplasty can be seen on the AP view shows no complicating features.  Xr Lumbar Spine 2-3 Views  Result Date: 01/20/2018 2  views lumbar spine show significant degenerative disc disease and spondylosis at multiple levels.    PMFS History: Patient Active Problem List   Diagnosis Date Noted  . Rotator cuff syndrome of left shoulder 05/16/2017  . MGUS (monoclonal gammopathy of unknown significance) 01/10/2016  . Avascular necrosis of bone of left hip ([place]) 12/25/2013  . 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 History of colonic polyps 02/17/2009  . 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 Iron deficiency anemia 10/25/2008   Past Medical History:  Diagnosis Date  . Abnormal CT of the chest 04/16/13   . Anemia    sees dr Griffin Basil OF IRON DEFICIENCY ANEMIA FROM INTERMITTENT GI BLOOD LOSS RELATED TO SMALL BOWEL AVMS  . Avascular necrosis ([place])    LEFT HIP - PT STATES HE WALKS WITH LIMP AND PAIN AT TIMES  . AVM (arteriovenous malformation) of colon    HX OF GI BLOOD LOSS RELATED TO SMALL BOWEL AVM'S  . GERD (gastroesophageal reflux disease)   . Gout   . Hyperlipemia   . Hypertension   . IgG lambda monoclonal gammopathy    "OF UNDETERMINED SIGNIFICANCE" - FOLLOWED BY DR. Beryle Beams  . Lumbar pain    HX O PAIN LEFT LEG AND XRAYS BACK IN 2005 OR 2006 AND TOLD DISC RUBBING ON NERVE - GIVEN INJECTIONS LOWER BACK - DIDN'T SOLVE THE PAIN DOWN LEFT LEG  . Lymphadenopathy, axillary 04/07/2013   3 cm right axillary node S/P BIOPSY - NOT MALIGNANT  . MGUS (monoclonal gammopathy of unknown significance) 01/10/2016  . Sickle cell trait ([facility])   . Wears glasses   . Wears partial dentures    top and bottom partials    Family History  Problem Relation Age of Onset  . Colon cancer Neg Hx     Past Surgical History:  Procedure Laterality Date  . APPENDECTOMY    . COLONOSCOPY    . ENTEROSCOPY  08/24/2011   Procedure: ENTEROSCOPY;  Surgeon: Inda Castle, MD;  [facility];  Service: Endoscopy;  Laterality: N/A;  . HERNIA REPAIR     rt ing  . MASS EXCISION Right 09/01/2013    Procedure: EXCISION AXILLA  MASS;  Surgeon: Joyice Faster. Cornett, MD;  Location: [facility];  Service: General;  Laterality: Right;  . TOTAL HIP ARTHROPLASTY Left 12/25/2013   Procedure: LEFT TOTAL HIP ARTHROPLASTY ANTERIOR APPROACH;  Surgeon: Mcarthur Rossetti, MD;  [facility];  Service: Orthopedics;  Laterality: Left;   Social History   Occupational History  . Occupation: Retired  Employer: RETIRED  Tobacco Use  . Smoking status: Former Smoker    Last attempt to quit: 08/27/1992    Years since quitting: 25.4  . Smokeless tobacco: Never Used  Substance and Sexual Activity  . Alcohol use: Yes    Alcohol/week: 0.0 oz    Comment: Gin and beer every other day  . Drug use: No  . Sexual activity: Not on file

## 2018-02-13 ENCOUNTER — Encounter: Payer: Self-pay | Admitting: Urgent Care

## 2018-02-13 ENCOUNTER — Ambulatory Visit (INDEPENDENT_AMBULATORY_CARE_PROVIDER_SITE_OTHER): Payer: Medicare Other | Admitting: Urgent Care

## 2018-02-13 ENCOUNTER — Other Ambulatory Visit: Payer: Self-pay

## 2018-02-13 VITALS — BP 127/76 | HR 104 | Temp 97.9°F | Ht 74.0 in | Wt 210.2 lb

## 2018-02-13 DIAGNOSIS — R000 Tachycardia, unspecified: Secondary | ICD-10-CM | POA: Diagnosis not present

## 2018-02-13 NOTE — Progress Notes (Signed)
    MRN: [id number] DOB: 11/18/1940  Subjective:   [name] is a 77 y.o. male presenting for elevated heart rate while at home this morning.  Patient has a PCP, states that he contacted their office and was advised to report to our urgent care.  Patient noted that elevated heart rate as high as 108 when his wife decided to check his blood pressure this morning.  He denies dizziness, confusion, chest pain, palpitations, nausea, vomiting, belly pain, diaphoresis.  Denies a history of heart disease and heart attacks.  Reports that he has been taking atenolol for years.  He has about 3 drinks of liquor per day.  [name] has a current medication list which includes the following prescription(s): allopurinol, atenolol, atorvastatin, cinnamon, iron polysaccharides, multivitamin with minerals, and ranitidine. Also has No Known Allergies.  [name]  has a past medical history of Abnormal CT of the chest (04/16/13 ), Anemia, Avascular necrosis ([place]), AVM (arteriovenous malformation) of colon, GERD (gastroesophageal reflux disease), Gout, Hyperlipemia, Hypertension, IgG lambda monoclonal gammopathy, Lumbar pain, Lymphadenopathy, axillary (04/07/2013), MGUS (monoclonal gammopathy of unknown significance) (01/10/2016), Sickle cell trait ([place]), Wears glasses, and Wears partial dentures. Also  has a past surgical history that includes Appendectomy; enteroscopy (08/24/2011); Colonoscopy; Hernia repair; Mass excision (Right, 09/01/2013); and Total hip arthroplasty (Left, 12/25/2013).  Objective:   Vitals: BP 127/76 (BP Location: Left Arm, Patient Position: Sitting, Cuff Size: Normal)   Pulse (!) 104   Temp 97.9 F (36.6 C) (Oral)   Ht 6\' 2"  (1.88 m)   Wt 210 lb 3.2 oz (95.3 kg)   SpO2 97%   BMI 26.99 kg/m   Physical Exam  Constitutional: He is oriented to person, place, and time. He appears well-developed and well-nourished.  HENT:  Mouth/Throat: Oropharynx is clear and moist.  Eyes: No scleral icterus.    Cardiovascular: Normal rate, regular rhythm, normal heart sounds and intact distal pulses. Exam reveals no gallop and no friction rub.  No murmur heard. No carotid bruits.  Pulmonary/Chest: No stridor. No respiratory distress. He has no wheezes. He has no rales.  Neurological: He is alert and oriented to person, place, and time.  Skin: Skin is warm and dry.  Psychiatric: He has a normal mood and affect.   ECG interpretation - sinus rhythm at 94bpm.  Assessment and Plan :   Fast heart beat - Plan: EKG 12-Lead  Patient's physical exam findings and EKG is very reassuring today.  Recommended he follow-up with his PCP.  ER and return to clinic precautions discussed.  Jaynee Eagles, PA-C [facility] [phone] 02/13/2018  4:56 PM

## 2018-02-13 NOTE — Patient Instructions (Addendum)
Please follow up with your PCP. Today, your EKG, a test we use to measure the electrical activity of your heart in real time, looked very good. Your heart sounds are normal, have a good rhythm. Try to limit your alcohol to one drink per day so that this does not affect your heart.    Sinus Tachycardia Sinus tachycardia is a kind of fast heartbeat. In sinus tachycardia, the heart beats more than 100 times a minute. Sinus tachycardia starts in a part of the heart called the sinus node. Sinus tachycardia may be harmless, or it may be a sign of a serious condition. What are the causes? This condition may be caused by:  Exercise or exertion.  A fever.  Pain.  Loss of body fluids (dehydration).  Severe bleeding (hemorrhage).  Anxiety and stress.  Certain substances, including: ? Alcohol. ? Caffeine. ? Tobacco and nicotine products. ? Diet pills. ? Illegal drugs.  Medical conditions including: ? Heart disease. ? An infection. ? An overactive thyroid (hyperthyroidism). ? A lack of red blood cells (anemia).  What are the signs or symptoms? Symptoms of this condition include:  A feeling that the heart is beating quickly (palpitations).  Suddenly noticing your heartbeat (cardiac awareness).  Dizziness.  Tiredness (fatigue).  Shortness of breath.  Chest pain.  Nausea.  Fainting.  How is this diagnosed? This condition is diagnosed with:  A physical exam.  Other tests, such as: ? Blood tests. ? An electrocardiogram (ECG). This test measures the electrical activity of the heart. ? Holter monitoring. For this test, you wear a device that records your heartbeat for one or more days.  You may be referred to a heart specialist (cardiologist). How is this treated? Treatment for this condition depends on the cause or underlying condition. Treatment may involve:  Treating the underlying condition.  Taking new medicines or changing your current medicines as told by your  health care provider.  Making changes to your diet or lifestyle.  Practicing relaxation methods.  Follow these instructions at home: Lifestyle  Do not use any products that contain nicotine or tobacco, such as cigarettes and e-cigarettes. If you need help quitting, ask your health care provider.  Learn relaxation methods, like deep breathing, to help you when you get stressed or anxious.  Do not use illegal drugs, such as cocaine.  Do not abuse alcohol. Limit alcohol intake to no more than 1 drink a day for non-pregnant women and 2 drinks a day for men. One drink equals 12 oz of beer, 5 oz of wine, or 1 oz of hard liquor.  Find time to rest and relax often. This reduces stress.  Avoid: ? Caffeine. ? Stimulants such as over-the-counter diet pills or pills that help you to stay awake. ? Situations that cause anxiety or stress. General instructions  Drink enough fluids to keep your urine clear or pale yellow.  Take over-the-counter and prescription medicines only as told by your health care provider.  Keep all follow-up visits as told by your health care provider. This is important. Contact a health care provider if:  You have a fever.  You have vomiting or diarrhea that keeps happening (is persistent). Get help right away if:  You have pain in your chest, upper arms, jaw, or neck.  You become weak or dizzy.  You feel faint.  You have palpitations that do not go away. This information is not intended to replace advice given to you by your health care provider. Make sure  you discuss any questions you have with your health care provider. Document Released: 10/11/2004 Document Revised: 03/31/2016 Document Reviewed: 03/18/2015 Elsevier Interactive Patient Education  2018 [employer].     IF you received an x-ray today, you will receive an invoice from [facility]. Please contact [facility] at [phone] with questions or concerns regarding your  invoice.   IF you received labwork today, you will receive an invoice from [facility]. Please contact [facility] at [phone] with questions or concerns regarding your invoice.   Our billing staff will not be able to assist you with questions regarding bills from these companies.  You will be contacted with the lab results as soon as they are available. The fastest way to get your results is to activate your My Chart account. Instructions are located on the last page of this paperwork. If you have not heard from [place] regarding the results in 2 weeks, please contact this office.

## 2018-02-17 ENCOUNTER — Ambulatory Visit (INDEPENDENT_AMBULATORY_CARE_PROVIDER_SITE_OTHER): Payer: Medicare Other | Admitting: Orthopaedic Surgery

## 2018-02-17 ENCOUNTER — Encounter (INDEPENDENT_AMBULATORY_CARE_PROVIDER_SITE_OTHER): Payer: Self-pay | Admitting: Orthopaedic Surgery

## 2018-02-17 DIAGNOSIS — M7061 Trochanteric bursitis, right hip: Secondary | ICD-10-CM | POA: Diagnosis not present

## 2018-02-17 NOTE — Progress Notes (Signed)
Patient is following up 1 month after trochanteric injection his right hip due to hip pain.  He says that did great and he is almost pain-free.  He does take nabumetone as needed.  He likes I gave him refills for this.  On examination there is no pain in his left hip or right hip at all.  The right hip is only injected.  He has fluid and full range of motion of both hips with no difficulty at all and no pain of the trochanteric area to palpation.  At this point he will follow-up as needed.  He will continue his anti-inflammatories as needed.  All questions concerns were answered and addressed.

## 2018-06-17 ENCOUNTER — Ambulatory Visit (INDEPENDENT_AMBULATORY_CARE_PROVIDER_SITE_OTHER): Payer: Medicare Other | Admitting: Orthopaedic Surgery

## 2018-06-17 ENCOUNTER — Encounter (INDEPENDENT_AMBULATORY_CARE_PROVIDER_SITE_OTHER): Payer: Self-pay | Admitting: Orthopaedic Surgery

## 2018-06-17 DIAGNOSIS — M7061 Trochanteric bursitis, right hip: Secondary | ICD-10-CM | POA: Diagnosis not present

## 2018-06-17 MED ORDER — LIDOCAINE HCL 1 % IJ SOLN
3.0000 mL | INTRAMUSCULAR | Status: AC | PRN
  Administered 2018-06-17: 3 mL

## 2018-06-17 MED ORDER — METHYLPREDNISOLONE ACETATE 40 MG/ML IJ SUSP
40.0000 mg | INTRAMUSCULAR | Status: AC | PRN
  Administered 2018-06-17: 40 mg via INTRA_ARTICULAR

## 2018-06-17 NOTE — Progress Notes (Signed)
   Procedure Note  Patient: Raymir Frommelt             Date of Birth: 1941/04/21           MRN: [id number]             Visit Date: 06/17/2018   HPI Mr. Wissmann comes in today with 4 days of right hip pain.  He has had no known injury.  Exam pain lateral aspect of the right hip.  States the pain gets about on Sunday he had to use a cane.  He did take some Aleve and states he woke up on Monday and that he had no real pain in the hip.  He denies any numbness tingling down the leg or any back pain.  Denies any groin pain on the right.  Status post left total hip arthroplasty now 5 years out overall doing well.  Physical exam: Right hip fluid range of motion of the hip with internal and external rotation without pain.  Tight hamstrings with negative straight leg raise.  Ambulates without any assistive device.  Tenderness over the right trochanteric region. Procedures: Visit Diagnoses: Trochanteric bursitis, right hip  Large Joint Inj: R greater trochanter on 06/17/2018 11:05 AM Indications: pain Details: 22 G 1.5 in needle, lateral approach  Arthrogram: No  Medications: 3 mL lidocaine 1 %; 40 mg methylPREDNISolone acetate 40 MG/ML Outcome: tolerated well, no immediate complications Procedure, treatment alternatives, risks and benefits explained, specific risks discussed. Consent was given by the patient. Immediately prior to procedure a time out was called to verify the correct patient, procedure, equipment, support staff and site/side marked as required. Patient was prepped and draped in the usual sterile fashion.     Plan: IT band stretching exercises are shown had patient demonstrate these back.  He follow-up with [place] on as-needed basis pain persist or becomes worse.  Questions encouraged and answered.

## 2018-11-04 ENCOUNTER — Encounter: Payer: Self-pay | Admitting: Gastroenterology

## 2018-11-19 ENCOUNTER — Other Ambulatory Visit: Payer: Self-pay

## 2018-11-19 ENCOUNTER — Encounter: Payer: Self-pay | Admitting: Gastroenterology

## 2018-11-19 ENCOUNTER — Telehealth: Payer: Self-pay | Admitting: Gastroenterology

## 2018-11-19 ENCOUNTER — Ambulatory Visit (AMBULATORY_SURGERY_CENTER): Payer: Self-pay

## 2018-11-19 VITALS — Ht 74.0 in | Wt 219.8 lb

## 2018-11-19 DIAGNOSIS — Z86010 Personal history of colonic polyps: Secondary | ICD-10-CM

## 2018-11-19 MED ORDER — PEG-KCL-NACL-NASULF-NA ASC-C 140 G PO SOLR: 1.0000 | Freq: Once | ORAL | 0 refills | Status: AC

## 2018-11-19 NOTE — Telephone Encounter (Signed)
Pt is aware to pick up free sample kit at the front desk.

## 2018-11-19 NOTE — Telephone Encounter (Signed)
Pt is sched for colon 3.17.20.  Pt informed that he is not eligible for Plenvu coupon due to having Medicare.  Out of pocket price is $140 and pt will not be able to afford prep.

## 2018-11-19 NOTE — Progress Notes (Signed)
No egg or soy allergy known to patient  No issues with past sedation with any surgeries  or procedures, no intubation problems  No diet pills per patient No home 02 use per patient  No blood thinners per patient  Pt denies issues with constipation  No A fib or A flutter  EMMI video sent to pt's e mail , pt declined    

## 2018-12-01 ENCOUNTER — Telehealth: Payer: Self-pay | Admitting: *Deleted

## 2018-12-01 NOTE — Telephone Encounter (Signed)
Covid-19 travel screening questions  Have you traveled in the last 14 days? No If yes where?  Do you now or have you had a fever in the last 14 days? No  Do you have any respiratory symptoms of shortness of breath or cough now or in the last 14 days? No  Do you have any family members or close contacts with diagnosed or suspected Covid-19? No       

## 2018-12-01 NOTE — Telephone Encounter (Signed)
Covid-19 travel screening questions  Have you traveled in the last 14 days? If yes where?  Do you now or have you had a fever in the last 14 days?  Do you have any respiratory symptoms of shortness of breath or cough now or in the last 14 days?  Do you have a medical history of Congestive Heart Failure?  Do you have a medical history of lung disease?  Do you have any family members or close contacts with diagnosed or suspected Covid-19?    1525: Left message for patient to return call

## 2018-12-02 ENCOUNTER — Other Ambulatory Visit: Payer: Self-pay

## 2018-12-02 ENCOUNTER — Encounter: Payer: Self-pay | Admitting: Gastroenterology

## 2018-12-02 ENCOUNTER — Ambulatory Visit (AMBULATORY_SURGERY_CENTER): Payer: Medicare Other | Admitting: Gastroenterology

## 2018-12-02 VITALS — BP 115/66 | HR 84 | Temp 97.1°F | Resp 12 | Ht 74.0 in | Wt 219.0 lb

## 2018-12-02 DIAGNOSIS — D122 Benign neoplasm of ascending colon: Secondary | ICD-10-CM

## 2018-12-02 DIAGNOSIS — Z86010 Personal history of colonic polyps: Secondary | ICD-10-CM

## 2018-12-02 MED ORDER — SODIUM CHLORIDE 0.9 % IV SOLN: 500.0000 mL | Freq: Once | INTRAVENOUS | Status: DC

## 2018-12-02 NOTE — Progress Notes (Signed)
To PACU, VSS. Report to [email or web address] 

## 2018-12-02 NOTE — Progress Notes (Signed)
Pt's states no medical or surgical changes since previsit or office visit. 

## 2018-12-02 NOTE — Op Note (Signed)
[facility] Patient Name: [name] Procedure Date: 12/02/2018 10:58 AM MRN: [id number] Endoscopist: [place]. Loletha Carrow , MD Age: 78 Referring MD:  Date of Birth: [birth year] Gender: Male Account #: [id number] Procedure:                Colonoscopy Indications:              Surveillance: Personal history of adenomatous                            polyps on last colonoscopy > 5 years ago (adenoma                            12/2008) Medicines:                Monitored Anesthesia Care Procedure:                Pre-Anesthesia Assessment:                           - Prior to the procedure, a History and Physical                            was performed, and patient medications and                            allergies were reviewed. The patient's tolerance of                            previous anesthesia was also reviewed. The risks                            and benefits of the procedure and the sedation                            options and risks were discussed with the patient.                            All questions were answered, and informed consent                            was obtained. Prior Anticoagulants: The patient has                            taken no previous anticoagulant or antiplatelet                            agents. ASA Grade Assessment: II - A patient with                            mild systemic disease. After reviewing the risks                            and benefits, the patient was deemed in  satisfactory condition to undergo the procedure.                           After obtaining informed consent, the colonoscope                            was passed under direct vision. Throughout the                            procedure, the patient's blood pressure, pulse, and                            oxygen saturations were monitored continuously. The                            Model CF-HQ190L [id number]) scope was introduced                             through the anus and advanced to the the cecum,                            identified by appendiceal orifice and ileocecal                            valve. The colonoscopy was performed without                            difficulty. The patient tolerated the procedure                            well. The quality of the bowel preparation was                            excellent. The ileocecal valve, appendiceal                            orifice, and rectum were photographed. Scope In: 11:10:41 AM Scope Out: 11:23:56 AM Scope Withdrawal Time: 0 hours 9 minutes 11 seconds  Total Procedure Duration: 0 hours 13 minutes 15 seconds  Findings:                 The perianal and digital rectal examinations were                            normal.                           A diminutive polyp was found in the ascending                            colon. The polyp was sessile. The polyp was removed                            with a cold biopsy forceps. Resection and retrieval  were complete.                           A few small-mouthed diverticula were found in the                            left colon.                           The exam was otherwise without abnormality on                            direct and retroflexion views. Complications:            No immediate complications. Estimated Blood Loss:     Estimated blood loss was minimal. Impression:               - One diminutive polyp in the ascending colon,                            removed with a cold biopsy forceps. Resected and                            retrieved.                           - Diverticulosis in the left colon.                           - The examination was otherwise normal on direct                            and retroflexion views. Recommendation:           - Patient has a contact number available for                            emergencies. The signs and symptoms of potential                             delayed complications were discussed with the                            patient. Return to normal activities tomorrow.                            Written discharge instructions were provided to the                            patient.                           - Resume previous diet.                           - Continue present medications.                           -  Await pathology results.                           - Based on current guidelines, no repeat                            surveillance colonoscopy. Henry L. Loletha Carrow, MD 12/02/2018 11:28:49 AM This report has been signed electronically.

## 2018-12-02 NOTE — Patient Instructions (Signed)
Thank ou for allowing [place] to care for you today!  Await pathology results by mail, approximately 2 weeks.  Based on current age guidelines, no further colonoscopies recommended.     YOU HAD AN ENDOSCOPIC PROCEDURE TODAY AT [facility]:   Refer to the procedure report that was given to you for any specific questions about what was found during the examination.  If the procedure report does not answer your questions, please call your gastroenterologist to clarify.  If you requested that your care partner not be given the details of your procedure findings, then the procedure report has been included in a sealed envelope for you to review at your convenience later.  YOU SHOULD EXPECT: Some feelings of bloating in the abdomen. Passage of more gas than usual.  Walking can help get rid of the air that was put into your GI tract during the procedure and reduce the bloating. If you had a lower endoscopy (such as a colonoscopy or flexible sigmoidoscopy) you may notice spotting of blood in your stool or on the toilet paper. If you underwent a bowel prep for your procedure, you may not have a normal bowel movement for a few days.  Please Note:  You might notice some irritation and congestion in your nose or some drainage.  This is from the oxygen used during your procedure.  There is no need for concern and it should clear up in a day or so.  SYMPTOMS TO REPORT IMMEDIATELY:   Following lower endoscopy (colonoscopy or flexible sigmoidoscopy):  Excessive amounts of blood in the stool  Significant tenderness or worsening of abdominal pains  Swelling of the abdomen that is new, acute  Fever of 100F or higher    For urgent or emergent issues, a gastroenterologist can be reached at any hour by calling [phone].   DIET:  We do recommend a small meal at first, but then you may proceed to your regular diet.  Drink plenty of fluids but you should avoid alcoholic beverages for 24  hours.  ACTIVITY:  You should plan to take it easy for the rest of today and you should NOT DRIVE or use heavy machinery until tomorrow (because of the sedation medicines used during the test).    FOLLOW UP: Our staff will call the number listed on your records the next business day following your procedure to check on you and address any questions or concerns that you may have regarding the information given to you following your procedure. If we do not reach you, we will leave a message.  However, if you are feeling well and you are not experiencing any problems, there is no need to return our call.  We will assume that you have returned to your regular daily activities without incident.  If any biopsies were taken you will be contacted by phone or by letter within the next 1-3 weeks.  Please call us at [phone] if you have not heard about the biopsies in 3 weeks.    SIGNATURES/CONFIDENTIALITY: You and/or your care partner have signed paperwork which will be entered into your electronic medical record.  These signatures attest to the fact that that the information above on your After Visit Summary has been reviewed and is understood.  Full responsibility of the confidentiality of this discharge information lies with you and/or your care-partner.

## 2018-12-02 NOTE — Progress Notes (Signed)
Called to room to assist during endoscopic procedure.  Patient ID and intended procedure confirmed with present staff. Received instructions for my participation in the procedure from the performing physician.  

## 2018-12-03 ENCOUNTER — Telehealth: Payer: Self-pay

## 2018-12-03 NOTE — Telephone Encounter (Signed)
  Follow up Call-  Call back number 12/02/2018  Post procedure Call Back phone  # [phone]  Permission to leave phone message Yes  Some recent data might be hidden     Patient questions:  Do you have a fever, pain , or abdominal swelling? No. Pain Score  0 *  Have you tolerated food without any problems? Yes.    Have you been able to return to your normal activities? Yes.    Do you have any questions about your discharge instructions: Diet   No. Medications  No. Follow up visit  No.  Do you have questions or concerns about your Care? No.  Actions: * If pain score is 4 or above: No action needed, pain <4.

## 2018-12-09 ENCOUNTER — Encounter: Payer: Self-pay | Admitting: Gastroenterology

## 2019-07-17 ENCOUNTER — Other Ambulatory Visit: Payer: Self-pay

## 2019-07-17 DIAGNOSIS — Z20822 Contact with and (suspected) exposure to covid-19: Secondary | ICD-10-CM

## 2019-07-19 LAB — NOVEL CORONAVIRUS, NAA: SARS-CoV-2, NAA: NOT DETECTED

## 2019-12-09 ENCOUNTER — Ambulatory Visit: Payer: Medicare Other | Admitting: Physician Assistant

## 2019-12-09 ENCOUNTER — Encounter: Payer: Self-pay | Admitting: Physician Assistant

## 2019-12-09 ENCOUNTER — Ambulatory Visit: Payer: Self-pay

## 2019-12-09 ENCOUNTER — Other Ambulatory Visit: Payer: Self-pay

## 2019-12-09 DIAGNOSIS — M25551 Pain in right hip: Secondary | ICD-10-CM | POA: Diagnosis not present

## 2019-12-09 MED ORDER — METHYLPREDNISOLONE ACETATE 40 MG/ML IJ SUSP
40.0000 mg | INTRAMUSCULAR | Status: AC | PRN
  Administered 2019-12-09: 40 mg via INTRA_ARTICULAR

## 2019-12-09 MED ORDER — LIDOCAINE HCL 1 % IJ SOLN
3.0000 mL | INTRAMUSCULAR | Status: AC | PRN
  Administered 2019-12-09: 3 mL

## 2019-12-09 MED ORDER — HYDROCODONE-ACETAMINOPHEN 5-325 MG PO TABS: 1.0000 | ORAL_TABLET | ORAL | 0 refills | Status: DC | PRN

## 2019-12-09 NOTE — Progress Notes (Signed)
Office Visit Note   Patient: [name]           Date of Birth: 01-01-1941           MRN: [id number] Visit Date: 12/09/2019              Requested by: Haywood Pao, MD [address] [place],  [place] [address] PCP: Osborne Casco Fransico Him, MD   Assessment & Plan: Visit Diagnoses:  1. Pain in right hip     Plan: Recommend IT band stretching exercises.  He does have asked for some pain medicine due to the fact that he is having difficulty sleeping due to the pain.  He is given a limited supply of Norco that he is to use sparingly.  He will follow-up with [place] as needed.  Follow-Up Instructions: Return if symptoms worsen or fail to improve.   Orders:  Orders Placed This Encounter  Procedures  . Large Joint Inj  . XR HIP UNILAT W OR W/O PELVIS 2-3 VIEWS RIGHT   Meds ordered this encounter  Medications  . HYDROcodone-acetaminophen (NORCO) 5-325 MG tablet    Sig: Take 1 tablet by mouth every 4 (four) hours as needed for moderate pain.    Dispense:  20 tablet    Refill:  0      Procedures: Large Joint Inj: R greater trochanter on 12/09/2019 4:52 PM Indications: pain Details: 22 G 1.5 in needle, lateral approach  Arthrogram: No  Medications: 3 mL lidocaine 1 %; 40 mg methylPREDNISolone acetate 40 MG/ML Outcome: tolerated well, no immediate complications Procedure, treatment alternatives, risks and benefits explained, specific risks discussed. Consent was given by the patient. Immediately prior to procedure a time out was called to verify the correct patient, procedure, equipment, support staff and site/side marked as required. Patient was prepped and draped in the usual sterile fashion.       Clinical Data: No additional findings.   Subjective: Chief Complaint  Patient presents with  . Right Hip - Pain    HPI  [name] comes in today with right hip pain for the past week.  No known injury.  Patient states pain is worse when he gets up and down.  Pain does  radiate down from the groin area to the knee.  He needs whenever he lies on the hip is more painful.  History of left total hip arthroplasty 2015 which is doing well.  Review of Systems Negative for fevers chills shortness of breath chest pain  Objective: Vital Signs: There were no vitals taken for this visit.  Physical Exam Constitutional:      Appearance: He is normal weight. He is not ill-appearing or diaphoretic.  Pulmonary:     Effort: Pulmonary effort is normal.  Neurological:     Mental Status: He is alert and oriented to person, place, and time.  Psychiatric:        Mood and Affect: Mood normal.     Ortho Exam Left hip good range of motion.  Right hip slight stiffness with internal and external rotation but no significant pain.  Tenderness over the right trochanteric region. Specialty Comments:  No specialty comments available.  Imaging: XR HIP UNILAT W OR W/O PELVIS 2-3 VIEWS RIGHT  Result Date: 12/09/2019 AP pelvis lateral view right hip: No acute fractures.  Bilateral hips well located.  Left total hip arthroplasty components well-seated without any signs of hardware failure.  Right hip slight cystic changes but overall the hip joint remains well  maintained.    PMFS History: Patient Active Problem List   Diagnosis Date Noted  . Rotator cuff syndrome of left shoulder 05/16/2017  . MGUS (monoclonal gammopathy of unknown significance) 01/10/2016  . Avascular necrosis of bone of left hip ([place] [place]) 12/25/2013  . 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 History of colonic polyps 02/17/2009  . 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 Iron deficiency anemia 10/25/2008   Past Medical History:  Diagnosis Date  . Abnormal CT of the chest 04/16/13   . Anemia    sees dr Griffin Basil OF IRON DEFICIENCY ANEMIA FROM INTERMITTENT GI BLOOD LOSS RELATED TO SMALL BOWEL AVMS  . Avascular necrosis ([facility])    LEFT HIP - PT STATES HE WALKS WITH LIMP AND PAIN AT  TIMES  . AVM (arteriovenous malformation) of colon    HX OF GI BLOOD LOSS RELATED TO SMALL BOWEL AVM'S  . GERD (gastroesophageal reflux disease)   . Gout   . Hyperlipemia   . Hypertension   . IgG lambda monoclonal gammopathy    "OF UNDETERMINED SIGNIFICANCE" - FOLLOWED BY DR. Beryle Beams  . Lumbar pain    HX O PAIN LEFT LEG AND XRAYS BACK IN 2005 OR 2006 AND TOLD DISC RUBBING ON NERVE - GIVEN INJECTIONS LOWER BACK - DIDN'T SOLVE THE PAIN DOWN LEFT LEG  . Lymphadenopathy, axillary 04/07/2013   3 cm right axillary node S/P BIOPSY - NOT MALIGNANT  . MGUS (monoclonal gammopathy of unknown significance) 01/10/2016  . Sickle cell trait ([place])   . Wears glasses   . Wears partial dentures    top and bottom partials    Family History  Problem Relation Age of Onset  . Colon cancer Neg Hx   . Esophageal cancer Neg Hx   . Rectal cancer Neg Hx   . Stomach cancer Neg Hx     Past Surgical History:  Procedure Laterality Date  . APPENDECTOMY    . COLONOSCOPY    . ENTEROSCOPY  08/24/2011   Procedure: ENTEROSCOPY;  Surgeon: Inda Castle, MD;  Location: [facility];  Service: Endoscopy;  Laterality: N/A;  . HERNIA REPAIR     rt ing  . MASS EXCISION Right 09/01/2013   Procedure: EXCISION AXILLA  MASS;  Surgeon: Joyice Faster. Cornett, MD;  Location: [facility];  Service: General;  Laterality: Right;  . TOTAL HIP ARTHROPLASTY Left 12/25/2013   Procedure: LEFT TOTAL HIP ARTHROPLASTY ANTERIOR APPROACH;  Surgeon: Mcarthur Rossetti, MD;  Location: [facility];  Service: Orthopedics;  Laterality: Left;   Social History   Occupational History  . Occupation: Retired     Fish farm manager: RETIRED  Tobacco Use  . Smoking status: Former Smoker    Quit date: 08/27/1992    Years since quitting: 27.3  . Smokeless tobacco: Never Used  Substance and Sexual Activity  . Alcohol use: Yes    Alcohol/week: 0.0 standard drinks    Comment: Gin and beer every other day  . Drug use: No  . Sexual activity:  Not on file

## 2019-12-16 ENCOUNTER — Ambulatory Visit: Payer: Medicare Other | Admitting: Family Medicine

## 2019-12-16 ENCOUNTER — Encounter: Payer: Self-pay | Admitting: Family Medicine

## 2019-12-16 ENCOUNTER — Other Ambulatory Visit: Payer: Self-pay

## 2019-12-16 DIAGNOSIS — M25551 Pain in right hip: Secondary | ICD-10-CM | POA: Diagnosis not present

## 2019-12-16 MED ORDER — OXYCODONE-ACETAMINOPHEN 5-325 MG PO TABS: 1.0000 | ORAL_TABLET | Freq: Four times a day (QID) | ORAL | 0 refills | Status: DC | PRN

## 2019-12-16 NOTE — Progress Notes (Signed)
Office Visit Note   Patient: [name]           Date of Birth: 05-Nov-1940           MRN: [id number] Visit Date: 12/16/2019 Requested by: Haywood Pao, MD [address] [place],  [place] [address] PCP: Osborne Casco Fransico Him, MD  Subjective: Chief Complaint  Patient presents with  . Right Hip - Pain    Persistent lateral hip pain. Had cortisone injection in trochanter 12/09/19 - no help. However, he had been having some right groin pain prior to that injection - that did get better.    HPI: He is here with persistent right lateral hip pain.  Recent greater trochanter injection unfortunately did not help like the it has in the past.  He continues to feel pain when bearing weight, feels like the hip might give way.  Occasionally the pain radiates toward the anterior knee and occasionally he has some low back pain.  He is status post left hip replacement and has done well from that standpoint.              ROS:   All other systems were reviewed and are negative.  Objective: Vital Signs: There were no vitals taken for this visit.  Physical Exam:  General:  Alert and oriented, in no acute distress. Pulm:  Breathing unlabored. Psy:  Normal mood, congruent affect. Skin: No bruising or erythema Right hip: He is point tender over the greater trochanter.  Good range of motion and no significant pain with passive hip flexion and internal/external rotation.  Lower extremity strength and reflexes are essentially normal, he has slight weakness bilaterally with hip flexion and that is symmetric.  Imaging: None today  Assessment & Plan: 1.  Persistent right hip pain, probably due to greater trochanter syndrome.  Cannot rule out upper lumbar disc protrusion. -Discussed with him and elected to reinject the hip with cortisone.  If he fails to improve, he will contact me and I will order lumbar MRI scan.     Procedures: Right hip greater trochanter injection: After sterile prep with  Betadine, injected 8 cc 1% lidocaine without epinephrine and 40 mg methylprednisolone into the area of maximal tenderness at the right greater trochanter.  He had excellent immediate relief.    PMFS History: Patient Active Problem List   Diagnosis Date Noted  . Rotator cuff syndrome of left shoulder 05/16/2017  . MGUS (monoclonal gammopathy of unknown significance) 01/10/2016  . Avascular necrosis of bone of left hip ([place]) 12/25/2013  . 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 History of colonic polyps 02/17/2009  . 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 Iron deficiency anemia 10/25/2008   Past Medical History:  Diagnosis Date  . Abnormal CT of the chest 04/16/13   . Anemia    sees dr Griffin Basil OF IRON DEFICIENCY ANEMIA FROM INTERMITTENT GI BLOOD LOSS RELATED TO SMALL BOWEL AVMS  . Avascular necrosis ([facility])    LEFT HIP - PT STATES HE WALKS WITH LIMP AND PAIN AT TIMES  . AVM (arteriovenous malformation) of colon    HX OF GI BLOOD LOSS RELATED TO SMALL BOWEL AVM'S  . GERD (gastroesophageal reflux disease)   . Gout   . Hyperlipemia   . Hypertension   . IgG lambda monoclonal gammopathy    "OF UNDETERMINED SIGNIFICANCE" - FOLLOWED BY DR. Beryle Beams  . Lumbar pain    HX O PAIN LEFT LEG AND XRAYS BACK IN 2005 OR 2006 AND  TOLD DISC RUBBING ON NERVE - GIVEN INJECTIONS LOWER BACK - DIDN'T SOLVE THE PAIN DOWN LEFT LEG  . Lymphadenopathy, axillary 04/07/2013   3 cm right axillary node S/P BIOPSY - NOT MALIGNANT  . MGUS (monoclonal gammopathy of unknown significance) 01/10/2016  . Sickle cell trait ([place])   . Wears glasses   . Wears partial dentures    top and bottom partials    Family History  Problem Relation Age of Onset  . Colon cancer Neg Hx   . Esophageal cancer Neg Hx   . Rectal cancer Neg Hx   . Stomach cancer Neg Hx     Past Surgical History:  Procedure Laterality Date  . APPENDECTOMY    . COLONOSCOPY    . ENTEROSCOPY  08/24/2011   Procedure:  ENTEROSCOPY;  Surgeon: Inda Castle, MD;  Location: [facility];  Service: Endoscopy;  Laterality: N/A;  . HERNIA REPAIR     rt ing  . MASS EXCISION Right 09/01/2013   Procedure: EXCISION AXILLA  MASS;  Surgeon: Joyice Faster. Cornett, MD;  Location: [facility];  Service: General;  Laterality: Right;  . TOTAL HIP ARTHROPLASTY Left 12/25/2013   Procedure: LEFT TOTAL HIP ARTHROPLASTY ANTERIOR APPROACH;  Surgeon: Mcarthur Rossetti, MD;  Location: [facility];  Service: Orthopedics;  Laterality: Left;   Social History   Occupational History  . Occupation: Retired     Fish farm manager: RETIRED  Tobacco Use  . Smoking status: Former Smoker    Quit date: 08/27/1992    Years since quitting: 27.3  . Smokeless tobacco: Never Used  Substance and Sexual Activity  . Alcohol use: Yes    Alcohol/week: 0.0 standard drinks    Comment: Gin and beer every other day  . Drug use: No  . Sexual activity: Not on file

## 2020-05-03 ENCOUNTER — Ambulatory Visit: Payer: Medicare Other | Admitting: Orthopaedic Surgery

## 2020-05-03 DIAGNOSIS — M25551 Pain in right hip: Secondary | ICD-10-CM | POA: Diagnosis not present

## 2020-05-03 NOTE — Progress Notes (Signed)
The patient is well-known to me.  We have replaced his left hip before several years ago.  He then developed right hip pain.  He is an active 79 year old gentleman.  He saw Dr. Junius Roads back in March of this year and Dr. Junius Roads was able to provide an ultrasound-guided steroid injection in his right hip.  This helped him greatly up until about a week ago.  He is now having groin pain and having to use a cane to offload the hip to ambulate.  I talked about his hip in length in detail.  He still wants to hold off on any hip replacement surgery and would like to try 1 more injection in that right hip.  Since it has been coming up on 5 months since the last injection I do feel this is worth trying.  He has had no other acute changes in medical status recently.  On examination his right hip does have some pain in the groin with internal and external rotation.  There is no blocks to rotation but is become a little bit more stiff to him.  It is painful with this arc of rotation.  I gave him information to give the front desk in terms of getting him on Dr. Junius Roads to schedule in the next week or 2 to have an ultrasound-guided steroid injection in his right hip.  If his pain is getting acutely worse, I would appreciate if Dr. Junius Roads section can at least get a standing low AP pelvis to assess his right hip radiographically since we did not today.  Dr. Junius Roads can then get him back to me about a month after that injection.  All questions and concerns were answered and addressed.

## 2020-05-10 ENCOUNTER — Encounter: Payer: Self-pay | Admitting: Family Medicine

## 2020-05-10 ENCOUNTER — Ambulatory Visit: Payer: Self-pay

## 2020-05-10 ENCOUNTER — Ambulatory Visit (INDEPENDENT_AMBULATORY_CARE_PROVIDER_SITE_OTHER): Payer: Medicare Other | Admitting: Family Medicine

## 2020-05-10 ENCOUNTER — Other Ambulatory Visit: Payer: Self-pay

## 2020-05-10 DIAGNOSIS — M25551 Pain in right hip: Secondary | ICD-10-CM

## 2020-05-10 NOTE — Progress Notes (Signed)
Subjective: Patient is here for ultrasound-guided intra-articular right hip injection.   Prior greater troch injection helped a while, but now having pain in the groin area.  Feels like the hip will give way when bearing weight.  Objective:  Minimal GT tenderness.  Lots of pain with flexion and IR.  Procedure: Ultrasound-guided right hip injection: After sterile prep with Betadine, injected 8 cc 1% lidocaine without epinephrine and 40 mg methylprednisolone using a 22-gauge spinal needle, passing the needle through the iliofemoral ligament into the femoral head/neck junction.  Injectate seen filling joint capsule.  Good immediate relief.

## 2020-07-12 ENCOUNTER — Encounter: Payer: Self-pay | Admitting: Orthopaedic Surgery

## 2020-07-12 ENCOUNTER — Ambulatory Visit: Payer: Medicare Other | Admitting: Orthopaedic Surgery

## 2020-07-12 DIAGNOSIS — M87051 Idiopathic aseptic necrosis of right femur: Secondary | ICD-10-CM

## 2020-07-12 MED ORDER — HYDROCODONE-ACETAMINOPHEN 5-325 MG PO TABS: 1.0000 | ORAL_TABLET | Freq: Four times a day (QID) | ORAL | 0 refills | Status: DC | PRN

## 2020-07-12 NOTE — Progress Notes (Signed)
The patient is well-known to me.  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  He ambulates with a cane.  At this point his right hip pain is daily and it is detrimentally affected his quality of life, his activities of daily living and his mobility.  We did replace his left hip successfully through direct anterior approach in 2015.  This was also for AVN.  He has tried an intra-articular steroid injection recently.  That did not really help his pain.  At this point he wishes to proceed with hip replacement on the right hip.  He has had no other acute change in medical status.  Having had the surgery before he is fully aware of the risk and benefits of surgery.  We talked about his interoperative and postoperative course and what to expect.  He is otherwise healthy individual.  He denies any headache, chest pain, shortness of breath, fever, chills, nausea, vomiting.  His left operative hip moves well.  His right hip does have good motion but severe pain in the groin with internal and external rotation.  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  At this point we will work on getting him scheduled for a right total hip arthroplasty which at this point is medically warranted given his AVN and continued pain.  He will offload his hip with a cane which he already does use.  He will avoid high impact aerobic activities.  We will work on getting the surgery scheduled hopefully in the near future and then see him back appropriately postoperative.  All questions and concerns were answered and addressed.

## 2020-08-09 ENCOUNTER — Other Ambulatory Visit: Payer: Self-pay

## 2020-08-23 ENCOUNTER — Other Ambulatory Visit: Payer: Self-pay | Admitting: Physician Assistant

## 2020-08-26 NOTE — Patient Instructions (Addendum)
DUE TO COVID-19 ONLY ONE VISITOR IS ALLOWED TO COME WITH YOU AND STAY IN THE WAITING ROOM ONLY DURING PRE OP AND PROCEDURE DAY OF SURGERY. THE 1 VISITOR  MAY VISIT WITH YOU AFTER SURGERY IN YOUR PRIVATE ROOM DURING VISITING HOURS ONLY!  YOU NEED TO HAVE A COVID 19 TEST ON_12/14______ @__9 :40_____, THIS TEST MUST BE DONE BEFORE SURGERY,  COVID TESTING SITE [place] [address], IT IS ON THE RIGHT GOING OUT [address] APPROXIMATELY  2 MINUTES PAST [employer] ON THE RIGHT. ONCE YOUR COVID TEST IS COMPLETED,  PLEASE BEGIN THE QUARANTINE INSTRUCTIONS AS OUTLINED IN YOUR HANDOUT.                [name]   Your procedure is scheduled on: 09/02/20   Report to [facility] Main  Entrance   Report to admitting at   7:15 AM     Call this number if you have problems the morning of surgery [place], NO CHEWING GUM [place].   No food after midnight.    You may have clear liquid until 6:30 AM.    At 6:00 AM drink pre surgery drink  . Nothing by mouth after 6:30 AM.    Take these medicines the morning of surgery with A SIP OF WATER: Atenolol, Allopurinol, Nexium                                 You may not have any metal on your body including               piercings  Do not wear jewelry,  lotions, powders or deodorant                       Men may shave face and neck.   Do not bring valuables to the hospital. [place].  Contacts, dentures or bridgework may not be worn into surgery.         Name and phone number of your driver:  Special Instructions: N/A              Please read over the following fact sheets you were given: _____________________________________________________________________             [facility] - Preparing for Surgery Before surgery, you can play an important role.   Because skin is  not sterile, your skin needs to be as free of germs as possible.  You can reduce the number of germs on your skin by washing with CHG (chlorahexidine gluconate) soap before surgery.   CHG is an antiseptic cleaner which kills germs and bonds with the skin to continue killing germs even after washing. Please DO NOT use if you have an allergy to CHG or antibacterial soaps.   If your skin becomes reddened/irritated stop using the CHG and inform your nurse when you arrive at Short Stay.   You may shave your face/neck.  Please follow these instructions carefully:  1.  Shower with CHG Soap the night before surgery and the  morning of Surgery.  2.  If you choose to wash your hair, wash your hair first as usual with your  normal  shampoo.  3.  After you shampoo, rinse your hair and body thoroughly to remove the  shampoo.                                        4.  Use CHG as you would any other liquid soap.  You can apply chg directly  to the skin and wash                       Gently with a scrungie or clean washcloth.  5.  Apply the CHG Soap to your body ONLY FROM THE NECK DOWN.   Do not use on face/ open                           Wound or open sores. Avoid contact with eyes, ears mouth and genitals (private parts).                       Wash face,  Genitals (private parts) with your normal soap.             6.  Wash thoroughly, paying special attention to the area where your surgery  will be performed.  7.  Thoroughly rinse your body with warm water from the neck down.  8.  DO NOT shower/wash with your normal soap after using and rinsing off  the CHG Soap.             9.  Pat yourself dry with a clean towel.            10.  Wear clean pajamas.            11.  Place clean sheets on your bed the night of your first shower and do not  sleep with pets. Day of Surgery : Do not apply any lotions/deodorants the morning of surgery.  Please wear clean clothes to the hospital/surgery center.  FAILURE TO  FOLLOW THESE INSTRUCTIONS MAY RESULT IN THE CANCELLATION OF YOUR SURGERY PATIENT SIGNATURE_________________________________  NURSE SIGNATURE__________________________________  ________________________________________________________________________

## 2020-08-29 ENCOUNTER — Other Ambulatory Visit: Payer: Self-pay

## 2020-08-29 ENCOUNTER — Encounter (HOSPITAL_COMMUNITY)
Admission: RE | Admit: 2020-08-29 | Discharge: 2020-08-29 | Disposition: A | Discharge status: Home / Self Care | Payer: Medicare Other | Source: Ambulatory Visit | Attending: Orthopaedic Surgery | Admitting: Orthopaedic Surgery

## 2020-08-29 ENCOUNTER — Encounter (HOSPITAL_COMMUNITY): Payer: Self-pay

## 2020-08-29 DIAGNOSIS — Z01818 Encounter for other preprocedural examination: Secondary | ICD-10-CM | POA: Insufficient documentation

## 2020-08-29 DIAGNOSIS — I10 Essential (primary) hypertension: Secondary | ICD-10-CM | POA: Diagnosis not present

## 2020-08-29 LAB — CBC
HCT: 42.7 % (ref 39.0–52.0)
Hemoglobin: 13.9 g/dL (ref 13.0–17.0)
MCH: 28.6 pg (ref 26.0–34.0)
MCHC: 32.6 g/dL (ref 30.0–36.0)
MCV: 87.9 fL (ref 80.0–100.0)
Platelets: 173 10*3/uL (ref 150–400)
RBC: 4.86 MIL/uL (ref 4.22–5.81)
RDW: 14.9 % (ref 11.5–15.5)
WBC: 3.9 10*3/uL — ABNORMAL LOW (ref 4.0–10.5)
nRBC: 0 % (ref 0.0–0.2)

## 2020-08-29 LAB — SURGICAL PCR SCREEN
MRSA, PCR: NEGATIVE
Staphylococcus aureus: NEGATIVE

## 2020-08-29 NOTE — Progress Notes (Signed)
COVID Vaccine Completed:Yes Date COVID Vaccine completed:pt not sure . April 2021, Booster Oct 2021 COVID vaccine manufacturer:  Moderna     PCP - Dr. Alfonso Patten. [place] Cardiologist - none  Chest x-ray - no EKG - 08/29/20 chart , Epic Stress Test - no ECHO - no Cardiac Cath -no  Pacemaker/ICD device last checked:NA  Sleep Study - no CPAP -   Fasting Blood Sugar - NA Checks Blood Sugar _____ times a day  Blood Thinner Instructions:NA Aspirin Instructions: Last Dose:  Anesthesia review:   Patient denies shortness of breath, fever, cough and chest pain at PAT appointment yes   Patient verbalized understanding of instructions that were given to them at the PAT appointment. Patient was also instructed that they will need to review over the PAT instructions again at home before surgery. Pt  Can climb 1 flight of stair ans ADLs without SOB but he does get winded mowing the lawn. Pt saw a cardiologist in 2019  For a HR of 104. His PCP had him continue his Atenolol for BP and HR.

## 2020-08-30 ENCOUNTER — Other Ambulatory Visit (HOSPITAL_COMMUNITY)
Admission: RE | Admit: 2020-08-30 | Discharge: 2020-08-30 | Disposition: A | Discharge status: Home / Self Care | Payer: Medicare Other | Source: Ambulatory Visit | Attending: Orthopaedic Surgery | Admitting: Orthopaedic Surgery

## 2020-08-30 DIAGNOSIS — Z01812 Encounter for preprocedural laboratory examination: Secondary | ICD-10-CM | POA: Insufficient documentation

## 2020-08-30 DIAGNOSIS — Z20822 Contact with and (suspected) exposure to covid-19: Secondary | ICD-10-CM | POA: Insufficient documentation

## 2020-08-30 LAB — SARS CORONAVIRUS 2 (TAT 6-24 HRS): SARS Coronavirus 2: NEGATIVE

## 2020-09-01 NOTE — H&P (Signed)
TOTAL HIP ADMISSION H&P  Patient is admitted for right total hip arthroplasty.  Subjective:  Chief Complaint: right hip pain  HPI: [name], 79 y.o. male, has a history of pain and functional disability in the right hip(s) due to avascular necrosis and patient has failed non-surgical conservative treatments for greater than 12 weeks to include NSAID's and/or analgesics, corticosteriod injections, flexibility and strengthening excercises, use of assistive devices and activity modification.  Onset of symptoms was gradual starting 3 years ago with gradually worsening course since that time.The patient noted no past surgery on the right hip(s).  Patient currently rates pain in the right hip at 10 out of 10 with activity. Patient has night pain, worsening of pain with activity and weight bearing, trendelenberg gait, pain that interfers with activities of daily living and pain with passive range of motion. Patient has evidence of subchondral cysts, periarticular osteophytes and joint space narrowing by imaging studies. This condition presents safety issues increasing the risk of falls.  There is no current active infection.  Patient Active Problem List   Diagnosis Date Noted  . Avascular necrosis of bone of right hip ([place]) 07/12/2020  . Rotator cuff syndrome of left shoulder 05/16/2017  . MGUS (monoclonal gammopathy of unknown significance) 01/10/2016  . Avascular necrosis of bone of left hip ([place]) 12/25/2013  . 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 History of colonic polyps 02/17/2009  . 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 Iron deficiency anemia 10/25/2008   Past Medical History:  Diagnosis Date  . Abnormal CT of the chest 04/16/13   . Anemia    sees dr Griffin Basil OF IRON DEFICIENCY ANEMIA FROM INTERMITTENT GI BLOOD LOSS RELATED TO SMALL BOWEL AVMS  . Avascular necrosis ([place])    LEFT HIP - PT STATES HE WALKS WITH LIMP AND PAIN AT TIMES  . AVM (arteriovenous  malformation) of colon    HX OF GI BLOOD LOSS RELATED TO SMALL BOWEL AVM'S  . GERD (gastroesophageal reflux disease)   . Gout   . Hyperlipemia   . Hypertension   . IgG lambda monoclonal gammopathy    "OF UNDETERMINED SIGNIFICANCE" - FOLLOWED BY DR. Beryle Beams  . Lumbar pain    HX O PAIN LEFT LEG AND XRAYS BACK IN 2005 OR 2006 AND TOLD DISC RUBBING ON NERVE - GIVEN INJECTIONS LOWER BACK - DIDN'T SOLVE THE PAIN DOWN LEFT LEG  . Lymphadenopathy, axillary 04/07/2013   3 cm right axillary node S/P BIOPSY - NOT MALIGNANT  . MGUS (monoclonal gammopathy of unknown significance) 01/10/2016  . Sickle cell trait ([place])   . Wears glasses   . Wears partial dentures    top and bottom partials    Past Surgical History:  Procedure Laterality Date  . APPENDECTOMY    . COLONOSCOPY    . ENTEROSCOPY  08/24/2011   Procedure: ENTEROSCOPY;  Surgeon: Inda Castle, MD;  Location: [facility];  Service: Endoscopy;  Laterality: N/A;  . HERNIA REPAIR     rt ing  . MASS EXCISION Right 09/01/2013   Procedure: EXCISION AXILLA  MASS;  Surgeon: Joyice Faster. Cornett, MD;  Location: [facility];  Service: General;  Laterality: Right;  . TOTAL HIP ARTHROPLASTY Left 12/25/2013   Procedure: LEFT TOTAL HIP ARTHROPLASTY ANTERIOR APPROACH;  Surgeon: Mcarthur Rossetti, MD;  Location: [facility];  Service: Orthopedics;  Laterality: Left;    No current facility-administered medications for this encounter.   Current Outpatient Medications  Medication Sig Dispense Refill Last  Dose  . allopurinol (ZYLOPRIM) 100 MG tablet Take 100 mg by mouth daily.      Marland Kitchen atenolol (TENORMIN) 25 MG tablet Take 25 mg by mouth every morning.      Marland Kitchen atorvastatin (LIPITOR) 20 MG tablet Take 20 mg by mouth at bedtime.      . Cinnamon 500 MG capsule Take 500 mg by mouth daily.       Marland Kitchen esomeprazole (NEXIUM) 20 MG capsule Take 20 mg by mouth daily as needed (Heartburn).      . ferrous sulfate 325 (65 FE) MG tablet Take 325 mg by mouth 2  (two) times daily with a meal.     . Multiple Vitamin (MULTIVITAMIN WITH MINERALS) TABS tablet Take 1 tablet by mouth daily. Centrum Silver     . naproxen sodium (ALEVE) 220 MG tablet Take 220 mg by mouth daily as needed (Pain).     . vitamin C (ASCORBIC ACID) 500 MG tablet Take 500 mg by mouth daily.     . Zinc Acetate, Oral, (ZINC ACETATE PO) Take 1 tablet by mouth daily.      Marland Kitchen HYDROcodone-acetaminophen (NORCO) 5-325 MG tablet Take 1 tablet by mouth every 6 (six) hours as needed for moderate pain. (Patient not taking: Reported on 08/23/2020) 30 tablet 0 Not Taking at Unknown time   No Known Allergies  Social History   Tobacco Use  . Smoking status: Former Smoker    Quit date: 08/27/1992    Years since quitting: 28.0  . Smokeless tobacco: Never Used  Substance Use Topics  . Alcohol use: Yes    Alcohol/week: 0.0 standard drinks    Comment: Gin and beer every other day    Family History  Problem Relation Age of Onset  . Colon cancer Neg Hx   . Esophageal cancer Neg Hx   . Rectal cancer Neg Hx   . Stomach cancer Neg Hx      Review of Systems  Musculoskeletal: Positive for gait problem.  All other systems reviewed and are negative.   Objective:  Physical Exam Vitals reviewed.  Constitutional:      Appearance: Normal appearance.  HENT:     Head: Normocephalic and atraumatic.  Eyes:     Extraocular Movements: Extraocular movements intact.     Pupils: Pupils are equal, round, and reactive to light.  Cardiovascular:     Rate and Rhythm: Normal rate.     Pulses: Normal pulses.  Pulmonary:     Effort: Pulmonary effort is normal.  Abdominal:     Palpations: Abdomen is soft.  Musculoskeletal:     Cervical back: Normal range of motion and neck supple.     Right hip: Tenderness and bony tenderness present. Decreased range of motion. Decreased strength.  Neurological:     Mental Status: He is alert and oriented to person, place, and time.  Psychiatric:        Behavior:  Behavior normal.     Vital signs in last 24 hours:    Labs:   Estimated body mass index is 28.25 kg/m as calculated from the following:   Height as of 08/29/20: 6\' 2"  (1.88 m).   Weight as of 08/29/20: 99.8 kg.   Imaging Review Plain radiographs demonstrate severe avascular necrosis of the left hip(s). The bone quality appears to be good for age and reported activity level.      Assessment/Plan:  End avascular necrosis, right hip(s)  The patient history, physical examination, clinical judgement of the provider  and imaging studies are consistent with end stage AVN of the right hip(s) and total hip arthroplasty is deemed medically necessary. The treatment options including medical management, injection therapy, arthroscopy and arthroplasty were discussed at length. The risks and benefits of total hip arthroplasty were presented and reviewed. The risks due to aseptic loosening, infection, stiffness, dislocation/subluxation,  thromboembolic complications and other imponderables were discussed.  The patient acknowledged the explanation, agreed to proceed with the plan and consent was signed. Patient is being admitted for inpatient treatment for surgery, pain control, PT, OT, prophylactic antibiotics, VTE prophylaxis, progressive ambulation and ADL's and discharge planning.The patient is planning to be discharged home with home health services

## 2020-09-02 ENCOUNTER — Observation Stay (HOSPITAL_COMMUNITY): Payer: Medicare Other

## 2020-09-02 ENCOUNTER — Other Ambulatory Visit: Payer: Self-pay

## 2020-09-02 ENCOUNTER — Ambulatory Visit (HOSPITAL_COMMUNITY): Payer: Medicare Other | Admitting: Registered Nurse

## 2020-09-02 ENCOUNTER — Observation Stay (HOSPITAL_COMMUNITY)
Admission: RE | Admit: 2020-09-02 | Discharge: 2020-09-04 | Disposition: A | Discharge status: Home / Self Care | Payer: Medicare Other | Source: Ambulatory Visit | Attending: Orthopaedic Surgery | Admitting: Orthopaedic Surgery

## 2020-09-02 ENCOUNTER — Encounter (HOSPITAL_COMMUNITY)
Admission: RE | Disposition: A | Discharge status: Home / Self Care | Payer: Self-pay | Source: Ambulatory Visit | Attending: Orthopaedic Surgery

## 2020-09-02 ENCOUNTER — Encounter (HOSPITAL_COMMUNITY): Payer: Self-pay | Admitting: Orthopaedic Surgery

## 2020-09-02 ENCOUNTER — Ambulatory Visit (HOSPITAL_COMMUNITY): Payer: Medicare Other

## 2020-09-02 DIAGNOSIS — M87051 Idiopathic aseptic necrosis of right femur: Principal | ICD-10-CM | POA: Diagnosis present

## 2020-09-02 DIAGNOSIS — Z87891 Personal history of nicotine dependence: Secondary | ICD-10-CM | POA: Diagnosis not present

## 2020-09-02 DIAGNOSIS — I10 Essential (primary) hypertension: Secondary | ICD-10-CM | POA: Diagnosis not present

## 2020-09-02 DIAGNOSIS — Z419 Encounter for procedure for purposes other than remedying health state, unspecified: Secondary | ICD-10-CM

## 2020-09-02 DIAGNOSIS — Z96642 Presence of left artificial hip joint: Secondary | ICD-10-CM | POA: Insufficient documentation

## 2020-09-02 DIAGNOSIS — Z79899 Other long term (current) drug therapy: Secondary | ICD-10-CM | POA: Insufficient documentation

## 2020-09-02 DIAGNOSIS — Z96641 Presence of right artificial hip joint: Secondary | ICD-10-CM

## 2020-09-02 DIAGNOSIS — M25551 Pain in right hip: Secondary | ICD-10-CM | POA: Diagnosis present

## 2020-09-02 HISTORY — PX: TOTAL HIP ARTHROPLASTY: SHX124

## 2020-09-02 LAB — TYPE AND SCREEN
ABO/RH(D): O POS
Antibody Screen: NEGATIVE

## 2020-09-02 SURGERY — ARTHROPLASTY, HIP, TOTAL, ANTERIOR APPROACH
Anesthesia: Spinal | Site: Hip | Laterality: Right

## 2020-09-02 MED ORDER — OXYCODONE HCL 5 MG PO TABS: 5.0000 mg | ORAL_TABLET | Freq: Once | ORAL | Status: DC | PRN

## 2020-09-02 MED ORDER — ALUM & MAG HYDROXIDE-SIMETH 200-200-20 MG/5ML PO SUSP: 30.0000 mL | ORAL | Status: DC | PRN

## 2020-09-02 MED ORDER — ATORVASTATIN CALCIUM 20 MG PO TABS
20.0000 mg | ORAL_TABLET | Freq: Every day | ORAL | Status: DC
  Administered 2020-09-02 – 2020-09-03 (×2): 20 mg via ORAL
  Filled 2020-09-02 (×2): qty 1

## 2020-09-02 MED ORDER — FENTANYL CITRATE (PF) 100 MCG/2ML IJ SOLN: 25.0000 ug | INTRAMUSCULAR | Status: DC | PRN

## 2020-09-02 MED ORDER — OXYCODONE HCL 5 MG/5ML PO SOLN: 5.0000 mg | Freq: Once | ORAL | Status: DC | PRN

## 2020-09-02 MED ORDER — PROPOFOL 1000 MG/100ML IV EMUL
INTRAVENOUS | Status: AC
  Filled 2020-09-02: qty 100

## 2020-09-02 MED ORDER — ONDANSETRON HCL 4 MG PO TABS: 4.0000 mg | ORAL_TABLET | Freq: Four times a day (QID) | ORAL | Status: DC | PRN

## 2020-09-02 MED ORDER — ACETAMINOPHEN 325 MG PO TABS
325.0000 mg | ORAL_TABLET | Freq: Four times a day (QID) | ORAL | Status: DC | PRN
  Administered 2020-09-03 – 2020-09-04 (×2): 650 mg via ORAL
  Filled 2020-09-02 (×2): qty 2

## 2020-09-02 MED ORDER — ONDANSETRON HCL 4 MG/2ML IJ SOLN: 4.0000 mg | Freq: Four times a day (QID) | INTRAMUSCULAR | Status: DC | PRN

## 2020-09-02 MED ORDER — PHENYLEPHRINE 40 MCG/ML (10ML) SYRINGE FOR IV PUSH (FOR BLOOD PRESSURE SUPPORT)
PREFILLED_SYRINGE | INTRAVENOUS | Status: DC | PRN
  Administered 2020-09-02: 80 ug via INTRAVENOUS
  Administered 2020-09-02: 40 ug via INTRAVENOUS
  Administered 2020-09-02: 80 ug via INTRAVENOUS

## 2020-09-02 MED ORDER — LACTATED RINGERS IV SOLN: INTRAVENOUS | Status: DC

## 2020-09-02 MED ORDER — SODIUM CHLORIDE 0.9 % IR SOLN
Status: DC | PRN
  Administered 2020-09-02: 1000 mL

## 2020-09-02 MED ORDER — FENTANYL CITRATE (PF) 100 MCG/2ML IJ SOLN
INTRAMUSCULAR | Status: DC | PRN
  Administered 2020-09-02: 50 ug via INTRAVENOUS

## 2020-09-02 MED ORDER — ALLOPURINOL 100 MG PO TABS
100.0000 mg | ORAL_TABLET | Freq: Every day | ORAL | Status: DC
Start: 2020-09-03 — End: 2020-09-04
  Administered 2020-09-03 – 2020-09-04 (×2): 100 mg via ORAL
  Filled 2020-09-02 (×2): qty 1

## 2020-09-02 MED ORDER — ATENOLOL 25 MG PO TABS
25.0000 mg | ORAL_TABLET | Freq: Every morning | ORAL | Status: DC
  Administered 2020-09-03 – 2020-09-04 (×2): 25 mg via ORAL
  Filled 2020-09-02 (×2): qty 1

## 2020-09-02 MED ORDER — METOCLOPRAMIDE HCL 5 MG PO TABS: 5.0000 mg | ORAL_TABLET | Freq: Three times a day (TID) | ORAL | Status: DC | PRN

## 2020-09-02 MED ORDER — OXYCODONE HCL 5 MG PO TABS
5.0000 mg | ORAL_TABLET | ORAL | Status: DC | PRN
  Administered 2020-09-02: 5 mg via ORAL
  Administered 2020-09-02 – 2020-09-04 (×4): 10 mg via ORAL
  Filled 2020-09-02 (×4): qty 2
  Filled 2020-09-02: qty 1

## 2020-09-02 MED ORDER — AMISULPRIDE (ANTIEMETIC) 5 MG/2ML IV SOLN: 10.0000 mg | Freq: Once | INTRAVENOUS | Status: DC | PRN

## 2020-09-02 MED ORDER — CEFAZOLIN SODIUM-DEXTROSE 1-4 GM/50ML-% IV SOLN
1.0000 g | Freq: Four times a day (QID) | INTRAVENOUS | Status: AC
  Administered 2020-09-02 (×2): 1 g via INTRAVENOUS
  Filled 2020-09-02 (×2): qty 50

## 2020-09-02 MED ORDER — HYDROMORPHONE HCL 1 MG/ML IJ SOLN: 0.5000 mg | INTRAMUSCULAR | Status: DC | PRN

## 2020-09-02 MED ORDER — 0.9 % SODIUM CHLORIDE (POUR BTL) OPTIME
TOPICAL | Status: DC | PRN
  Administered 2020-09-02: 11:00:00 1000 mL

## 2020-09-02 MED ORDER — ASCORBIC ACID 500 MG PO TABS
500.0000 mg | ORAL_TABLET | Freq: Every day | ORAL | Status: DC
  Administered 2020-09-03 – 2020-09-04 (×2): 500 mg via ORAL
  Filled 2020-09-02 (×2): qty 1

## 2020-09-02 MED ORDER — ONDANSETRON HCL 4 MG/2ML IJ SOLN
INTRAMUSCULAR | Status: DC | PRN
  Administered 2020-09-02: 4 mg via INTRAVENOUS

## 2020-09-02 MED ORDER — PHENYLEPHRINE HCL-NACL 20-0.9 MG/250ML-% IV SOLN
INTRAVENOUS | Status: DC | PRN
  Administered 2020-09-02: 25 ug/min via INTRAVENOUS

## 2020-09-02 MED ORDER — PROPOFOL 500 MG/50ML IV EMUL
INTRAVENOUS | Status: DC | PRN
  Administered 2020-09-02: 40 ug/kg/min via INTRAVENOUS

## 2020-09-02 MED ORDER — ZINC ACETATE 50 MG PO CAPS: ORAL_CAPSULE | Freq: Every day | ORAL | Status: DC

## 2020-09-02 MED ORDER — DEXAMETHASONE SODIUM PHOSPHATE 10 MG/ML IJ SOLN
INTRAMUSCULAR | Status: DC | PRN
  Administered 2020-09-02: 8 mg via INTRAVENOUS

## 2020-09-02 MED ORDER — PHENOL 1.4 % MT LIQD: 1.0000 | OROMUCOSAL | Status: DC | PRN

## 2020-09-02 MED ORDER — ONDANSETRON HCL 4 MG/2ML IJ SOLN
INTRAMUSCULAR | Status: AC
  Filled 2020-09-02: qty 2

## 2020-09-02 MED ORDER — PANTOPRAZOLE SODIUM 40 MG PO TBEC
40.0000 mg | DELAYED_RELEASE_TABLET | Freq: Every day | ORAL | Status: DC
  Administered 2020-09-03 – 2020-09-04 (×2): 40 mg via ORAL
  Filled 2020-09-02 (×2): qty 1

## 2020-09-02 MED ORDER — TRANEXAMIC ACID-NACL 1000-0.7 MG/100ML-% IV SOLN
1000.0000 mg | INTRAVENOUS | Status: AC
  Administered 2020-09-02: 11:00:00 1000 mg via INTRAVENOUS
  Filled 2020-09-02: qty 100

## 2020-09-02 MED ORDER — GABAPENTIN 100 MG PO CAPS
100.0000 mg | ORAL_CAPSULE | Freq: Three times a day (TID) | ORAL | Status: DC
  Administered 2020-09-02 – 2020-09-04 (×6): 100 mg via ORAL
  Filled 2020-09-02 (×6): qty 1

## 2020-09-02 MED ORDER — MIDAZOLAM HCL 2 MG/2ML IJ SOLN
INTRAMUSCULAR | Status: AC
  Filled 2020-09-02: qty 2

## 2020-09-02 MED ORDER — ONDANSETRON HCL 4 MG/2ML IJ SOLN: 4.0000 mg | Freq: Once | INTRAMUSCULAR | Status: DC | PRN

## 2020-09-02 MED ORDER — ASPIRIN 81 MG PO CHEW
81.0000 mg | CHEWABLE_TABLET | Freq: Two times a day (BID) | ORAL | Status: DC
  Administered 2020-09-02 – 2020-09-04 (×4): 81 mg via ORAL
  Filled 2020-09-02 (×4): qty 1

## 2020-09-02 MED ORDER — FERROUS SULFATE 325 (65 FE) MG PO TABS
325.0000 mg | ORAL_TABLET | Freq: Two times a day (BID) | ORAL | Status: DC
  Administered 2020-09-02 – 2020-09-04 (×4): 325 mg via ORAL
  Filled 2020-09-02 (×4): qty 1

## 2020-09-02 MED ORDER — ADULT MULTIVITAMIN W/MINERALS CH
1.0000 | ORAL_TABLET | Freq: Every day | ORAL | Status: DC
  Administered 2020-09-03 – 2020-09-04 (×2): 1 via ORAL
  Filled 2020-09-02 (×2): qty 1

## 2020-09-02 MED ORDER — ORAL CARE MOUTH RINSE: 15.0000 mL | Freq: Once | OROMUCOSAL | Status: AC

## 2020-09-02 MED ORDER — FENTANYL CITRATE (PF) 100 MCG/2ML IJ SOLN
INTRAMUSCULAR | Status: AC
  Filled 2020-09-02: qty 2

## 2020-09-02 MED ORDER — METOCLOPRAMIDE HCL 5 MG/ML IJ SOLN: 5.0000 mg | Freq: Three times a day (TID) | INTRAMUSCULAR | Status: DC | PRN

## 2020-09-02 MED ORDER — DOCUSATE SODIUM 100 MG PO CAPS
100.0000 mg | ORAL_CAPSULE | Freq: Two times a day (BID) | ORAL | Status: DC
  Administered 2020-09-02 – 2020-09-04 (×4): 100 mg via ORAL
  Filled 2020-09-02 (×4): qty 1

## 2020-09-02 MED ORDER — MENTHOL 3 MG MT LOZG: 1.0000 | LOZENGE | OROMUCOSAL | Status: DC | PRN

## 2020-09-02 MED ORDER — POVIDONE-IODINE 10 % EX SWAB
2.0000 "application " | Freq: Once | CUTANEOUS | Status: AC
  Administered 2020-09-02: 2 via TOPICAL

## 2020-09-02 MED ORDER — STERILE WATER FOR IRRIGATION IR SOLN
Status: DC | PRN
  Administered 2020-09-02: 2000 mL

## 2020-09-02 MED ORDER — DEXAMETHASONE SODIUM PHOSPHATE 10 MG/ML IJ SOLN
INTRAMUSCULAR | Status: AC
  Filled 2020-09-02: qty 1

## 2020-09-02 MED ORDER — BUPIVACAINE IN DEXTROSE 0.75-8.25 % IT SOLN
INTRATHECAL | Status: DC | PRN
  Administered 2020-09-02: 2 mL via INTRATHECAL

## 2020-09-02 MED ORDER — PHENYLEPHRINE 40 MCG/ML (10ML) SYRINGE FOR IV PUSH (FOR BLOOD PRESSURE SUPPORT)
PREFILLED_SYRINGE | INTRAVENOUS | Status: AC
  Filled 2020-09-02: qty 10

## 2020-09-02 MED ORDER — MIDAZOLAM HCL 5 MG/5ML IJ SOLN
INTRAMUSCULAR | Status: DC | PRN
  Administered 2020-09-02: 1 mg via INTRAVENOUS

## 2020-09-02 MED ORDER — SODIUM CHLORIDE 0.9 % IV SOLN: INTRAVENOUS | Status: DC

## 2020-09-02 MED ORDER — OXYCODONE HCL 5 MG PO TABS
10.0000 mg | ORAL_TABLET | ORAL | Status: DC | PRN
Start: 2020-09-02 — End: 2020-09-04
  Administered 2020-09-03: 10 mg via ORAL
  Filled 2020-09-02: qty 2

## 2020-09-02 MED ORDER — CHLORHEXIDINE GLUCONATE 0.12 % MT SOLN
15.0000 mL | Freq: Once | OROMUCOSAL | Status: AC
  Administered 2020-09-02: 08:00:00 15 mL via OROMUCOSAL

## 2020-09-02 MED ORDER — PHENYLEPHRINE HCL (PRESSORS) 10 MG/ML IV SOLN
INTRAVENOUS | Status: AC
  Filled 2020-09-02: qty 2

## 2020-09-02 MED ORDER — CEFAZOLIN SODIUM-DEXTROSE 2-4 GM/100ML-% IV SOLN
2.0000 g | INTRAVENOUS | Status: AC
  Administered 2020-09-02: 11:00:00 2 g via INTRAVENOUS
  Filled 2020-09-02: qty 100

## 2020-09-02 SURGICAL SUPPLY — 42 items
ACETAB CUP W GRIPTION 54MM (Plate) ×1 IMPLANT
ACETAB CUP W/GRIPTION 54 (Plate) ×2 IMPLANT
BAG ZIPLOCK 12X15 (MISCELLANEOUS) IMPLANT
BENZOIN TINCTURE PRP APPL 2/3 (GAUZE/BANDAGES/DRESSINGS) IMPLANT
BLADE SAW SGTL 18X1.27X75 (BLADE) ×2 IMPLANT
BLADE SAW SGTL 18X1.27X75MM (BLADE) ×1
CLOSURE WOUND 1/2 X4 (GAUZE/BANDAGES/DRESSINGS)
COVER PERINEAL POST (MISCELLANEOUS) ×3 IMPLANT
COVER SURGICAL LIGHT HANDLE (MISCELLANEOUS) ×3 IMPLANT
COVER WAND RF STERILE (DRAPES) ×3 IMPLANT
CUP ACETAB W/GRIPTION 54 (Plate) ×1 IMPLANT
DRAPE STERI IOBAN 125X83 (DRAPES) ×3 IMPLANT
DRAPE U-SHAPE 47X51 STRL (DRAPES) ×6 IMPLANT
DRSG AQUACEL AG ADV 3.5X10 (GAUZE/BANDAGES/DRESSINGS) ×3 IMPLANT
DURAPREP 26ML APPLICATOR (WOUND CARE) ×3 IMPLANT
ELECT REM PT RETURN 15FT ADLT (MISCELLANEOUS) ×3 IMPLANT
GAUZE XEROFORM 1X8 LF (GAUZE/BANDAGES/DRESSINGS) ×3 IMPLANT
GLOVE BIO SURGEON STRL SZ7.5 (GLOVE) ×3 IMPLANT
GLOVE BIOGEL PI IND STRL 8 (GLOVE) ×2 IMPLANT
GLOVE BIOGEL PI INDICATOR 8 (GLOVE) ×4
GLOVE ECLIPSE 8.0 STRL XLNG CF (GLOVE) ×3 IMPLANT
GOWN STRL REUS W/TWL XL LVL3 (GOWN DISPOSABLE) ×6 IMPLANT
HANDPIECE INTERPULSE COAX TIP (DISPOSABLE) ×2
HEAD M SROM 36MM PLUS 1.5 (Hips) ×1 IMPLANT
HOLDER FOLEY CATH W/STRAP (MISCELLANEOUS) ×3 IMPLANT
KIT TURNOVER KIT A (KITS) IMPLANT
LINER NEUTRAL 54X36MM PLUS 4 (Hips) ×3 IMPLANT
PACK ANTERIOR HIP CUSTOM (KITS) ×3 IMPLANT
PENCIL SMOKE EVACUATOR (MISCELLANEOUS) IMPLANT
SET HNDPC FAN SPRY TIP SCT (DISPOSABLE) ×1 IMPLANT
SROM M HEAD 36MM PLUS 1.5 (Hips) ×3 IMPLANT
STAPLER VISISTAT 35W (STAPLE) IMPLANT
STEM CORAIL KA13 (Stem) ×3 IMPLANT
STRIP CLOSURE SKIN 1/2X4 (GAUZE/BANDAGES/DRESSINGS) IMPLANT
SUT ETHIBOND NAB CT1 #1 30IN (SUTURE) ×3 IMPLANT
SUT ETHILON 2 0 PS N (SUTURE) IMPLANT
SUT MNCRL AB 4-0 PS2 18 (SUTURE) IMPLANT
SUT VIC AB 0 CT1 36 (SUTURE) ×3 IMPLANT
SUT VIC AB 1 CT1 36 (SUTURE) ×3 IMPLANT
SUT VIC AB 2-0 CT1 27 (SUTURE) ×4
SUT VIC AB 2-0 CT1 TAPERPNT 27 (SUTURE) ×2 IMPLANT
TRAY FOLEY MTR SLVR 16FR STAT (SET/KITS/TRAYS/PACK) IMPLANT

## 2020-09-02 NOTE — Transfer of Care (Signed)
Immediate Anesthesia Transfer of Care Note  Patient: [name]  Procedure(s) Performed: RIGHT TOTAL HIP ARTHROPLASTY ANTERIOR APPROACH (Right Hip)  Patient Location: PACU  Anesthesia Type:MAC and Spinal  Level of Consciousness: awake, alert , oriented and patient cooperative  Airway & Oxygen Therapy: Patient Spontanous Breathing and Patient connected to face mask oxygen  Post-op Assessment: Report given to RN and Post -op Vital signs reviewed and stable  Post vital signs: Reviewed and stable  Last Vitals:  Vitals Value Taken Time  BP 103/62 09/02/20 1152  Temp    Pulse 73 09/02/20 1154  Resp 22 09/02/20 1154  SpO2 100 % 09/02/20 1154  Vitals shown include unvalidated device data.  Last Pain:  Vitals:   09/02/20 0806  TempSrc: Oral  PainSc:       Patients Stated Pain Goal: 4 (12/50/87 1994)  Complications: No complications documented.

## 2020-09-02 NOTE — Brief Op Note (Signed)
09/02/2020  11:35 AM  PATIENT:  [name]  79 y.o. male  PRE-OPERATIVE DIAGNOSIS:  Avascular Necrosis Right Hip  POST-OPERATIVE DIAGNOSIS:  Avascular Necrosis Right Hip  PROCEDURE:  Procedure(s): RIGHT TOTAL HIP ARTHROPLASTY ANTERIOR APPROACH (Right)  SURGEON:  Surgeon(s) and Role:    Mcarthur Rossetti, MD - Primary  PHYSICIAN ASSISTANT: Benita Stabile, PA-c  ANESTHESIA:   spinal  EBL:  150 mL   COUNTS:  YES  DICTATION: .Other Dictation: Dictation Number [id number]  PLAN OF CARE: Admit for overnight observation  PATIENT DISPOSITION:  PACU - hemodynamically stable.   Delay start of Pharmacological VTE agent (>24hrs) due to surgical blood loss or risk of bleeding: no

## 2020-09-02 NOTE — Anesthesia Procedure Notes (Signed)
Spinal  Patient location during procedure: OR Staffing Performed: anesthesiologist  Anesthesiologist: Mykah Bellomo E, MD Preanesthetic Checklist Completed: patient identified, IV checked, risks and benefits discussed, surgical consent, monitors and equipment checked, pre-op evaluation and timeout performed Spinal Block Patient position: sitting Prep: DuraPrep and site prepped and draped Patient monitoring: continuous pulse ox, blood pressure and heart rate Approach: midline Location: L3-4 Injection technique: single-shot Needle Needle type: Pencan  Needle gauge: 24 G Needle length: 9 cm Additional Notes Functioning IV was confirmed and monitors were applied. Sterile prep and drape, including hand hygiene and sterile gloves were used. The patient was positioned and the spine was prepped. The skin was anesthetized with lidocaine.  Free flow of clear CSF was obtained prior to injecting local anesthetic into the CSF. The needle was carefully withdrawn. The patient tolerated the procedure well.      

## 2020-09-02 NOTE — Anesthesia Preprocedure Evaluation (Signed)
Anesthesia Evaluation  Patient identified by MRN, date of birth, ID band Patient awake    Reviewed: Allergy & Precautions, NPO status , Patient's Chart, lab work & pertinent test results, reviewed documented beta blocker date and time   History of Anesthesia Complications Negative for: history of anesthetic complications  Airway Mallampati: II  TM Distance: >3 FB Neck ROM: Full    Dental  (+) Partial Lower, Partial Upper   Pulmonary neg pulmonary ROS, former smoker,    Pulmonary exam normal        Cardiovascular hypertension, Pt. on medications and Pt. on home beta blockers Normal cardiovascular exam     Neuro/Psych negative neurological ROS  negative psych ROS   GI/Hepatic Neg liver ROS, GERD  Medicated,Small bowel AVMs   Endo/Other  negative endocrine ROS  Renal/GU negative Renal ROS  negative genitourinary   Musculoskeletal  (+) Arthritis ,   Abdominal   Peds  Hematology negative hematology ROS (+)   Anesthesia Other Findings  MGUS  Reproductive/Obstetrics                            Anesthesia Physical Anesthesia Plan  ASA: II  Anesthesia Plan: Spinal   Post-op Pain Management:    Induction:   PONV Risk Score and Plan: 1 and Propofol infusion, Treatment may vary due to age or medical condition, Ondansetron and TIVA  Airway Management Planned: Nasal Cannula and Simple Face Mask  Additional Equipment: None  Intra-op Plan:   Post-operative Plan:   Informed Consent: I have reviewed the patients History and Physical, chart, labs and discussed the procedure including the risks, benefits and alternatives for the proposed anesthesia with the patient or authorized representative who has indicated his/her understanding and acceptance.       Plan Discussed with:   Anesthesia Plan Comments:         Anesthesia Quick Evaluation

## 2020-09-02 NOTE — Anesthesia Postprocedure Evaluation (Signed)
Anesthesia Post Note  Patient: [name]  Procedure(s) Performed: RIGHT TOTAL HIP ARTHROPLASTY ANTERIOR APPROACH (Right Hip)     Patient location during evaluation: PACU Anesthesia Type: Spinal Level of consciousness: oriented and awake and alert Pain management: pain level controlled Vital Signs Assessment: post-procedure vital signs reviewed and stable Respiratory status: spontaneous breathing, respiratory function stable and nonlabored ventilation Cardiovascular status: blood pressure returned to baseline and stable Postop Assessment: no headache, no backache, no apparent nausea or vomiting and spinal receding Anesthetic complications: no   No complications documented.  Last Vitals:  Vitals:   09/02/20 1501 09/02/20 1553  BP: (!) 97/59 (!) 95/52  Pulse: 87 91  Resp: 17 18  Temp: 36.4 C 36.7 C  SpO2: 95% 96%    Last Pain:  Vitals:   09/02/20 1553  TempSrc: Oral  PainSc:                  Lidia Collum

## 2020-09-02 NOTE — Interval H&P Note (Signed)
History and Physical Interval Note: The patient is here today for a scheduled right total hip arthroplasty to treat his right hip avascular necrosis.  He has had no interval change in his medical status.  See recent H&P.  The risk and benefits of surgery been explained in detail and informed consent has been obtained.  The right hip is been marked.  09/02/2020 8:48 AM  Rande Brunt  has presented today for surgery, with the diagnosis of Avascular Necrosis Right Hip.  The various methods of treatment have been discussed with the patient and family. After consideration of risks, benefits and other options for treatment, the patient has consented to  Procedure(s): RIGHT TOTAL HIP ARTHROPLASTY ANTERIOR APPROACH (Right) as a surgical intervention.  The patient's history has been reviewed, patient examined, no change in status, stable for surgery.  I have reviewed the patient's chart and labs.  Questions were answered to the patient's satisfaction.     Mcarthur Rossetti

## 2020-09-02 NOTE — Evaluation (Signed)
Physical Therapy Evaluation Patient Details Name: [name] MRN: [id number] DOB: 1941-01-07 Today's Date: 09/02/2020   History of Present Illness  Patient is 79 y.o. male s/p RT THA anterior appraoch on 09/02/20 with PMH significant for sickle cell trait, HTN, HLD, GERD, gout, Lt THA in 2015.    Clinical Impression  Americus Perkey is a 79 y.o. male POD 0 s/p Rt THA. Patient reports independence with mobility at baseline. Patient is now limited by functional impairments (see PT problem list below) and requires min assist for transfers and gait with RW. Patient was able to ambulate ~30 feet with RW and min assist. Distance limited by c/o sweaty sensation and pt provided seated rest. Symptoms resolved. Patient instructed in exercise to facilitate ROM and circulation. Patient will benefit from continued skilled PT interventions to address impairments and progress towards PLOF. Acute PT will follow to progress mobility and stair training in preparation for safe discharge home.     Follow Up Recommendations Follow surgeon's recommendation for DC plan and follow-up therapies;Home health PT    Equipment Recommendations  None recommended by PT    Recommendations for Other Services       Precautions / Restrictions Precautions Precautions: Fall Restrictions Weight Bearing Restrictions: No Other Position/Activity Restrictions: WBAT      Mobility  Bed Mobility Overal bed mobility: Needs Assistance Bed Mobility: Supine to Sit     Supine to sit: Min assist;HOB elevated     General bed mobility comments: cues to use bed rail, light assist for Lt LE to mobilize to EOB.    Transfers Overall transfer level: Needs assistance Equipment used: Rolling walker (2 wheeled) Transfers: Sit to/from Stand Sit to Stand: Min assist;From elevated surface         General transfer comment: Cues for technique with RW, assist for power up and to steady with rising.  Ambulation/Gait Ambulation/Gait  assistance: Min assist Gait Distance (Feet): 30 Feet Assistive device: Rolling walker (2 wheeled) Gait Pattern/deviations: Step-to pattern;Decreased stride length Gait velocity: decr   General Gait Details: VC's for step pattern in RW and proximity. assist to manage walker with turns. pt limited by c/o sweaty sensation and seated rest provided.  Stairs            Wheelchair Mobility    Modified Rankin (Stroke Patients Only)       Balance Overall balance assessment: Needs assistance Sitting-balance support: Feet supported Sitting balance-Leahy Scale: Good     Standing balance support: During functional activity;Bilateral upper extremity supported Standing balance-Leahy Scale: Fair                               Pertinent Vitals/Pain Pain Assessment: 0-10 Pain Score: 3  Pain Location: Rt hip Pain Descriptors / Indicators: Aching Pain Intervention(s): Limited activity within patient's tolerance;Monitored during session;Repositioned    Home Living Family/patient expects to be discharged to:: Private residence Living Arrangements: Spouse/significant other Available Help at Discharge: Family Type of Home: House Home Access: Stairs to enter Entrance Stairs-Rails: Right Entrance Stairs-Number of Steps: 3 Home Layout: One level Home Equipment: Cane - single point;Grab bars - tub/shower;Walker - 2 wheels;Bedside commode      Prior Function Level of Independence: Independent               Hand Dominance   Dominant Hand: Right    Extremity/Trunk Assessment   Upper Extremity Assessment Upper Extremity Assessment: Overall WFL for tasks assessed  Lower Extremity Assessment Lower Extremity Assessment: Overall WFL for tasks assessed    Cervical / Trunk Assessment Cervical / Trunk Assessment: Normal  Communication   Communication: No difficulties  Cognition Arousal/Alertness: Awake/alert Behavior During Therapy: WFL for tasks  assessed/performed Overall Cognitive Status: Within Functional Limits for tasks assessed                                        General Comments      Exercises Total Joint Exercises Ankle Circles/Pumps: AROM;Both;20 reps;Seated Quad Sets: AROM;Right;5 reps;Seated Heel Slides: AROM;Right;5 reps;Seated   Assessment/Plan    PT Assessment Patient needs continued PT services  PT Problem List Decreased strength;Decreased range of motion;Decreased activity tolerance;Decreased balance;Decreased mobility;Decreased knowledge of use of DME;Pain       PT Treatment Interventions DME instruction;Gait training;Stair training;Functional mobility training;Therapeutic exercise;Therapeutic activities;Balance training;Patient/family education    PT Goals (Current goals can be found in the Care Plan section)  Acute Rehab PT Goals Patient Stated Goal: recover independence PT Goal Formulation: With patient Time For Goal Achievement: 09/02/20 Potential to Achieve Goals: Good    Frequency 7X/week   Barriers to discharge        Co-evaluation               AM-PAC PT "6 Clicks" Mobility  Outcome Measure Help needed turning from your back to your side while in a flat bed without using bedrails?: None Help needed moving from lying on your back to sitting on the side of a flat bed without using bedrails?: A Little Help needed moving to and from a bed to a chair (including a wheelchair)?: A Little Help needed standing up from a chair using your arms (e.g., wheelchair or bedside chair)?: A Little Help needed to walk in hospital room?: A Little Help needed climbing 3-5 steps with a railing? : A Little 6 Click Score: 19    End of Session Equipment Utilized During Treatment: Gait belt Activity Tolerance: Patient tolerated treatment well Patient left: in chair;with call bell/phone within reach;with chair alarm set Nurse Communication: Mobility status PT Visit Diagnosis: Muscle  weakness (generalized) (M62.81);Difficulty in walking, not elsewhere classified (R26.2);Pain Pain - Right/Left: Right Pain - part of body: Hip    Time: 1610-9604 PT Time Calculation (min) (ACUTE ONLY): 29 min   Charges:   PT Evaluation $PT Eval Low Complexity: 1 Low PT Treatments $Gait Training: 8-22 mins        Verner Mould, DPT Acute Rehabilitation Services Office [phone] Pager [phone]    Jacques Navy 09/02/2020, 4:33 PM

## 2020-09-03 DIAGNOSIS — M87051 Idiopathic aseptic necrosis of right femur: Secondary | ICD-10-CM | POA: Diagnosis not present

## 2020-09-03 LAB — CBC
HCT: 34.2 % — ABNORMAL LOW (ref 39.0–52.0)
Hemoglobin: 11.2 g/dL — ABNORMAL LOW (ref 13.0–17.0)
MCH: 28.8 pg (ref 26.0–34.0)
MCHC: 32.7 g/dL (ref 30.0–36.0)
MCV: 87.9 fL (ref 80.0–100.0)
Platelets: 141 10*3/uL — ABNORMAL LOW (ref 150–400)
RBC: 3.89 MIL/uL — ABNORMAL LOW (ref 4.22–5.81)
RDW: 14.8 % (ref 11.5–15.5)
WBC: 8.3 10*3/uL (ref 4.0–10.5)
nRBC: 0 % (ref 0.0–0.2)

## 2020-09-03 LAB — BASIC METABOLIC PANEL
Anion gap: 11 (ref 5–15)
BUN: 14 mg/dL (ref 8–23)
CO2: 21 mmol/L — ABNORMAL LOW (ref 22–32)
Calcium: 8.3 mg/dL — ABNORMAL LOW (ref 8.9–10.3)
Chloride: 107 mmol/L (ref 98–111)
Creatinine, Ser: 1.14 mg/dL (ref 0.61–1.24)
GFR, Estimated: >60 mL/min (ref >60)
Glucose, Bld: 119 mg/dL — ABNORMAL HIGH (ref 70–99)
Potassium: 4.3 mmol/L (ref 3.5–5.1)
Sodium: 139 mmol/L (ref 135–145)

## 2020-09-03 MED ORDER — OXYCODONE HCL 5 MG PO TABS: 5.0000 mg | ORAL_TABLET | ORAL | 0 refills | Status: DC | PRN

## 2020-09-03 MED ORDER — ASPIRIN 81 MG PO CHEW: 81.0000 mg | CHEWABLE_TABLET | Freq: Two times a day (BID) | ORAL | 0 refills | Status: DC

## 2020-09-03 NOTE — Plan of Care (Signed)
  Problem: Activity: Goal: Ability to avoid complications of mobility impairment will improve Outcome: Progressing   Problem: Pain Management: Goal: Pain level will decrease with appropriate interventions Outcome: Progressing   Problem: Activity: Goal: Risk for activity intolerance will decrease Outcome: Progressing   Problem: Safety: Goal: Ability to remain free from injury will improve Outcome: Progressing   Problem: Skin Integrity: Goal: Risk for impaired skin integrity will decrease Outcome: Progressing   Problem: Pain Managment: Goal: General experience of comfort will improve Outcome: Progressing

## 2020-09-03 NOTE — Progress Notes (Signed)
Pt stable though c/o more pain in right hip near groin area. Pt c/o more weakness in his right leg with this pain as well with less mobility. Pt states he feels like he needs to stay overnight tonight. rn will notify md on call.

## 2020-09-03 NOTE — Discharge Summary (Signed)
Patient ID: [name] MRN: [id number] DOB/AGE: 1941/01/25 79 y.o.  Admit date: 09/02/2020 Discharge date: 09/03/2020  Admission Diagnoses:  Principal Problem:   Avascular necrosis of bone of right hip ([place]) Active Problems:   Status post total hip replacement, right   Discharge Diagnoses:  Same  Past Medical History:  Diagnosis Date  . Abnormal CT of the chest 04/16/13   . Anemia    sees dr Griffin Basil OF IRON DEFICIENCY ANEMIA FROM INTERMITTENT GI BLOOD LOSS RELATED TO SMALL BOWEL AVMS  . Avascular necrosis ([place])    LEFT HIP - PT STATES HE WALKS WITH LIMP AND PAIN AT TIMES  . AVM (arteriovenous malformation) of colon    HX OF GI BLOOD LOSS RELATED TO SMALL BOWEL AVM'S  . GERD (gastroesophageal reflux disease)   . Gout   . Hyperlipemia   . Hypertension   . IgG lambda monoclonal gammopathy    "OF UNDETERMINED SIGNIFICANCE" - FOLLOWED BY DR. Beryle Beams  . Lumbar pain    HX O PAIN LEFT LEG AND XRAYS BACK IN 2005 OR 2006 AND TOLD DISC RUBBING ON NERVE - GIVEN INJECTIONS LOWER BACK - DIDN'T SOLVE THE PAIN DOWN LEFT LEG  . Lymphadenopathy, axillary 04/07/2013   3 cm right axillary node S/P BIOPSY - NOT MALIGNANT  . MGUS (monoclonal gammopathy of unknown significance) 01/10/2016  . Sickle cell trait ([place])   . Wears glasses   . Wears partial dentures    top and bottom partials    Surgeries: Procedure(s): RIGHT TOTAL HIP ARTHROPLASTY ANTERIOR APPROACH on 09/02/2020   Consultants:   Discharged Condition: Improved  Hospital Course: [name] is an 79 y.o. male who was admitted 09/02/2020 for operative treatment ofAvascular necrosis of bone of right hip ([place]). Patient has severe unremitting pain that affects sleep, daily activities, and work/hobbies. After pre-op clearance the patient was taken to the operating room on 09/02/2020 and underwent  Procedure(s): RIGHT TOTAL HIP ARTHROPLASTY ANTERIOR APPROACH.    Patient was given perioperative antibiotics:  Anti-infectives  (From admission, onward)   Start     Dose/Rate Route Frequency Ordered Stop   09/02/20 1630  ceFAZolin (ANCEF) IVPB 1 g/50 mL premix        1 g 100 mL/hr over 30 Minutes Intravenous Every 6 hours 09/02/20 1340 09/02/20 2200   09/02/20 0730  ceFAZolin (ANCEF) IVPB 2g/100 mL premix        2 g 200 mL/hr over 30 Minutes Intravenous On call to O.R. 09/02/20 0716 09/02/20 1105       Patient was given sequential compression devices, early ambulation, and chemoprophylaxis to prevent DVT.  Patient benefited maximally from hospital stay and there were no complications.    Recent vital signs:  Patient Vitals for the past 24 hrs:  BP Temp Temp src Pulse Resp SpO2  09/03/20 0929 110/63 98.8 F (37.1 C) Oral 99 16 95 %  09/03/20 0555 124/74 98.2 F (36.8 C) Oral 80 16 98 %  09/03/20 0210 124/71 98.2 F (36.8 C) Oral 77 16 98 %  09/02/20 2029 114/65 98.7 F (37.1 C) Oral 80 16 98 %  09/02/20 1700 105/66 98.5 F (36.9 C) Oral 90 18 98 %  09/02/20 1553 (!) 95/52 98.1 F (36.7 C) Oral 91 18 96 %  09/02/20 1501 (!) 97/59 97.6 F (36.4 C) Oral 87 17 95 %  09/02/20 1347 110/77 98.1 F (36.7 C) Oral 67 15 99 %  09/02/20 1300 110/66 -- -- 67 18 100 %  09/02/20 1245  112/89 (!) 97.5 F (36.4 C) -- 64 14 99 %  09/02/20 1230 112/64 (!) 97 F (36.1 C) -- 67 15 98 %  09/02/20 1215 103/70 (!) 97 F (36.1 C) -- 67 18 100 %  09/02/20 1200 99/64 (!) 96.9 F (36.1 C) -- 72 16 96 %  09/02/20 1152 103/62 (!) 96.9 F (36.1 C) -- 75 12 100 %     Recent laboratory studies:  Recent Labs    09/03/20 0248  WBC 8.3  HGB 11.2*  HCT 34.2*  PLT 141*  NA 139  K 4.3  CL 107  CO2 21*  BUN 14  CREATININE 1.14  GLUCOSE 119*  CALCIUM 8.3*     Discharge Medications:   Allergies as of 09/03/2020   No Known Allergies     Medication List    TAKE these medications   allopurinol 100 MG tablet Commonly known as: ZYLOPRIM Take 100 mg by mouth daily.   aspirin 81 MG chewable tablet Chew 1 tablet  (81 mg total) by mouth 2 (two) times daily.   atenolol 25 MG tablet Commonly known as: TENORMIN Take 25 mg by mouth every morning.   atorvastatin 20 MG tablet Commonly known as: LIPITOR Take 20 mg by mouth at bedtime.   Cinnamon 500 MG capsule Take 500 mg by mouth daily.   esomeprazole 20 MG capsule Commonly known as: NEXIUM Take 20 mg by mouth daily as needed (Heartburn).   ferrous sulfate 325 (65 FE) MG tablet Take 325 mg by mouth 2 (two) times daily with a meal.   multivitamin with minerals Tabs tablet Take 1 tablet by mouth daily. Centrum Silver   naproxen sodium 220 MG tablet Commonly known as: ALEVE Take 220 mg by mouth daily as needed (Pain).   oxyCODONE 5 MG immediate release tablet Commonly known as: Oxy IR/ROXICODONE Take 1 tablet (5 mg total) by mouth every 4 (four) hours as needed for moderate pain (pain score 4-6).   vitamin C 500 MG tablet Commonly known as: ASCORBIC ACID Take 500 mg by mouth daily.   ZINC ACETATE PO Take 1 tablet by mouth daily.            Durable Medical Equipment  (From admission, onward)         Start     Ordered   09/02/20 1341  DME 3 n 1  Once        09/02/20 1340   09/02/20 1341  DME Walker rolling  Once       Question Answer Comment  Walker: With 5 Inch Wheels   Patient needs a walker to treat with the following condition Status post total replacement of right hip      09/02/20 1340          Diagnostic Studies: DG Pelvis Portable  Result Date: 09/02/2020 CLINICAL DATA:  Status post right hip replacement EXAM: PORTABLE PELVIS 1-2 VIEWS COMPARISON:  Intraop films from earlier in the same day FINDINGS: Bilateral hip replacements are now seen. No acute bony or soft tissue abnormality is seen. IMPRESSION: Status post right hip replacement. Electronically Signed   By: Inez Catalina M.D.   On: 09/02/2020 13:12   DG C-Arm 1-60 Min-No Report  Result Date: 09/02/2020 Fluoroscopy was utilized by the requesting physician.   No radiographic interpretation.   DG HIP OPERATIVE UNILAT W OR W/O PELVIS RIGHT  Result Date: 09/02/2020 CLINICAL DATA:  Right hip replacement EXAM: OPERATIVE RIGHT HIP (WITH PELVIS IF PERFORMED) 1 VIEW  TECHNIQUE: Fluoroscopic spot image(s) were submitted for interpretation post-operatively. COMPARISON:  None. FINDINGS: Intraoperative radiographs demonstrate right hip osteoarthritis with subsequent right total hip arthroplasty. No evidence of complication. Partially imaged prior left total hip arthroplasty. IMPRESSION: Post right total hip arthroplasty. Electronically Signed   By: Macy Mis M.D.   On: 09/02/2020 12:13    Disposition: Discharge disposition: 01-Home or [place]    Mcarthur Rossetti, MD Follow up in 2 week(s).   Specialty: Orthopedic Surgery Contact information: [address] [place] [address] [phone]                Signed: Mcarthur Rossetti 09/03/2020, 11:27 AM

## 2020-09-03 NOTE — TOC Initial Note (Signed)
Transition of Care [facility]) - Initial/Assessment Note    Patient Details  Name: [name] MRN: [id number] Date of Birth: 11/04/1940  [facility]) CM/SW Contact:    Joaquin Courts, RN Phone Number: 09/03/2020, 11:52 AM  Clinical Narrative:                 CM spoke with patient, [facility] to provide HHPT.  [employer] to provide rolling walker, patient reports he has 3in1 at home.  Expected Discharge Plan: [place] Barriers to Discharge: No Barriers Identified   Patient Goals and CMS Choice Patient states their goals for this hospitalization and ongoing recovery are:: go home with home health physical therapy CMS Medicare.gov Compare Post Acute Care list provided to:: Patient Choice offered to / list presented to : Patient  Expected Discharge Plan and Services Expected Discharge Plan: [place]   Discharge Planning Services: CM Consult Post Acute Care Choice: [place] arrangements for the past 2 months: Single Family Home Expected Discharge Date: 09/03/20               DME Arranged: Gilford Rile rolling DME Agency: [employer] Date DME Agency Contacted: 09/03/20 Time DME Agency Contacted: 23 Representative spoke with at DME Agency: [place]: PT [place]: [facility] [facility] [employer]     Representative spoke with at [place]: pre-arranged by md office  Prior Living Arrangements/Services Living arrangements for the past 2 months: [place]   Patient language and need for interpreter reviewed:: Yes Do you feel safe going back to the place where you live?: Yes      Need for Family Participation in Patient Care: Yes (Comment) Care giver support system in place?: Yes (comment)   Criminal Activity/Legal Involvement Pertinent to Current Situation/Hospitalization: No - Comment as needed  Activities of Daily Living Home Assistive Devices/Equipment: Dentures (specify  type),Eyeglasses,Bedside commode/3-in-1 ADL Screening (condition at time of admission) Patient's cognitive ability adequate to safely complete daily activities?: Yes Is the patient deaf or have difficulty hearing?: Yes Does the patient have difficulty seeing, even when wearing glasses/contacts?: No Does the patient have difficulty concentrating, remembering, or making decisions?: No Patient able to express need for assistance with ADLs?: Yes Does the patient have difficulty dressing or bathing?: No Independently performs ADLs?: Yes (appropriate for developmental age) Does the patient have difficulty walking or climbing stairs?: No Weakness of Legs: None Weakness of Arms/Hands: None  Permission Sought/Granted                  Emotional Assessment Appearance:: Appears stated age Attitude/Demeanor/Rapport: Engaged Affect (typically observed): Accepting Orientation: : Oriented to Self,Oriented to Place,Oriented to  Time,Oriented to Situation   Psych Involvement: No (comment)  Admission diagnosis:  Status post total hip replacement, right [H41.937] Patient Active Problem List   Diagnosis Date Noted  . Status post total hip replacement, right 09/02/2020  . Avascular necrosis of bone of right hip ([place]) 07/12/2020  . Rotator cuff syndrome of left shoulder 05/16/2017  . MGUS (monoclonal gammopathy of unknown significance) 01/10/2016  . Avascular necrosis of bone of left hip ([place]) 12/25/2013  . 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 History of colonic polyps 02/17/2009  . 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 Iron deficiency anemia 10/25/2008   PCP:  Haywood Pao, MD Pharmacy:   [facility] [place] [address] [place] ([place])  [place] [address] Phone: [phone] Fax: [phone]     Social Determinants of Health (SDOH) Interventions    Readmission Risk Interventions No flowsheet data  found.

## 2020-09-03 NOTE — Progress Notes (Signed)
Subjective: 1 Day Post-Op Procedure(s) (LRB): RIGHT TOTAL HIP ARTHROPLASTY ANTERIOR APPROACH (Right) Patient reports pain as moderate.    Objective: Vital signs in last 24 hours: Temp:  [96.9 F (36.1 C)-98.8 F (37.1 C)] 98.8 F (37.1 C) (12/18 0929) Pulse Rate:  [64-99] 99 (12/18 0929) Resp:  [12-18] 16 (12/18 0929) BP: (95-124)/(52-89) 110/63 (12/18 0929) SpO2:  [95 %-100 %] 95 % (12/18 0929)  Intake/Output from previous day: 12/17 0701 - 12/18 0700 In: 3408 [P.O.:990; I.V.:2119.3; IV Piggyback:298.8] Out: 2050 [Urine:1900; Blood:150] Intake/Output this shift: Total I/O In: 590.6 [P.O.:480; I.V.:110.6] Out: -   Recent Labs    09/03/20 0248  HGB 11.2*   Recent Labs    09/03/20 0248  WBC 8.3  RBC 3.89*  HCT 34.2*  PLT 141*   Recent Labs    09/03/20 0248  NA 139  K 4.3  CL 107  CO2 21*  BUN 14  CREATININE 1.14  GLUCOSE 119*  CALCIUM 8.3*   No results for input(s): LABPT, INR in the last 72 hours.  Sensation intact distally Intact pulses distally Dorsiflexion/Plantar flexion intact Incision: scant drainage   Assessment/Plan: 1 Day Post-Op Procedure(s) (LRB): RIGHT TOTAL HIP ARTHROPLASTY ANTERIOR APPROACH (Right) Up with therapy Discharge home with home health    Patient's anticipated LOS is less than 2 midnights, meeting these requirements: - Younger than 44 - Lives within 1 hour of care - Has a competent adult at home to recover with post-op recover - NO history of  - Chronic pain requiring opiods  - Diabetes  - Coronary Artery Disease  - Heart failure  - Heart attack  - Stroke  - DVT/VTE  - Cardiac arrhythmia  - Respiratory Failure/COPD  - Renal failure  - Anemia  - Advanced Liver disease       Mcarthur Rossetti 09/03/2020, 11:24 AM

## 2020-09-03 NOTE — Progress Notes (Signed)
Physical Therapy Treatment Patient Details Name: [name] MRN: [id number] DOB: [birth year] Today's Date: 09/03/2020    History of Present Illness Patient is 79 y.o. male s/p RT THA anterior appraoch on 09/02/20 with PMH significant for sickle cell trait, HTN, HLD, GERD, gout, Lt THA in 2015.    PT Comments    At start of session, pt c/o increased R hip/groin pain. Pt stated he felt increased pain after elevating the leg rest of the recliner just prior to PT arriving. He was agreeable to 2nd session of PT. Reviewed/practiced gait training and stair training. Pt continued to c/o increased pain during session. Requested pain meds from RN. Instructed pt to update RN on his pain level after giving medicine time to take effect. Also instructed him to make her aware if he did not feel ready to d/c home so that she can update the MD. Will continue to follow and progress activity should pt remain in hospital overnight.    Follow Up Recommendations  Follow surgeon's recommendation for DC plan and follow-up therapies (plan if for HHPT)     Equipment Recommendations  Rolling walker with 5" wheels    Recommendations for Other Services       Precautions / Restrictions Precautions Precautions: Fall Restrictions Weight Bearing Restrictions: No Other Position/Activity Restrictions: WBAT    Mobility  Bed Mobility               General bed mobility comments: oob in recliner  Transfers Overall transfer level: Needs assistance Equipment used: Rolling walker (2 wheeled) Transfers: Sit to/from Stand Sit to Stand: Min assist         General transfer comment: Cues for safety, hand placement. Increased time. Assist to steady.  Ambulation/Gait Ambulation/Gait assistance: Min guard Gait Distance (Feet): 60 Feet Assistive device: Rolling walker (2 wheeled) Gait Pattern/deviations: Step-to pattern;Step-through pattern;Decreased stride length     General Gait Details: Cues for safety,  sequencing, RW proximity. Min guard for safety. Slow gait speed. Some difficulty advancing R LE.   Stairs Stairs: Yes Stairs assistance: Min assist Stair Management: Sideways;One rail Right Number of Stairs: 2 General stair comments: up and over portable stairs x 1. vcs safety, technique, sequence. 2 hands on R hand rail. Task was effortful and painful for pt but he was able to complete it with assistance.   Wheelchair Mobility    Modified Rankin (Stroke Patients Only)       Balance Overall balance assessment: Needs assistance         Standing balance support: Bilateral upper extremity supported Standing balance-Leahy Scale: Poor                              Cognition Arousal/Alertness: Awake/alert Behavior During Therapy: WFL for tasks assessed/performed Overall Cognitive Status: Within Functional Limits for tasks assessed                                        Exercises Total Joint Exercises Marching in Standing: Right;5 reps;Standing    General Comments        Pertinent Vitals/Pain Pain Assessment: 0-10 Pain Score: 7  Pain Location: R hip with activity Pain Descriptors / Indicators: Tightness;Aching;Discomfort;Sore Pain Intervention(s): Limited activity within patient's tolerance;Monitored during session;Repositioned;Patient requesting pain meds-RN notified    Home Living  Prior Function            PT Goals (current goals can now be found in the care plan section) Progress towards PT goals: Progressing toward goals    Frequency    7X/week      PT Plan Current plan remains appropriate    Co-evaluation              AM-PAC PT "6 Clicks" Mobility   Outcome Measure  Help needed turning from your back to your side while in a flat bed without using bedrails?: A Little Help needed moving from lying on your back to sitting on the side of a flat bed without using bedrails?: A  Little Help needed moving to and from a bed to a chair (including a wheelchair)?: A Little Help needed standing up from a chair using your arms (e.g., wheelchair or bedside chair)?: A Little Help needed to walk in hospital room?: A Little Help needed climbing 3-5 steps with a railing? : A Little 6 Click Score: 18    End of Session Equipment Utilized During Treatment: Gait belt Activity Tolerance: Patient limited by pain Patient left: in chair;with call bell/phone within reach   PT Visit Diagnosis: Other abnormalities of gait and mobility (R26.89);Muscle weakness (generalized) (M62.81);Pain Pain - Right/Left: Right Pain - part of body: Hip     Time: 1007-1219 PT Time Calculation (min) (ACUTE ONLY): 12 min  Charges:  $Gait Training: 8-22 mins              Doreatha Massed, PT Acute Rehabilitation  Office: [phone] Pager: [phone]

## 2020-09-03 NOTE — Op Note (Signed)
[name], [name] MEDICAL RECORD [id number] ACCOUNT [id number] DATE OF BIRTH:28-Mar-1941 FACILITY: [facility] LOCATION: [facility] PHYSICIAN:Saleena Tamas Kerry Fort, MD  OPERATIVE REPORT  DATE OF PROCEDURE:  09/02/2020  PREOPERATIVE DIAGNOSIS:  End-stage avascular necrosis, right hip.  POSTOPERATIVE DIAGNOSIS:  End-stage avascular necrosis, right hip.  PROCEDURE:  Right total hip arthroplasty through direct anterior approach.  IMPLANTS:  DePuy Sector Gription acetabular component size 54, size 36+4 neutral polyethylene liner, size 13 Corail femoral component with standard offset, size 36+1.5 metal hip ball.  SURGEON:  Lind Guest. Ninfa Linden, MD  ASSISTANT:  Erskine Emery, PA-C.  ANESTHESIA:  Spinal.  ANTIBIOTICS:  Two grams IV Ancef.  ESTIMATED BLOOD LOSS:  150-200 mL.  COMPLICATIONS:  None.  INDICATIONS:  The patient is a 79 year old gentleman well known to me.  He actually has avascular necrosis of both his hips and several years ago we replaced his left hip.  His right hip has now become significantly more painful.  It is detrimentally  affecting his mobility, his quality of life and his activities of daily living.  Fortunately, he does not have femoral head collapse yet and we have recommended total hip arthroplasty on his right side just like he had on his left side.  Having had this  before he is fully aware of the risk of acute blood loss anemia, nerve or vessel injury, fracture, infection, dislocation, DVT and implant failure.  He understands our goals are to decrease pain, improve mobility and overall improve quality of life.  DESCRIPTION OF PROCEDURE:  After informed consent was obtained and appropriate right hip was marked.  He was brought to the operating room and sat up on a stretcher where spinal anesthesia was obtained.  He was laid in supine position on a stretcher.  I  was able to place traction boots on his feet and assess his leg lengths.  A Foley catheter was  placed.  Next, he was placed supine on the Hana fracture table, the perineal post in place and both legs in-line skeletal traction device and no traction  applied.  His right operative hip was prepped and draped with DuraPrep and sterile drapes.  A time-out was called.  He was identified as correct patient, correct right hip.  I then made an incision just inferior and posterior to the anterior superior  iliac spine and carried this obliquely down the leg.  I dissected down to the tensor fascia lata muscle.  The tensor fascia was then divided longitudinally to proceed with direct anterior approach to the hip.  We identified and cauterized the circumflex  vessels.  I then identified the hip capsule, opened up the hip capsule in an L-type format finding a moderate joint effusion to significant joint effusion.  We then placed Cobra retractors around the medial and lateral femoral neck within the joint  capsule and made our femoral neck cut with an oscillating saw and completed this with osteotome.  This was proximal to the lesser trochanter.  We then placed a corkscrew guide in the femoral head and removed the femoral head in its entirety.  The  cartilage was fibrinated and we could pull it off easily from avascular necrosis.  I then placed a bent Hohmann over the medial acetabular rim and removed remnants of the acetabular labrum and other debris.  I then began reaming under direct  visualization from a size 44 reamer in stepwise increments going up to a size 53 with all reamers under direct visualization, the last reamer placed under direct fluoroscopy,  so we could obtain our depth of reaming, our inclination and anteversion.  I  then placed the real DePuy Sector Gription acetabular component size 54 with a 36+4 neutral polyethylene liner.  Attention was then turned to the femur.  With the leg externally rotated to 120 degrees, extended and adducted, we replaced Mueller retractor  medially and Hohmann  retractor above the greater trochanter.  We released lateral joint capsule and used a box-cutting osteotome to enter the femoral canal and a rongeur to lateralize, then began broaching using the Corail broaching system going from a  size 8 going up to a size 13.  With the 13 in place, we trialed this 36+1.5 hip ball, reduced this in the acetabulum.  We were pleased with our leg length, offset and stability.  I tried to go with the shorter ball, but it was just not stable to my  liking.  I then dislocated the hip and removed the trial components.  We placed the real Corail femoral component size 13 with standard offset and the real 36+1.5 metal hip ball and again reduced this in the acetabulum and it was stable.  We then  irrigated the soft tissue with normal saline solution using pulsatile lavage.  We assessed his stability again mechanically and radiographically.  We then closed the joint capsule with interrupted #1 Ethibond suture, followed by closing the tensor fascia  with #1 Vicryl.  0 Vicryl was used to close the deep tissue and 2-0 Vicryl was used to close subcutaneous tissue.  The skin was reapproximated with staples.  An Aquacel dressing was applied.  He was taken off the Hana table and taken to recovery room in  stable condition with all final counts being correct.  No complications noted.  Of note, Benita Stabile, PA-C, assisted during the entire case and his assistance was crucial for facilitating all aspects of this case.  HN/NUANCE  D:09/02/2020 T:09/03/2020 JOB:[id number]

## 2020-09-03 NOTE — Progress Notes (Signed)
Physical Therapy Treatment Patient Details Name: [name] MRN: [id number] DOB: Feb 10, 1941 Today's Date: 09/03/2020    History of Present Illness Patient is 79 y.o. male s/p RT THA anterior appraoch on 09/02/20 with PMH significant for sickle cell trait, HTN, HLD, GERD, gout, Lt THA in 2015.    PT Comments    Progressing with mobility. Will plan to have a 2nd session today prior to possible d/c home if okay with MD.    Follow Up Recommendations  Follow surgeons recommendation for DC plan and follow-up therapies (plan is for HHPT)     Equipment Recommendations  Rolling walker with 5" wheels    Recommendations for Other Services       Precautions / Restrictions Precautions Precautions: Fall Restrictions Weight Bearing Restrictions: No Other Position/Activity Restrictions: WBAT    Mobility  Bed Mobility               General bed mobility comments: oob in recliner  Transfers Overall transfer level: Needs assistance Equipment used: Rolling walker (2 wheeled) Transfers: Sit to/from Stand Sit to Stand: Min guard         General transfer comment: Cues for safety, hand placement. Increased time.  Ambulation/Gait Ambulation/Gait assistance: Min guard Gait Distance (Feet): 75 Feet Assistive device: Rolling walker (2 wheeled) Gait Pattern/deviations: Step-to pattern;Step-through pattern;Decreased stride length     General Gait Details: Cues for safety, sequencing, RW proximity. Min guard for safety. Slow gait speed. Some difficulty advancing R LE.   Stairs             Wheelchair Mobility    Modified Rankin (Stroke Patients Only)       Balance Overall balance assessment: Needs assistance         Standing balance support: Bilateral upper extremity supported Standing balance-Leahy Scale: Fair                              Cognition Arousal/Alertness: Awake/alert Behavior During Therapy: WFL for tasks assessed/performed Overall  Cognitive Status: Within Functional Limits for tasks assessed                                        Exercises Total Joint Exercises Hip ABduction/ADduction: AROM;Right;10 reps;Standing Knee Flexion: AROM;Right;10 reps;Standing Marching in Standing: AROM;Both;10 reps;Standing General Exercises - Lower Extremity Heel Raises: AROM;Both;10 reps;Standing    General Comments        Pertinent Vitals/Pain Pain Assessment: 0-10 Pain Score: 5  Pain Location: R hip with activity Pain Descriptors / Indicators: Tightness;Aching;Discomfort;Sore Pain Intervention(s): Monitored during session;Repositioned;Ice applied    Home Living                      Prior Function            PT Goals (current goals can now be found in the care plan section) Progress towards PT goals: Progressing toward goals    Frequency    7X/week      PT Plan Current plan remains appropriate    Co-evaluation              AM-PAC PT "6 Clicks" Mobility   Outcome Measure  Help needed turning from your back to your side while in a flat bed without using bedrails?: A Little Help needed moving from lying on your back to sitting on the side of  a flat bed without using bedrails?: A Little Help needed moving to and from a bed to a chair (including a wheelchair)?: A Little Help needed standing up from a chair using your arms (e.g., wheelchair or bedside chair)?: A Little Help needed to walk in hospital room?: A Little Help needed climbing 3-5 steps with a railing? : A Little 6 Click Score: 18    End of Session Equipment Utilized During Treatment: Gait belt Activity Tolerance: Patient tolerated treatment well Patient left: in chair;with call bell/phone within reach   PT Visit Diagnosis: Other abnormalities of gait and mobility (R26.89);Muscle weakness (generalized) (M62.81);Pain Pain - Right/Left: Right Pain - part of body: Hip     Time: 0929-5747 PT Time Calculation (min)  (ACUTE ONLY): 15 min  Charges:  $Gait Training: 8-22 mins                         Doreatha Massed, PT Acute Rehabilitation  Office: [phone] Pager: [phone]

## 2020-09-03 NOTE — Progress Notes (Signed)
Dr. Lorin Mercy notified of pt groin pain and mobility concerns after groin injury. Pt did walk with physical therapy after groin injury and did well but reports increased weakness to rn. Rn will continue to monitor.

## 2020-09-03 NOTE — Discharge Instructions (Signed)

## 2020-09-04 DIAGNOSIS — M87051 Idiopathic aseptic necrosis of right femur: Secondary | ICD-10-CM | POA: Diagnosis not present

## 2020-09-04 NOTE — Progress Notes (Signed)
Physical Therapy Treatment Patient Details Name: [name] MRN: [id number] DOB: [birth year] Today's Date: 09/04/2020    History of Present Illness Patient is 79 y.o. male s/p RT THA anterior appraoch on 09/02/20 with PMH significant for sickle cell trait, HTN, HLD, GERD, gout, Lt THA in 2015.    PT Comments    Progressing with mobility. Pt reports feeling better today. Less pain in hip. Reviewed/practiced exercises, gait training and stair training. All education completed. Okay to d/c from PT standpoint.    Follow Up Recommendations  Follow surgeon's recommendation for DC plan and follow-up therapies (plan is for HHPT)     Equipment Recommendations  Rolling walker with 5" wheels    Recommendations for Other Services       Precautions / Restrictions Precautions Precautions: Fall Restrictions Weight Bearing Restrictions: No Other Position/Activity Restrictions: WBAT    Mobility  Bed Mobility Overal bed mobility: Needs Assistance Bed Mobility: Supine to Sit;Sit to Supine     Supine to sit: Min guard;HOB elevated Sit to supine: Min guard;HOB elevated   General bed mobility comments: Increased time. Task is effortful but pt is able to perform unassisted.  Transfers Overall transfer level: Needs assistance Equipment used: Rolling walker (2 wheeled) Transfers: Sit to/from Stand Sit to Stand: Min guard         General transfer comment: Cues for safety, hand placement. Increased time. Min guard for safety.  Ambulation/Gait Ambulation/Gait assistance: Min guard Gait Distance (Feet): 75 Feet Assistive device: Rolling walker (2 wheeled) Gait Pattern/deviations: Step-to pattern;Step-through pattern;Decreased stride length     General Gait Details: Cues for safety, sequencing, RW proximity. Min guard for safety. Slow gait speed. Some difficulty advancing R LE initially.   Stairs Stairs: Yes Stairs assistance: Min guard Stair Management: One rail Right;With  cane;Forwards;Step to pattern Number of Stairs: 2 General stair comments: up and over portable stairs x 1. vcs safety, technique, sequence. Cane on L, handrail on R.   Wheelchair Mobility    Modified Rankin (Stroke Patients Only)       Balance Overall balance assessment: Needs assistance         Standing balance support: Bilateral upper extremity supported Standing balance-Leahy Scale: Poor                              Cognition Arousal/Alertness: Awake/alert Behavior During Therapy: WFL for tasks assessed/performed Overall Cognitive Status: Within Functional Limits for tasks assessed                                        Exercises Total Joint Exercises Ankle Circles/Pumps: AROM;Both;10 reps;Supine Quad Sets: AROM;Both;10 reps;Supine Heel Slides: AAROM;Right;10 reps;Supine Hip ABduction/ADduction: AAROM;Right;10 reps;Supine    General Comments        Pertinent Vitals/Pain Pain Assessment: 0-10 Pain Score: 4  Pain Location: R hip with activity Pain Descriptors / Indicators: Tightness;Aching;Discomfort;Sore Pain Intervention(s): Monitored during session    Home Living                      Prior Function            PT Goals (current goals can now be found in the care plan section) Progress towards PT goals: Progressing toward goals    Frequency    7X/week      PT Plan Current plan remains appropriate  Co-evaluation              AM-PAC PT "6 Clicks" Mobility   Outcome Measure  Help needed turning from your back to your side while in a flat bed without using bedrails?: A Little Help needed moving from lying on your back to sitting on the side of a flat bed without using bedrails?: A Little Help needed moving to and from a bed to a chair (including a wheelchair)?: A Little Help needed standing up from a chair using your arms (e.g., wheelchair or bedside chair)?: A Little Help needed to walk in hospital  room?: A Little Help needed climbing 3-5 steps with a railing? : A Little 6 Click Score: 18    End of Session Equipment Utilized During Treatment: Gait belt Activity Tolerance: Patient tolerated treatment well Patient left: in bed;with call bell/phone within reach   PT Visit Diagnosis: Other abnormalities of gait and mobility (R26.89);Muscle weakness (generalized) (M62.81);Pain Pain - Right/Left: Right Pain - part of body: Hip     Time: 2725-3664 PT Time Calculation (min) (ACUTE ONLY): 22 min  Charges:  $Gait Training: 8-22 mins                        Doreatha Massed, PT Acute Rehabilitation  Office: [phone] Pager: [phone]

## 2020-09-04 NOTE — Progress Notes (Addendum)
   Subjective: 2 Days Post-Op Procedure(s) (LRB): RIGHT TOTAL HIP ARTHROPLASTY ANTERIOR APPROACH (Right) Patient reports pain as mild.  I bed moving leg had increased pain in groin. Did PT well. Temp last night 101.4  Objective: Vital signs in last 24 hours: Temp:  [98.2 F (36.8 C)-101.4 F (38.6 C)] 98.9 F (37.2 C) (12/19 0805) Pulse Rate:  [95-109] 106 (12/19 0805) Resp:  [18-19] 19 (12/19 0805) BP: (103-127)/(60-89) 106/89 (12/19 0805) SpO2:  [91 %-98 %] 93 % (12/19 0805)  Intake/Output from previous day: 12/18 0701 - 12/19 0700 In: 1790.6 [P.O.:1680; I.V.:110.6] Out: 1530 [Urine:1530] Intake/Output this shift: No intake/output data recorded.  Recent Labs    09/03/20 0248  HGB 11.2*   Recent Labs    09/03/20 0248  WBC 8.3  RBC 3.89*  HCT 34.2*  PLT 141*   Recent Labs    09/03/20 0248  NA 139  K 4.3  CL 107  CO2 21*  BUN 14  CREATININE 1.14  GLUCOSE 119*  CALCIUM 8.3*   No results for input(s): LABPT, INR in the last 72 hours.  Neurologically intact No results found.  Assessment/Plan: 2 Days Post-Op Procedure(s) (LRB): RIGHT TOTAL HIP ARTHROPLASTY ANTERIOR APPROACH (Right) Up with therapy, discharge after therapy. Continue IS. Temp 101.4 cleared last PM with triflow IS. Past smoker quit [id number]  [name] 09/04/2020, 9:37 AM

## 2020-09-04 NOTE — Progress Notes (Signed)
Dr. Lorin Mercy notified of yellow mews with increased temp of 101.4 and elevated hr of 108. No new orders obtained. Rn will continue to monitor.

## 2020-09-04 NOTE — Progress Notes (Signed)
   09/04/20 0603  Assess: MEWS Score  Temp (!) 101.4 F (38.6 C)  BP 119/64  Pulse Rate (!) 108  Resp 18  SpO2 92 %  O2 Device Room Air  Assess: MEWS Score  MEWS Temp 1  MEWS Systolic 0  MEWS Pulse 1  MEWS RR 0  MEWS LOC 0  MEWS Score 2  MEWS Score Color Yellow  Assess: if the MEWS score is Yellow or Red  Were vital signs taken at a resting state? Yes  Focused Assessment No change from prior assessment  Early Detection of Sepsis Score *See Row Information* Medium  MEWS guidelines implemented *See Row Information* Yes  Treat  MEWS Interventions Administered prn meds/treatments  Take Vital Signs  Increase Vital Sign Frequency  Yellow: Q 2hr X 2 then Q 4hr X 2, if remains yellow, continue Q 4hrs  Escalate  MEWS: Escalate Yellow: discuss with charge nurse/RN and consider discussing with provider and RRT  Notify: Charge Nurse/RN  Name of Charge Nurse/RN Notified Geoffry Paradise  Date Charge Nurse/RN Notified 09/04/20  Time Charge Nurse/RN Notified [id number]  Notify: Provider  Provider Name/Title Dr. Lorin Mercy  Date Provider Notified 09/04/20  Time Provider Notified [phone]  Notification Type Page  Notification Reason Change in status  Response No new orders  Date of Provider Response 09/04/20  Time of Provider Response [phone]  Document  Progress note created (see row info) Yes  No new orders, just encourage incentive spirometer usage

## 2020-09-04 NOTE — Progress Notes (Signed)
Pt stable at this time. No needs time of d/c instructions and education. Pt dressing changed without complication prior to d/c per md order. No questions on d/c instructions and eduction given.

## 2020-09-05 ENCOUNTER — Encounter (HOSPITAL_COMMUNITY): Payer: Self-pay | Admitting: Orthopaedic Surgery

## 2020-09-15 ENCOUNTER — Inpatient Hospital Stay: Payer: Medicare Other | Admitting: Orthopaedic Surgery

## 2020-09-19 ENCOUNTER — Encounter: Payer: Self-pay | Admitting: Physician Assistant

## 2020-09-19 ENCOUNTER — Ambulatory Visit (INDEPENDENT_AMBULATORY_CARE_PROVIDER_SITE_OTHER): Payer: Medicare Other | Admitting: Physician Assistant

## 2020-09-19 DIAGNOSIS — Z96641 Presence of right artificial hip joint: Secondary | ICD-10-CM

## 2020-09-19 NOTE — Progress Notes (Signed)
HPI: Mr. Brockmann returns today status post right total hip arthroplasty 09/02/2020.  He states he is overall doing well.  He is walking with a cane.  Denies any shortness of breath fevers chills.  He occasionally is taking some Percocet.  He is on aspirin for DVT prophylaxis he was on no aspirin prior to surgery.  Physical exam: Right hip surgical incisions well approximated with staples no signs of infection.  No evidence of seroma.  There is no drainage.  Right calf supple nontender.  Fluid range of motion of the right hip.  Impression: Status post right total hip arthroplasty 09/02/2020  Plan: We will continue work on range of motion strengthening.  Staples removed Steri-Strips applied.  He will work on scar tissue mobilization.  Follow-up with [place] in a month sooner if there is any questions or concerns.

## 2020-11-21 ENCOUNTER — Ambulatory Visit (INDEPENDENT_AMBULATORY_CARE_PROVIDER_SITE_OTHER): Payer: Medicare Other | Admitting: Otolaryngology

## 2020-11-21 ENCOUNTER — Other Ambulatory Visit: Payer: Self-pay

## 2020-11-21 VITALS — Temp 97.7°F

## 2020-11-21 DIAGNOSIS — H6123 Impacted cerumen, bilateral: Secondary | ICD-10-CM | POA: Diagnosis not present

## 2020-11-21 DIAGNOSIS — H903 Sensorineural hearing loss, bilateral: Secondary | ICD-10-CM | POA: Diagnosis not present

## 2020-11-21 NOTE — Progress Notes (Signed)
HPI: [name] is a 80 y.o. male who presents for evaluation of blocked right ear as well as overall decreased hearing.  He has tried hydrogen peroxide to clean the right ear.  He has had no ear pain or discomfort.  Past Medical History:  Diagnosis Date  . Abnormal CT of the chest 04/16/13   . Anemia    sees dr Griffin Basil OF IRON DEFICIENCY ANEMIA FROM INTERMITTENT GI BLOOD LOSS RELATED TO SMALL BOWEL AVMS  . Avascular necrosis ([place])    LEFT HIP - PT STATES HE WALKS WITH LIMP AND PAIN AT TIMES  . AVM (arteriovenous malformation) of colon    HX OF GI BLOOD LOSS RELATED TO SMALL BOWEL AVM'S  . GERD (gastroesophageal reflux disease)   . Gout   . Hyperlipemia   . Hypertension   . IgG lambda monoclonal gammopathy    "OF UNDETERMINED SIGNIFICANCE" - FOLLOWED BY DR. Beryle Beams  . Lumbar pain    HX O PAIN LEFT LEG AND XRAYS BACK IN 2005 OR 2006 AND TOLD DISC RUBBING ON NERVE - GIVEN INJECTIONS LOWER BACK - DIDN'T SOLVE THE PAIN DOWN LEFT LEG  . Lymphadenopathy, axillary 04/07/2013   3 cm right axillary node S/P BIOPSY - NOT MALIGNANT  . MGUS (monoclonal gammopathy of unknown significance) 01/10/2016  . Sickle cell trait ([place])   . Wears glasses   . Wears partial dentures    top and bottom partials   Past Surgical History:  Procedure Laterality Date  . APPENDECTOMY    . COLONOSCOPY    . ENTEROSCOPY  08/24/2011   Procedure: ENTEROSCOPY;  Surgeon: Inda Castle, MD;  [facility];  Service: Endoscopy;  Laterality: N/A;  . HERNIA REPAIR     rt ing  . MASS EXCISION Right 09/01/2013   Procedure: EXCISION AXILLA  MASS;  Surgeon: Joyice Faster. Cornett, MD;  Location: [place];  Service: General;  Laterality: Right;  . TOTAL HIP ARTHROPLASTY Left 12/25/2013   Procedure: LEFT TOTAL HIP ARTHROPLASTY ANTERIOR APPROACH;  Surgeon: Mcarthur Rossetti, MD;  [facility];  Service: Orthopedics;  Laterality: Left;  . TOTAL HIP ARTHROPLASTY Right 09/02/2020    Procedure: RIGHT TOTAL HIP ARTHROPLASTY ANTERIOR APPROACH;  Surgeon: Mcarthur Rossetti, MD;  [facility];  Service: Orthopedics;  Laterality: Right;   Social History   Socioeconomic History  . Marital status: Married    Spouse name: Not on file  . Number of children: 1  . Years of education: Not on file  . Highest education level: Not on file  Occupational History  . Occupation: Retired     Fish farm manager: RETIRED  Tobacco Use  . Smoking status: Former Smoker    Quit date: 08/27/1992    Years since quitting: 28.2  . Smokeless tobacco: Never Used  Vaping Use  . Vaping Use: Never used  Substance and Sexual Activity  . Alcohol use: Yes    Alcohol/week: 0.0 standard drinks    Comment: Gin and beer every other day  . Drug use: No  . Sexual activity: Not on file  Other Topics Concern  . Not on file  Social History Narrative  . Not on file   Social Determinants of Health   Financial Resource Strain: Not on file  Food Insecurity: Not on file  Transportation Needs: Not on file  Physical Activity: Not on file  Stress: Not on file  Social Connections: Not on file   Family History  Problem Relation Age of Onset  .  Colon cancer Neg Hx   . Esophageal cancer Neg Hx   . Rectal cancer Neg Hx   . Stomach cancer Neg Hx    No Known Allergies Prior to Admission medications   Medication Sig Start Date End Date Taking? Authorizing Provider  allopurinol (ZYLOPRIM) 100 MG tablet Take 100 mg by mouth daily. 06/19/11   [provider]  aspirin 81 MG chewable tablet Chew 1 tablet (81 mg total) by mouth 2 (two) times daily. 09/03/20   Mcarthur Rossetti, MD  atenolol (TENORMIN) 25 MG tablet Take 25 mg by mouth every morning. 06/18/11   [provider]  atorvastatin (LIPITOR) 20 MG tablet Take 20 mg by mouth at bedtime.    [provider]  Cinnamon 500 MG capsule Take 500 mg by mouth daily.    [provider]  esomeprazole (NEXIUM) 20 MG capsule  Take 20 mg by mouth daily as needed (Heartburn).     [provider]  ferrous sulfate 325 (65 FE) MG tablet Take 325 mg by mouth 2 (two) times daily with a meal.    [provider]  Multiple Vitamin (MULTIVITAMIN WITH MINERALS) TABS tablet Take 1 tablet by mouth daily. Centrum Silver    [provider]  naproxen sodium (ALEVE) 220 MG tablet Take 220 mg by mouth daily as needed (Pain).    [provider]  oxyCODONE (OXY IR/ROXICODONE) 5 MG immediate release tablet Take 1 tablet (5 mg total) by mouth every 4 (four) hours as needed for moderate pain (pain score 4-6). 09/03/20   Mcarthur Rossetti, MD  vitamin C (ASCORBIC ACID) 500 MG tablet Take 500 mg by mouth daily.    [provider]  Zinc Acetate, Oral, (ZINC ACETATE PO) Take 1 tablet by mouth daily.     [provider]     Positive ROS: Otherwise negative  All other systems have been reviewed and were otherwise negative with the exception of those mentioned in the HPI and as above.  Physical Exam: Constitutional: Alert, well-appearing, no acute distress Ears: External ears without lesions or tenderness.  Right ear canal is completely occluded with cerumen that was removed with suction and hydroperoxide.  Had minimal wax buildup on the left side that was removed with forceps.  TMs were clear bilaterally however on hearing screening with the 1024 tuning fork he had moderate hearing loss in both ears. Nasal: External nose without lesions. . Clear nasal passages Oral: Lips and gums without lesions. Tongue and palate mucosa without lesions. Posterior oropharynx clear. Neck: No palpable adenopathy or masses Respiratory: Breathing comfortably  Skin: No facial/neck lesions or rash noted.  Cerumen impaction removal  Date/Time: 11/21/2020 2:41 PM Performed by: Rozetta Nunnery, MD Authorized by: Rozetta Nunnery, MD   Consent:    Consent obtained:  Verbal   Consent given by:   Patient   Risks discussed:  Pain and bleeding Procedure details:    Location:  L ear and R ear   Procedure type: curette, suction and forceps   Post-procedure details:    Inspection:  TM intact and canal normal   Hearing quality:  Improved   Patient tolerance of procedure:  Tolerated well, no immediate complications Comments:     TMs are clear bilaterally.  Audiologic testing demonstrated a mild to moderate severe downsloping sensorineural hearing loss in both ears which was symmetric with SRT's of 30 DB on the right and 25 DB on the left.  He had type A tympanograms  bilaterally.  Assessment: Cerumen impaction in the right ear. Bilateral underlying sensorineural hearing loss.  Plan: Would recommend use of hearing aids for his hearing loss.  He will follow up here as needed.  Radene Journey, MD

## 2020-11-23 ENCOUNTER — Encounter (INDEPENDENT_AMBULATORY_CARE_PROVIDER_SITE_OTHER): Payer: Self-pay

## 2021-01-15 NOTE — Progress Notes (Signed)
Cardiology Office Note:    Date:  01/16/2021   ID:  [name], DOB 10-Aug-1941, MRN [id number]  PCP:  Haywood Pao, MD   [place]  Cardiologist:  Werner Lean, MD  Advanced Practice Provider:  No care team member to display Electrophysiologist:  None       CC: Winded with exertion Consulted for the evaluation of chest pain with exertion at the behest of Tisovec, Fransico Him, MD  History of Present Illness:    [name] is a 80 y.o. male with a hx of of MGUS, Sickle Cell Trait, Aortic Atherosclerosis and CAC, history of HTN who presents for evaluation 01/16/21.  Patient notes that he is feeling good.  Has had no chest pain, chest pressure, chest tightness, chest stinging.  Discomfort (winded) occurs with exertion, worsens with mowing the lawn, and improves with rest.   No shortness of breath..  No PND or orthopnea.  No bendopnea, weight gain, leg swelling , or abdominal swelling.  No syncope or near syncope.  Notes  no palpitations or funny heart beats.     Saw a heart doctor in 2006 (came here for [place]) but wasn't clear why he was seeing a heart doctor.  Had normal stress test at that time.   Past Medical History:  Diagnosis Date  . Abnormal CT of the chest 04/16/13   . Anemia    sees dr Griffin Basil OF IRON DEFICIENCY ANEMIA FROM INTERMITTENT GI BLOOD LOSS RELATED TO SMALL BOWEL AVMS  . 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 AVM (arteriovenous malformation) of colon    HX OF GI BLOOD LOSS RELATED TO SMALL BOWEL AVM'S  . GERD (gastroesophageal reflux disease)   . Gout   . Hyperlipemia   . Hypertension   . IgG lambda monoclonal gammopathy    "OF UNDETERMINED SIGNIFICANCE" - FOLLOWED BY DR. Beryle Beams  . Lumbar pain    HX O PAIN LEFT LEG AND XRAYS BACK IN 2005 OR 2006 AND TOLD DISC RUBBING ON NERVE - GIVEN INJECTIONS LOWER BACK - DIDN'T SOLVE THE PAIN DOWN LEFT LEG  .  Lymphadenopathy, axillary 04/07/2013   3 cm right axillary node S/P BIOPSY - NOT MALIGNANT  . MGUS (monoclonal gammopathy of unknown significance) 01/10/2016  . Sickle cell trait ([place])   . Wears glasses   . Wears partial dentures    top and bottom partials    Past Surgical History:  Procedure Laterality Date  . APPENDECTOMY    . COLONOSCOPY    . ENTEROSCOPY  08/24/2011   Procedure: ENTEROSCOPY;  Surgeon: Inda Castle, MD;  [facility];  Service: Endoscopy;  Laterality: N/A;  . HERNIA REPAIR     rt ing  . MASS EXCISION Right 09/01/2013   Procedure: EXCISION AXILLA  MASS;  Surgeon: Joyice Faster. Cornett, MD;  Location: [facility];  Service: General;  Laterality: Right;  . TOTAL HIP ARTHROPLASTY Left 12/25/2013   Procedure: LEFT TOTAL HIP ARTHROPLASTY ANTERIOR APPROACH;  Surgeon: Mcarthur Rossetti, MD;  [facility];  Service: Orthopedics;  Laterality: Left;  . TOTAL HIP ARTHROPLASTY Right 09/02/2020   Procedure: RIGHT TOTAL HIP ARTHROPLASTY ANTERIOR APPROACH;  Surgeon: Mcarthur Rossetti, MD;  [facility];  Service: Orthopedics;  Laterality: Right;    Current Medications: Current Meds  Medication Sig  . allopurinol (ZYLOPRIM) 100 MG tablet Take 100 mg by mouth daily.  Marland Kitchen  atenolol (TENORMIN) 25 MG tablet Take 25 mg by mouth every morning.  Marland Kitchen atorvastatin (LIPITOR) 40 MG tablet Take 1 tablet (40 mg total) by mouth daily.  . Cinnamon 500 MG capsule Take 500 mg by mouth daily.  Marland Kitchen esomeprazole (NEXIUM) 20 MG capsule Take 20 mg by mouth daily as needed (Heartburn).   . ferrous sulfate 325 (65 FE) MG tablet Take 325 mg by mouth 2 (two) times daily with a meal.  . metoprolol tartrate (LOPRESSOR) 100 MG tablet Take 2 hours before Cardiac CT  . Multiple Vitamin (MULTIVITAMIN WITH MINERALS) TABS tablet Take 1 tablet by mouth daily. Centrum Silver  . naproxen sodium (ALEVE) 220 MG tablet Take 220 mg by mouth daily as needed (Pain).  Marland Kitchen oxyCODONE (OXY  IR/ROXICODONE) 5 MG immediate release tablet Take 1 tablet (5 mg total) by mouth every 4 (four) hours as needed for moderate pain (pain score 4-6).  . vitamin C (ASCORBIC ACID) 500 MG tablet Take 500 mg by mouth daily.  . Zinc Acetate, Oral, (ZINC ACETATE PO) Take 1 tablet by mouth daily.   . [DISCONTINUED] aspirin 81 MG chewable tablet Chew 1 tablet (81 mg total) by mouth 2 (two) times daily.  . [DISCONTINUED] atorvastatin (LIPITOR) 20 MG tablet Take 20 mg by mouth at bedtime.     Allergies:   Patient has no known allergies.   Social History   Socioeconomic History  . Marital status: Married    Spouse name: Not on file  . Number of children: 1  . Years of education: Not on file  . Highest education level: Not on file  Occupational History  . Occupation: Retired     Fish farm manager: RETIRED  Tobacco Use  . Smoking status: Former Smoker    Quit date: 08/27/1992    Years since quitting: 28.4  . Smokeless tobacco: Never Used  Vaping Use  . Vaping Use: Never used  Substance and Sexual Activity  . Alcohol use: Yes    Alcohol/week: 0.0 standard drinks    Comment: Gin and beer every other day  . Drug use: No  . Sexual activity: Not on file  Other Topics Concern  . Not on file  Social History Narrative  . Not on file   Social Determinants of Health   Financial Resource Strain: Not on file  Food Insecurity: Not on file  Transportation Needs: Not on file  Physical Activity: Not on file  Stress: Not on file  Social Connections: Not on file     Family History: The patient's family history is negative for Colon cancer, Esophageal cancer, Rectal cancer, and Stomach cancer. History of coronary artery disease notable for no members. History of heart failure notable for no members. History of arrhythmia notable for no members.  ROS:   Please see the history of present illness.     All other systems reviewed and are negative.  EKGs/Labs/Other Studies Reviewed:    The following  studies were reviewed today:  EKG:  EKG is  ordered today.  The ekg ordered today demonstrates  01/16/21: SR rate 90 WNL  NonCardiac CT: Date: 01/19/2016 Results: Aortic atherosclerosis and LAD Coronary calcification   Recent Labs: 09/03/2020: BUN 14; Creatinine, Ser 1.14; Hemoglobin 11.2; Platelets 141; Potassium 4.3; Sodium 139  Recent Lipid Panel No results found for: CHOL, TRIG, HDL, CHOLHDL, VLDL, LDLCALC, LDLDIRECT  Outside Hospital  or Outside Clinic Studies (OSH):  Date: 07/01/2020 Cholesterol 149 HDL 52 LDL 75 Tgs 112   Risk Assessment/Calculations:  NA  Physical Exam:    VS:  BP 122/70   Pulse 90   Ht 6\' 2"  (1.88 m)   Wt 219 lb (99.3 kg)   SpO2 97%   BMI 28.12 kg/m     Wt Readings from Last 3 Encounters:  01/16/21 219 lb (99.3 kg)  09/02/20 220 lb (99.8 kg)  08/29/20 220 lb (99.8 kg)    GEN:  Well nourished, well developed in no acute distress HEENT: Normal NECK: No JVD; No carotid bruits LYMPHATICS: No lymphadenopathy CARDIAC: RRR, no murmurs, rubs, gallops RESPIRATORY:  Clear to auscultation without rales, wheezing or rhonchi  ABDOMEN: Soft, non-tender, non-distended MUSCULOSKELETAL:  No edema; No deformity  SKIN: Warm and dry NEUROLOGIC:  Alert and oriented x 3 PSYCHIATRIC:  Normal affect   ASSESSMENT:    1. DOE (dyspnea on exertion)   2. 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 Aortic atherosclerosis ([place])   4. Hypertension, unspecified type    PLAN:    In order of problems listed above:  DOE concerning for angina equivalent  Aortic Atherosclerosis, CAC, and HLD HTN - The patient presents with possibly cardiac discomfort - EKG  without evidence of accessory pathway, ventricular pacing, digoxin use, LBBB, or baseline ST changes. - clarified ASA which is 81 mg PO Daily - will increase atorvastatin to 40 mg PO Daily and recheck lipids and LFTs in the fall - Would recommend CCTA with possible FFR as needed to exclude obstructive CAD and to assess  for non-obstructive CAD requiring secondary prevention - will need metoprolol 100 mg PO Once (SBP above 110, HR above 65) - reviewed CT from 2017 with patient - if no improvement and only non-obstructive disease, will trial off atenolol        Medication Adjustments/Labs and Tests Ordered: Current medicines are reviewed at length with the patient today.  Concerns regarding medicines are outlined above.  Orders Placed This Encounter  Procedures  . CT CORONARY MORPH W/CTA COR W/SCORE W/CA W/CM &/OR WO/CM  . Basic metabolic panel  . EKG 12-Lead   Meds ordered this encounter  Medications  . aspirin 81 MG chewable tablet    Sig: Chew 1 tablet (81 mg total) by mouth daily.    Dispense:  30 tablet    Refill:  0  . metoprolol tartrate (LOPRESSOR) 100 MG tablet    Sig: Take 2 hours before Cardiac CT    Dispense:  1 tablet    Refill:  0  . atorvastatin (LIPITOR) 40 MG tablet    Sig: Take 1 tablet (40 mg total) by mouth daily.    Dispense:  90 tablet    Refill:  3    Patient Instructions  Medication Instructions:  Your physician has recommended you make the following change in your medication:   INCREASE: atorvastatin (Lipitor) to 40 mg by mouth daily *If you need a refill on your cardiac medications before your next appointment, please call your pharmacy*   Lab Work: NONE If you have labs (blood work) drawn today and your tests are completely normal, you will receive your results only by: Marland Kitchen MyChart Message (if you have MyChart) OR . A paper copy in the mail If you have any lab test that is abnormal or we need to change your treatment, we will call you to review the results.   Testing/Procedures: Your physician has requested that you have cardiac CT. Cardiac computed tomography (CT) is a painless test that uses an x-ray machine to take clear, detailed  pictures of your heart. Please follow instruction sheet as given.     Follow-Up: At [facility], you and your health  needs are our priority.  As part of our continuing mission to provide you with exceptional heart care, we have created designated Provider Care Teams.  These Care Teams include your primary Cardiologist (physician) and Advanced Practice Providers (APPs -  Physician Assistants and Nurse Practitioners) who all work together to provide you with the care you need, when you need it.  We recommend signing up for the patient portal called "MyChart".  Sign up information is provided on this After Visit Summary.  MyChart is used to connect with patients for Virtual Visits (Telemedicine).  Patients are able to view lab/test results, encounter notes, upcoming appointments, etc.  Non-urgent messages can be sent to your provider as well.   To learn more about what you can do with MyChart, go to [email or web address].    Your next appointment:   6 month(s)  The format for your next appointment:   In Person  Provider:   You may see Werner Lean, MD or one of the following Advanced Practice Providers on your designated Care Team:    Melina Copa, PA-C  Ermalinda Barrios, PA-C    Other Instructions  Your cardiac CT will be scheduled at one of the below locations:   [facility] [address] [place], [place] [address] [phone]   Please arrive at the [facility] main entrance (entrance A) of [facility] 30 minutes prior to test start time. Proceed to the [facility] Radiology Department (first floor) to check-in and test prep.   Please follow these instructions carefully (unless otherwise directed):  Hold all erectile dysfunction medications at least 3 days (72 hrs) prior to test.  On the Night Before the Test: . Be sure to Drink plenty of water. . Do not consume any caffeinated/decaffeinated beverages or chocolate 12 hours prior to your test. . Do not take any antihistamines 12 hours prior to your test.  On the Day of the Test: . Drink plenty of water until 1  hour prior to the test. . Do not eat any food 4 hours prior to the test. . You may take your regular medications prior to the test.  . Take metoprolol (Lopressor) 100 mg by mouth two hours prior to test. . HOLD Furosemide/Hydrochlorothiazide morning of the test.       After the Test: . Drink plenty of water. . After receiving IV contrast, you may experience a mild flushed feeling. This is normal. . On occasion, you may experience a mild rash up to 24 hours after the test. This is not dangerous. If this occurs, you can take Benadryl 25 mg and increase your fluid intake. . If you experience trouble breathing, this can be serious. If it is severe call 911 IMMEDIATELY. If it is mild, please call our office. . If you take any of these medications: Glipizide/Metformin, Avandament, Glucavance, please do not take 48 hours after completing test unless otherwise instructed.   Once we have confirmed authorization from your insurance company, we will call you to set up a date and time for your test. Based on how quickly your insurance processes prior authorizations requests, please allow up to 4 weeks to be contacted for scheduling your Cardiac CT appointment. Be advised that routine Cardiac CT appointments could be scheduled as many as 8 weeks after your provider has ordered it.  For non-scheduling related questions,  please contact the cardiac imaging nurse navigator should you have any questions/concerns: [name], Cardiac Imaging Nurse Navigator Gordy Clement, Cardiac Imaging Nurse Navigator  [facility] and Vascular Services Direct Office Dial: [phone]   For scheduling needs, including cancellations and rescheduling, please call [place], [phone].       Signed, Werner Lean, MD  01/16/2021 12:06 PM    [facility]

## 2021-01-16 ENCOUNTER — Ambulatory Visit: Payer: Medicare Other | Admitting: Internal Medicine

## 2021-01-16 ENCOUNTER — Other Ambulatory Visit: Payer: Self-pay

## 2021-01-16 ENCOUNTER — Encounter: Payer: Self-pay | Admitting: Internal Medicine

## 2021-01-16 VITALS — BP 122/70 | HR 90 | Ht 74.0 in | Wt 219.0 lb

## 2021-01-16 DIAGNOSIS — R0600 Dyspnea, unspecified: Secondary | ICD-10-CM | POA: Diagnosis not present

## 2021-01-16 DIAGNOSIS — I10 Essential (primary) hypertension: Secondary | ICD-10-CM | POA: Diagnosis not present

## 2021-01-16 DIAGNOSIS — I208 Other forms of angina pectoris: Secondary | ICD-10-CM | POA: Diagnosis not present

## 2021-01-16 DIAGNOSIS — R0609 Other forms of dyspnea: Secondary | ICD-10-CM | POA: Insufficient documentation

## 2021-01-16 DIAGNOSIS — I700 Atherosclerosis of aorta: Secondary | ICD-10-CM

## 2021-01-16 MED ORDER — METOPROLOL TARTRATE 100 MG PO TABS: ORAL_TABLET | ORAL | 0 refills | Status: DC

## 2021-01-16 MED ORDER — ATORVASTATIN CALCIUM 40 MG PO TABS
40.0000 mg | ORAL_TABLET | Freq: Every day | ORAL | 3 refills | Status: DC
Start: 2021-01-16 — End: 2022-04-25

## 2021-01-16 MED ORDER — ASPIRIN 81 MG PO CHEW: 81.0000 mg | CHEWABLE_TABLET | Freq: Every day | ORAL | 0 refills | Status: DC

## 2021-01-16 NOTE — Patient Instructions (Signed)
Medication Instructions:  Your physician has recommended you make the following change in your medication:   INCREASE: atorvastatin (Lipitor) to 40 mg by mouth daily *If you need a refill on your cardiac medications before your next appointment, please call your pharmacy*   Lab Work: NONE If you have labs (blood work) drawn today and your tests are completely normal, you will receive your results only by: [place] MyChart Message (if you have MyChart) OR . A paper copy in the mail If you have any lab test that is abnormal or we need to change your treatment, we will call you to review the results.   Testing/Procedures: Your physician has requested that you have cardiac CT. Cardiac computed tomography (CT) is a painless test that uses an x-ray machine to take clear, detailed pictures of your heart. Please follow instruction sheet as given.     Follow-Up: At [facility], you and your health needs are our priority.  As part of our continuing mission to provide you with exceptional heart care, we have created designated Provider Care Teams.  These Care Teams include your primary Cardiologist (physician) and Advanced Practice Providers (APPs -  Physician Assistants and Nurse Practitioners) who all work together to provide you with the care you need, when you need it.  We recommend signing up for the patient portal called "MyChart".  Sign up information is provided on this After Visit Summary.  MyChart is used to connect with patients for Virtual Visits (Telemedicine).  Patients are able to view lab/test results, encounter notes, upcoming appointments, etc.  Non-urgent messages can be sent to your provider as well.   To learn more about what you can do with MyChart, go to [email or web address].    Your next appointment:   6 month(s)  The format for your next appointment:   In Person  Provider:   You may see Werner Lean, MD or one of the following Advanced Practice Providers on  your designated Care Team:    Melina Copa, PA-C  Ermalinda Barrios, PA-C    Other Instructions  Your cardiac CT will be scheduled at one of the below locations:   [facility] [address] [place], [place] [address] [phone]   Please arrive at the [facility] main entrance (entrance A) of [facility] 30 minutes prior to test start time. Proceed to the [facility] Radiology Department (first floor) to check-in and test prep.   Please follow these instructions carefully (unless otherwise directed):  Hold all erectile dysfunction medications at least 3 days (72 hrs) prior to test.  On the Night Before the Test: . Be sure to Drink plenty of water. . Do not consume any caffeinated/decaffeinated beverages or chocolate 12 hours prior to your test. . Do not take any antihistamines 12 hours prior to your test.  On the Day of the Test: . Drink plenty of water until 1 hour prior to the test. . Do not eat any food 4 hours prior to the test. . You may take your regular medications prior to the test.  . Take metoprolol (Lopressor) 100 mg by mouth two hours prior to test. . HOLD Furosemide/Hydrochlorothiazide morning of the test.       After the Test: . Drink plenty of water. . After receiving IV contrast, you may experience a mild flushed feeling. This is normal. . On occasion, you may experience a mild rash up to 24 hours after the test. This is not dangerous. If this  occurs, you can take Benadryl 25 mg and increase your fluid intake. . If you experience trouble breathing, this can be serious. If it is severe call 911 IMMEDIATELY. If it is mild, please call our office. . If you take any of these medications: Glipizide/Metformin, Avandament, Glucavance, please do not take 48 hours after completing test unless otherwise instructed.   Once we have confirmed authorization from your insurance company, we will call you to set up a date and time for your test. Based on  how quickly your insurance processes prior authorizations requests, please allow up to 4 weeks to be contacted for scheduling your Cardiac CT appointment. Be advised that routine Cardiac CT appointments could be scheduled as many as 8 weeks after your provider has ordered it.  For non-scheduling related questions, please contact the cardiac imaging nurse navigator should you have any questions/concerns: [name], Cardiac Imaging Nurse Navigator Gordy Clement, Cardiac Imaging Nurse Navigator [facility] Direct Office Dial: [phone]   For scheduling needs, including cancellations and rescheduling, please call [place], [phone].

## 2021-01-19 ENCOUNTER — Telehealth: Payer: Self-pay

## 2021-01-19 NOTE — Telephone Encounter (Signed)
Called pt to schedule BMET for 01/27/21 Cardiac CT. Left a message for him to call the office.

## 2021-01-19 NOTE — Telephone Encounter (Signed)
-----   Message from [place] sent at 01/18/2021  2:43 PM EDT ----- Regarding: ct heart Schedule 01/27/21 at 10:00   He will need labs done for this ct scan.   Thanks, [place]

## 2021-01-20 ENCOUNTER — Other Ambulatory Visit: Payer: Medicare Other | Admitting: *Deleted

## 2021-01-20 ENCOUNTER — Other Ambulatory Visit: Payer: Self-pay

## 2021-01-20 DIAGNOSIS — R0600 Dyspnea, unspecified: Secondary | ICD-10-CM

## 2021-01-20 DIAGNOSIS — R0609 Other forms of dyspnea: Secondary | ICD-10-CM

## 2021-01-20 DIAGNOSIS — I208 Other forms of angina pectoris: Secondary | ICD-10-CM

## 2021-01-21 LAB — BASIC METABOLIC PANEL
BUN/Creatinine Ratio: 8 — ABNORMAL LOW (ref 10–24)
BUN: 11 mg/dL (ref 8–27)
CO2: 23 mmol/L (ref 20–29)
Calcium: 8.8 mg/dL (ref 8.6–10.2)
Chloride: 108 mmol/L — ABNORMAL HIGH (ref 96–106)
Creatinine, Ser: 1.3 mg/dL — ABNORMAL HIGH (ref 0.76–1.27)
Glucose: 104 mg/dL — ABNORMAL HIGH (ref 65–99)
Potassium: 4.2 mmol/L (ref 3.5–5.2)
Sodium: 144 mmol/L (ref 134–144)
eGFR: 57 mL/min/{1.73_m2} — ABNORMAL LOW (ref >59)

## 2021-01-23 ENCOUNTER — Telehealth: Payer: Self-pay

## 2021-01-23 DIAGNOSIS — I10 Essential (primary) hypertension: Secondary | ICD-10-CM

## 2021-01-23 NOTE — Telephone Encounter (Signed)
-----   Message from Werner Lean, MD sent at 01/23/2021  3:04 PM EDT ----- Results: Slight increase in creatinine from 4 months prior Plan: Ok for CCTA BMP one week post  Werner Lean, MD

## 2021-01-23 NOTE — Telephone Encounter (Signed)
The patient has been notified of the result and verbalized understanding.  All questions (if any) were answered. Antonieta Iba, RN 01/23/2021 3:41 PM  Repeat lab work has been scheduled.

## 2021-01-25 ENCOUNTER — Telehealth (HOSPITAL_COMMUNITY): Payer: Self-pay | Admitting: Emergency Medicine

## 2021-01-25 NOTE — Telephone Encounter (Signed)
Attempted to call patient regarding upcoming cardiac CT appointment. °Left message on voicemail with name and callback number °Aariz Maish RN Navigator Cardiac Imaging °[facility] °[phone] Office °[phone] Cell ° °

## 2021-01-25 NOTE — Telephone Encounter (Signed)
Reaching out to patient to offer assistance regarding upcoming cardiac imaging study; pt verbalizes understanding of appt date/time, parking situation and where to check in, pre-test NPO status and medications ordered, and verified current allergies; name and call back number provided for further questions should they arise Ruben Bond RN Navigator Cardiac Imaging [name] [phone] office [phone] cell  100mg  metoprolol tartrate [name]

## 2021-01-27 ENCOUNTER — Other Ambulatory Visit: Payer: Self-pay

## 2021-01-27 ENCOUNTER — Ambulatory Visit (HOSPITAL_COMMUNITY)
Admission: RE | Admit: 2021-01-27 | Discharge: 2021-01-27 | Disposition: A | Discharge status: Home / Self Care | Payer: Medicare Other | Source: Ambulatory Visit | Attending: Internal Medicine | Admitting: Internal Medicine

## 2021-01-27 DIAGNOSIS — R0600 Dyspnea, unspecified: Secondary | ICD-10-CM | POA: Diagnosis present

## 2021-01-27 DIAGNOSIS — I208 Other forms of angina pectoris: Secondary | ICD-10-CM | POA: Insufficient documentation

## 2021-01-27 DIAGNOSIS — R0609 Other forms of dyspnea: Secondary | ICD-10-CM

## 2021-01-27 MED ORDER — IOHEXOL 350 MG/ML SOLN
95.0000 mL | Freq: Once | INTRAVENOUS | Status: AC | PRN
  Administered 2021-01-27: 95 mL via INTRAVENOUS

## 2021-01-27 MED ORDER — NITROGLYCERIN 0.4 MG SL SUBL
0.8000 mg | SUBLINGUAL_TABLET | Freq: Once | SUBLINGUAL | Status: AC
  Administered 2021-01-27: 0.8 mg via SUBLINGUAL

## 2021-01-27 MED ORDER — IOHEXOL 300 MG/ML  SOLN: 75.0000 mL | Freq: Once | INTRAMUSCULAR | Status: DC | PRN

## 2021-01-27 MED ORDER — DILTIAZEM HCL 25 MG/5ML IV SOLN
5.0000 mg | INTRAVENOUS | Status: DC | PRN
  Administered 2021-01-27 (×2): 5 mg via INTRAVENOUS

## 2021-01-27 MED ORDER — METOPROLOL TARTRATE 5 MG/5ML IV SOLN
INTRAVENOUS | Status: AC
  Administered 2021-01-27: 5 mg via INTRAVENOUS
  Filled 2021-01-27: qty 15

## 2021-01-27 MED ORDER — METOPROLOL TARTRATE 5 MG/5ML IV SOLN: 5.0000 mg | INTRAVENOUS | Status: DC | PRN

## 2021-01-27 MED ORDER — NITROGLYCERIN 0.4 MG SL SUBL
SUBLINGUAL_TABLET | SUBLINGUAL | Status: AC
  Filled 2021-01-27: qty 2

## 2021-01-27 MED ORDER — DILTIAZEM HCL 25 MG/5ML IV SOLN
INTRAVENOUS | Status: AC
  Administered 2021-01-27: 5 mg via INTRAVENOUS
  Filled 2021-01-27: qty 5

## 2021-02-03 ENCOUNTER — Other Ambulatory Visit: Payer: Self-pay

## 2021-02-03 ENCOUNTER — Other Ambulatory Visit: Payer: Medicare Other | Admitting: *Deleted

## 2021-02-03 DIAGNOSIS — I10 Essential (primary) hypertension: Secondary | ICD-10-CM

## 2021-02-04 LAB — BASIC METABOLIC PANEL
BUN/Creatinine Ratio: 11 (ref 10–24)
BUN: 14 mg/dL (ref 8–27)
CO2: 21 mmol/L (ref 20–29)
Calcium: 9.2 mg/dL (ref 8.6–10.2)
Chloride: 105 mmol/L (ref 96–106)
Creatinine, Ser: 1.31 mg/dL — ABNORMAL HIGH (ref 0.76–1.27)
Glucose: 116 mg/dL — ABNORMAL HIGH (ref 65–99)
Potassium: 4.3 mmol/L (ref 3.5–5.2)
Sodium: 139 mmol/L (ref 134–144)
eGFR: 56 mL/min/{1.73_m2} — ABNORMAL LOW (ref >59)

## 2021-07-16 NOTE — Progress Notes (Signed)
Cardiology Office Note:    Date:  07/17/2021   ID:  [name], DOB Jan 22, 1941, MRN [id number]  PCP:  Ruben Pao, MD   [name]  Cardiologist:  Ruben Lean, MD  Advanced Practice Provider:  No care team member to display Electrophysiologist:  None      CC: Winded with exertion  History of Present Illness:    [name] is a 80 y.o. male with a hx of of MGUS, Sickle Cell Trait, Aortic Atherosclerosis and CAC, history of HTN who presents for evaluation 01/16/21. In interim of this visit, patient had negative cardiac CT (only minimal non obstructive CAD). Seen 07/17/21  Patient notes that he is doing alright.  No medication changes.  There are no interval hospital/ED visit.    No chest pain or pressure.  No SOB at rest. No weight gain or leg swelling.  No palpitations or syncope. Notes some DOE with mowing his lawn.  Doesn't remember the last time he could mow the lawn without feeling winded.   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 Beryle Norris   Lumbar pain    HX O PAIN LEFT LEG AND XRAYS BACK IN 2005 OR 2006 AND TOLD DISC RUBBING ON NERVE - GIVEN INJECTIONS LOWER BACK - DIDN'T SOLVE THE PAIN DOWN LEFT LEG   Lymphadenopathy, axillary 04/07/2013   3 cm right axillary node S/P BIOPSY - NOT MALIGNANT   MGUS (monoclonal gammopathy of unknown significance) 01/10/2016   Sickle cell trait (HCC)    Wears glasses    Wears partial  dentures    top and bottom partials    Past Surgical History:  Procedure Laterality Date   APPENDECTOMY     COLONOSCOPY     ENTEROSCOPY  08/24/2011   Procedure: ENTEROSCOPY;  Surgeon: Ruben Castle, MD;  Location: [facility];  Service: Endoscopy;  Laterality: N/A;   HERNIA REPAIR     rt ing   MASS EXCISION Right 09/01/2013   Procedure: EXCISION AXILLA  MASS;  Surgeon: Ruben Faster. Cornett, MD;  Location: [facility];  Service: General;  Laterality: Right;   TOTAL HIP ARTHROPLASTY Left 12/25/2013   Procedure: LEFT TOTAL HIP ARTHROPLASTY ANTERIOR APPROACH;  Surgeon: Ruben Rossetti, MD;  Location: [facility];  Service: Orthopedics;  Laterality: Left;   TOTAL HIP ARTHROPLASTY Right 09/02/2020   Procedure: RIGHT TOTAL HIP ARTHROPLASTY ANTERIOR APPROACH;  Surgeon: Ruben Rossetti, MD;  Location: [facility];  Service: Orthopedics;  Laterality: Right;    Current Medications: Current Meds  Medication Sig   allopurinol (ZYLOPRIM) 100 MG tablet Take 100 mg by mouth daily.   aspirin EC 81 MG tablet Take 81 mg by mouth daily. Swallow whole.   atenolol (TENORMIN) 25 MG tablet Take 25 mg by mouth every morning.   atorvastatin (LIPITOR) 40 MG tablet Take 1 tablet (40 mg  total) by mouth daily.   Cinnamon 500 MG capsule Take 500 mg by mouth daily.   esomeprazole (NEXIUM) 20 MG capsule Take 20 mg by mouth daily as needed (Heartburn).    ferrous sulfate 325 (65 FE) MG tablet Take 325 mg by mouth 2 (two) times daily with a meal.   metoprolol tartrate (LOPRESSOR) 100 MG tablet Take 2 hours before Cardiac CT   Multiple Vitamin (MULTIVITAMIN WITH MINERALS) TABS tablet Take 1 tablet by mouth daily. Centrum Silver   naproxen sodium (ALEVE) 220 MG tablet Take 220 mg by mouth daily as needed (Pain).   oxyCODONE (OXY IR/ROXICODONE) 5 MG immediate release tablet Take 1 tablet (5 mg total) by mouth every 4 (four) hours as needed for moderate pain (pain score 4-6).   vitamin C (ASCORBIC ACID)  500 MG tablet Take 500 mg by mouth daily.   Zinc Acetate, Oral, (ZINC ACETATE PO) Take 1 tablet by mouth daily.      Allergies:   Patient has no known allergies.   Social History   Socioeconomic History   Marital status: Married    Spouse name: Not on file   Number of children: 1   Years of education: Not on file   Highest education level: Not on file  Occupational History   Occupation: Retired     Fish farm manager: RETIRED  Tobacco Use   Smoking status: Former    Types: Cigarettes    Quit date: 08/27/1992    Years since quitting: 28.9   Smokeless tobacco: Never  Vaping Use   Vaping Use: Never used  Substance and Sexual Activity   Alcohol use: Yes    Alcohol/week: 0.0 standard drinks    Comment: Gin and beer every other day   Drug use: No   Sexual activity: Not on file  Other Topics Concern   Not on file  Social History Narrative   Not on file   Social Determinants of Health   Financial Resource Strain: Not on file  Food Insecurity: Not on file  Transportation Needs: Not on file  Physical Activity: Not on file  Stress: Not on file  Social Connections: Not on file    Social: From [place]  Family History: The patient's family history is negative for Colon cancer, Esophageal cancer, Rectal cancer, and Stomach cancer. History of coronary artery disease notable for no members. History of heart failure notable for no members. History of arrhythmia notable for no members.  ROS:   Please see the history of present illness.     All other systems reviewed and are negative.  EKGs/Labs/Other Studies Reviewed:    The following studies were reviewed today:  EKG:   01/16/21: SR rate 90 WNL  Cardiac CT: Date: 01/27/2021 Results: IMPRESSION: 1. Coronary calcium score of 564. This was 40 percentile for age and sex matched control.   2. Normal coronary origin with right dominance.   3. LAD and RCA calcified plaque, 0-24%, non flow limiting.   Recent  Labs: 09/03/2020: Hemoglobin 11.2; Platelets 141 02/03/2021: BUN 14; Creatinine, Ser 1.31; Potassium 4.3; Sodium 139  Recent Lipid Panel No results found for: CHOL, TRIG, HDL, CHOLHDL, VLDL, LDLCALC, LDLDIRECT  Outside Hospital  or Outside Clinic Studies (OSH):  Date: 07/01/2020 Cholesterol 149 HDL 52 LDL 75 Tgs 112   Risk Assessment/Calculations:     NA  Physical Exam:    VS:  BP 118/68   Pulse 82   Ht 6\' 2"  (1.88 m)   Wt 218 lb (98.9 kg)  SpO2 100%   BMI 27.99 kg/m     Wt Readings from Last 3 Encounters:  07/17/21 218 lb (98.9 kg)  01/16/21 219 lb (99.3 kg)  09/02/20 220 lb (99.8 kg)    GEN:  Well nourished, well developed in no acute distress HEENT: Normal NECK: No JVD LYMPHATICS: No lymphadenopathy CARDIAC: RRR, no murmurs, rubs, gallops RESPIRATORY:  Clear to auscultation without rales, wheezing or rhonchi  ABDOMEN: Soft, non-tender, non-distended MUSCULOSKELETAL:  +1 edema R leg, trace L leg; No deformity  SKIN: Warm and dry NEUROLOGIC:  Alert and oriented x 3 PSYCHIATRIC:  Normal affect   ASSESSMENT:    1. DOE (dyspnea on exertion)   2. Aortic atherosclerosis ([place])   3. MGUS (monoclonal gammopathy of unknown significance)   4. Coronary artery disease involving native coronary artery of native heart without angina pectoris     PLAN:    In order of problems listed above:  Minimal non obstructive CAD DOE with hx HTN hx of MGUS Aortic Atherosclerosis, CAC, and HLD - ASA 81 mg PO daily - will recheck Lipids today and may increase statin goal LDL < 70 - will echo, BNP and BMP given persistent  - trial off atenolol - based on this may add low dose lasix (20 mg PO daily- lasix naive)  Will plan for 6 months follow up unless new symptoms or abnormal test results warranting change in plan  Would be reasonable for  APP Follow up    Medication Adjustments/Labs and Tests Ordered: Current medicines are reviewed at length with the patient today.   Concerns regarding medicines are outlined above.  Orders Placed This Encounter  Procedures   Basic metabolic panel   Lipid panel   Pro b natriuretic peptide (BNP)   ECHOCARDIOGRAM COMPLETE    No orders of the defined types were placed in this encounter.   Patient Instructions  Medication Instructions:  Your physician recommends that you continue on your current medications as directed. Please refer to the Current Medication list given to you today.  *If you need a refill on your cardiac medications before your next appointment, please call your pharmacy*   Lab Work: TODAY: BMET, BNP, FLP  If you have labs (blood work) drawn today and your tests are completely normal, you will receive your results only by: [place] (if you have MyChart) OR A paper copy in the mail If you have any lab test that is abnormal or we need to change your treatment, we will call you to review the results.   Testing/Procedures: Your physician has requested that you have an echocardiogram. Echocardiography is a painless test that uses sound waves to create images of your heart. It provides your doctor with information about the size and shape of your heart and how well your heart's chambers and valves are working. This procedure takes approximately one hour. There are no restrictions for this procedure.   Follow-Up: At [facility], you and your health needs are our priority.  As part of our continuing mission to provide you with exceptional heart care, we have created designated Provider Care Teams.  These Care Teams include your primary Cardiologist (physician) and Advanced Practice Providers (APPs -  Physician Assistants and Nurse Practitioners) who all work together to provide you with the care you need, when you need it.   Your next appointment:   6 month(s)  The format for your next appointment:   In Person  Provider:   You may see Yasmyn Bellisario A  Gasper Sells, MD or one of the following  Advanced Practice Providers on your designated Care Team:   Melina Copa, PA-C Ermalinda Barrios, PA-C     Signed, Ruben Lean, MD  07/17/2021 9:35 AM    [place]

## 2021-07-17 ENCOUNTER — Encounter: Payer: Self-pay | Admitting: Internal Medicine

## 2021-07-17 ENCOUNTER — Other Ambulatory Visit: Payer: Self-pay

## 2021-07-17 ENCOUNTER — Ambulatory Visit: Payer: Medicare Other | Admitting: Internal Medicine

## 2021-07-17 VITALS — BP 118/68 | HR 82 | Ht 74.0 in | Wt 218.0 lb

## 2021-07-17 DIAGNOSIS — I2510 Atherosclerotic heart disease of native coronary artery without angina pectoris: Secondary | ICD-10-CM | POA: Insufficient documentation

## 2021-07-17 DIAGNOSIS — I700 Atherosclerosis of aorta: Secondary | ICD-10-CM | POA: Diagnosis not present

## 2021-07-17 DIAGNOSIS — D472 Monoclonal gammopathy: Secondary | ICD-10-CM

## 2021-07-17 DIAGNOSIS — R0609 Other forms of dyspnea: Secondary | ICD-10-CM

## 2021-07-17 NOTE — Patient Instructions (Signed)
Medication Instructions:  Your physician recommends that you continue on your current medications as directed. Please refer to the Current Medication list given to you today.  *If you need a refill on your cardiac medications before your next appointment, please call your pharmacy*   Lab Work: TODAY: BMET, BNP, FLP  If you have labs (blood work) drawn today and your tests are completely normal, you will receive your results only by: [place] (if you have MyChart) OR A paper copy in the mail If you have any lab test that is abnormal or we need to change your treatment, we will call you to review the results.   Testing/Procedures: Your physician has requested that you have an echocardiogram. Echocardiography is a painless test that uses sound waves to create images of your heart. It provides your doctor with information about the size and shape of your heart and how well your heart's chambers and valves are working. This procedure takes approximately one hour. There are no restrictions for this procedure.   Follow-Up: At [facility], you and your health needs are our priority.  As part of our continuing mission to provide you with exceptional heart care, we have created designated Provider Care Teams.  These Care Teams include your primary Cardiologist (physician) and Advanced Practice Providers (APPs -  Physician Assistants and Nurse Practitioners) who all work together to provide you with the care you need, when you need it.   Your next appointment:   6 month(s)  The format for your next appointment:   In Person  Provider:   You may see Werner Lean, MD or one of the following Advanced Practice Providers on your designated Care Team:   Melina Copa, PA-C Ermalinda Barrios, PA-C

## 2021-07-18 LAB — LIPID PANEL
Chol/HDL Ratio: 2.1 ratio (ref 0.0–5.0)
Cholesterol, Total: 121 mg/dL (ref 100–199)
HDL: 57 mg/dL (ref >39)
LDL Chol Calc (NIH): 45 mg/dL (ref 0–99)
Triglycerides: 104 mg/dL (ref 0–149)
VLDL Cholesterol Cal: 19 mg/dL (ref 5–40)

## 2021-07-18 LAB — BASIC METABOLIC PANEL
BUN/Creatinine Ratio: 12 (ref 10–24)
BUN: 14 mg/dL (ref 8–27)
CO2: 22 mmol/L (ref 20–29)
Calcium: 9 mg/dL (ref 8.6–10.2)
Chloride: 106 mmol/L (ref 96–106)
Creatinine, Ser: 1.18 mg/dL (ref 0.76–1.27)
Glucose: 88 mg/dL (ref 70–99)
Potassium: 4.5 mmol/L (ref 3.5–5.2)
Sodium: 140 mmol/L (ref 134–144)
eGFR: 63 mL/min/{1.73_m2} (ref >59)

## 2021-07-18 LAB — PRO B NATRIURETIC PEPTIDE: NT-Pro BNP: 133 pg/mL (ref 0–486)

## 2021-08-07 ENCOUNTER — Ambulatory Visit (HOSPITAL_COMMUNITY): Payer: Medicare Other | Attending: Internal Medicine

## 2021-08-07 ENCOUNTER — Other Ambulatory Visit: Payer: Self-pay

## 2021-08-07 DIAGNOSIS — R0609 Other forms of dyspnea: Secondary | ICD-10-CM

## 2021-08-07 LAB — ECHOCARDIOGRAM COMPLETE
Area-P 1/2: 3.6 cm2
S' Lateral: 3.5 cm

## 2021-08-08 ENCOUNTER — Telehealth: Payer: Self-pay

## 2021-08-08 DIAGNOSIS — R0609 Other forms of dyspnea: Secondary | ICD-10-CM

## 2021-08-08 DIAGNOSIS — I10 Essential (primary) hypertension: Secondary | ICD-10-CM

## 2021-08-08 MED ORDER — FUROSEMIDE 20 MG PO TABS: 20.0000 mg | ORAL_TABLET | Freq: Every day | ORAL | 11 refills | Status: DC

## 2021-08-08 NOTE — Telephone Encounter (Signed)
Called pt, reviewed results and MD recommendations.  Pt expresses continued SOB and is agreeable to trial furosemide 20 mg PO QD and follow up labs scheduled for 08/23/21.  All questions answered.

## 2021-08-08 NOTE — Telephone Encounter (Signed)
-----   Message from Werner Lean, MD sent at 08/07/2021  1:49 PM EST ----- Results: Heart squeeze is low normal Plan: If SOB is not better trial of lasix 20 mg PO daily and labs in two weeks  Werner Lean, MD

## 2021-08-23 ENCOUNTER — Other Ambulatory Visit: Payer: Medicare Other | Admitting: *Deleted

## 2021-08-23 ENCOUNTER — Other Ambulatory Visit: Payer: Self-pay

## 2021-08-23 DIAGNOSIS — I10 Essential (primary) hypertension: Secondary | ICD-10-CM

## 2021-08-23 LAB — BASIC METABOLIC PANEL
BUN/Creatinine Ratio: 11 (ref 10–24)
BUN: 15 mg/dL (ref 8–27)
CO2: 21 mmol/L (ref 20–29)
Calcium: 8.6 mg/dL (ref 8.6–10.2)
Chloride: 109 mmol/L — ABNORMAL HIGH (ref 96–106)
Creatinine, Ser: 1.35 mg/dL — ABNORMAL HIGH (ref 0.76–1.27)
Glucose: 143 mg/dL — ABNORMAL HIGH (ref 70–99)
Potassium: 3.9 mmol/L (ref 3.5–5.2)
Sodium: 143 mmol/L (ref 134–144)
eGFR: 54 mL/min/{1.73_m2} — ABNORMAL LOW (ref >59)

## 2021-11-10 ENCOUNTER — Other Ambulatory Visit (INDEPENDENT_AMBULATORY_CARE_PROVIDER_SITE_OTHER): Payer: Medicare Other

## 2021-11-10 ENCOUNTER — Ambulatory Visit: Payer: Medicare Other | Admitting: Gastroenterology

## 2021-11-10 ENCOUNTER — Telehealth: Payer: Self-pay | Admitting: Gastroenterology

## 2021-11-10 ENCOUNTER — Encounter: Payer: Self-pay | Admitting: Gastroenterology

## 2021-11-10 VITALS — BP 128/62 | HR 93 | Ht 74.0 in | Wt 220.0 lb

## 2021-11-10 DIAGNOSIS — R0602 Shortness of breath: Secondary | ICD-10-CM | POA: Diagnosis not present

## 2021-11-10 DIAGNOSIS — R12 Heartburn: Secondary | ICD-10-CM

## 2021-11-10 DIAGNOSIS — D509 Iron deficiency anemia, unspecified: Secondary | ICD-10-CM

## 2021-11-10 DIAGNOSIS — R1319 Other dysphagia: Secondary | ICD-10-CM | POA: Diagnosis not present

## 2021-11-10 DIAGNOSIS — K219 Gastro-esophageal reflux disease without esophagitis: Secondary | ICD-10-CM

## 2021-11-10 LAB — CBC WITH DIFFERENTIAL/PLATELET
Basophils Absolute: 0 10*3/uL (ref 0.0–0.1)
Basophils Relative: 0.8 % (ref 0.0–3.0)
Eosinophils Absolute: 0.3 10*3/uL (ref 0.0–0.7)
Eosinophils Relative: 5.7 % — ABNORMAL HIGH (ref 0.0–5.0)
HCT: 28.7 % — ABNORMAL LOW (ref 39.0–52.0)
Hemoglobin: 8.8 g/dL — ABNORMAL LOW (ref 13.0–17.0)
Lymphocytes Relative: 17.2 % (ref 12.0–46.0)
Lymphs Abs: 0.9 10*3/uL (ref 0.7–4.0)
MCHC: 30.8 g/dL (ref 30.0–36.0)
MCV: 85.2 fl (ref 78.0–100.0)
Monocytes Absolute: 1.1 10*3/uL — ABNORMAL HIGH (ref 0.1–1.0)
Monocytes Relative: 22.1 % — ABNORMAL HIGH (ref 3.0–12.0)
Neutro Abs: 2.7 10*3/uL (ref 1.4–7.7)
Neutrophils Relative %: 54.2 % (ref 43.0–77.0)
Platelets: 269 10*3/uL (ref 150.0–400.0)
RBC: 3.37 Mil/uL — ABNORMAL LOW (ref 4.22–5.81)
RDW: 14.9 % (ref 11.5–15.5)
WBC: 5.1 10*3/uL (ref 4.0–10.5)

## 2021-11-10 NOTE — Patient Instructions (Addendum)
If you are age 79 or older, your body mass index should be between 23-30. Your Body mass index is 28.25 kg/m. If this is out of the aforementioned range listed, please consider follow up with your Primary Care Provider.  If you are age 62 or younger, your body mass index should be between 19-25. Your Body mass index is 28.25 kg/m. If this is out of the aformentioned range listed, please consider follow up with your Primary Care Provider.   ________________________________________________________  The [facility] providers would like to encourage you to use [facility] to communicate with providers for non-urgent requests or questions.  Due to long hold times on the telephone, sending your provider a message by [facility] may be a faster and more efficient way to get a response.  Please allow 48 business hours for a response.  Please remember that this is for non-urgent requests.  _______________________________________________________  Your provider has requested that you go to the basement level for lab work before leaving today. Press "B" on the elevator. The lab is located at the first door on the left as you exit the elevator.  You have been scheduled for an endoscopy. Please follow written instructions given to you at your visit today. If you use inhalers (even only as needed), please bring them with you on the day of your procedure.  It was a pleasure to see you today!  Thank you for trusting me with your gastrointestinal care!

## 2021-11-10 NOTE — Telephone Encounter (Signed)
Thank you for the update.  I also routed this patient's note to Dr. Learta Codding of hematology, who saw this patient for anemia years ago.  I asked for his help evaluating and managing this patient's anemia.  HD

## 2021-11-10 NOTE — Telephone Encounter (Signed)
[name],  Please see my office note from this morning's visit for details.  This patient came to see me for heartburn and upper digestive symptoms, but he also noted at least several months of shortness of breath.  He has been evaluated by cardiology a few months back but I noted he had not had a CBC for some time.   Today's blood work shows he is significantly anemic, though I do not yet know why.    This needs immediate attention from his primary care provider and he will need additional laboratory studies as well as possibly referral to hematologist.  Please fax these labs and my note to Dr. Loren Racer office (this patient's PCP), and then call their office and speak with clinical staff so they can get this message to that physician and arrange additional lab work with them either today or early next week.  Depending on the outcome of that work-up and what ever other testing may be needed, we may then need to reconsider the timing of his endoscopy.  - HD

## 2021-11-10 NOTE — Progress Notes (Signed)
[facility] Consult Note:  History: [name] 11/10/2021  Referring provider: Haywood Pao, MD  Reason for consult/chief complaint: Heartburn (X 2 months)   Subjective  HPI: Last seen by me March 2020 for surveillance colonoscopy, diminutive polyp, diverticulosis, no recall recommended.  [name] contacted [place] asking for evaluation of symptoms that have been occurring for the last several months.  He has experienced a lower sternal burning discomfort nearly every day, often after meals and more so in the evening or overnight.  He has had a few episodes of waterbrash and regurgitation.  He recently noticed it when he was sitting in his chair watching TV after supper and he has had a few episodes waking him from sleep.  He took a week or 2 of Nexium with relief, then switch to OTC Prilosec and also had improvement.  He stopped taking it because it recommended seeing a physician after the 14-day course.  He sometimes feels that food feels "strange" in the upper chest, though not quite clear if it is dysphagia.  Denies vomiting or weight loss, though symptoms have steadily worsened over the last few months until he took a course of the Prilosec, which she is no longer taking.  ROS:  Review of Systems  Constitutional:  Negative for appetite change and unexpected weight change.  HENT:  Negative for mouth sores and voice change.   Eyes:  Negative for pain and redness.  Respiratory:  Positive for shortness of breath. Negative for cough.   Cardiovascular:  Negative for chest pain and palpitations.  Genitourinary:  Negative for dysuria and hematuria.  Musculoskeletal:  Negative for arthralgias and myalgias.  Skin:  Negative for pallor and rash.  Neurological:  Negative for weakness and headaches.  Hematological:  Negative for adenopathy.  He continues to have intermittent dyspnea with exertion.  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 Beryle Beams   Lumbar pain    HX O PAIN LEFT LEG AND XRAYS BACK IN 2005 OR 2006 AND TOLD DISC RUBBING ON NERVE - GIVEN INJECTIONS LOWER BACK - DIDN'T SOLVE THE PAIN DOWN LEFT LEG   Lymphadenopathy, axillary 04/07/2013   3 cm right axillary node S/P BIOPSY - NOT MALIGNANT   MGUS (monoclonal gammopathy of unknown significance) 01/10/2016   Sickle cell trait (HCC)    Wears glasses    Wears partial dentures    top and bottom partials     Past Surgical History: Past Surgical History:  Procedure Laterality Date   APPENDECTOMY     COLONOSCOPY     ENTEROSCOPY  08/24/2011   Procedure: ENTEROSCOPY;  Surgeon: Inda Castle, MD;  Location: [facility];  Service: Endoscopy;  Laterality: N/A;   HERNIA REPAIR     rt ing   MASS EXCISION Right 09/01/2013   Procedure: EXCISION AXILLA  MASS;  Surgeon: Joyice Faster. Cornett,  MD;  Location: [facility];  Service: General;  Laterality: Right;   TOTAL HIP ARTHROPLASTY Left 12/25/2013   Procedure: LEFT TOTAL HIP ARTHROPLASTY ANTERIOR APPROACH;  Surgeon: Mcarthur Rossetti, MD;  Location: [facility];  Service: Orthopedics;  Laterality: Left;   TOTAL HIP ARTHROPLASTY Right 09/02/2020   Procedure: RIGHT TOTAL HIP ARTHROPLASTY ANTERIOR APPROACH;  Surgeon: Mcarthur Rossetti, MD;  Location: [facility];  Service: Orthopedics;  Laterality: Right;     Family History: Family History  Problem Relation Age of Onset   Colon cancer Neg Hx     Esophageal cancer Neg Hx    Rectal cancer Neg Hx    Stomach cancer Neg Hx     Social History: Social History   Socioeconomic History   Marital status: Married    Spouse name: Not on file   Number of children: 1   Years of education: Not on file   Highest education level: Not on file  Occupational History   Occupation: Retired     Fish farm manager: RETIRED  Tobacco Use   Smoking status: Former    Types: Cigarettes    Quit date: 08/27/1992    Years since quitting: 29.2   Smokeless tobacco: Never  Vaping Use   Vaping Use: Never used  Substance and Sexual Activity   Alcohol use: Yes    Alcohol/week: 0.0 standard drinks    Comment: Gin and beer every other day   Drug use: No   Sexual activity: Not on file  Other Topics Concern   Not on file  Social History Narrative   Not on file   Social Determinants of Health   Financial Resource Strain: Not on file  Food Insecurity: Not on file  Transportation Needs: Not on file  Physical Activity: Not on file  Stress: Not on file  Social Connections: Not on file    Allergies: No Known Allergies  Outpatient Meds: Current Outpatient Medications  Medication Sig Dispense Refill   allopurinol (ZYLOPRIM) 100 MG tablet Take 100 mg by mouth daily.     aspirin EC 81 MG tablet Take 81 mg by mouth daily. Swallow whole.     atenolol (TENORMIN) 25 MG tablet Take 25 mg by mouth every morning.     atorvastatin (LIPITOR) 40 MG tablet Take 1 tablet (40 mg total) by mouth daily. 90 tablet 3   Cinnamon 500 MG capsule Take 500 mg by mouth daily.     ferrous sulfate 325 (65 FE) MG tablet Take 325 mg by mouth 2 (two) times daily with a meal.     furosemide (LASIX) 20 MG tablet Take 1 tablet (20 mg total) by mouth daily. 30 tablet 11   metoprolol tartrate (LOPRESSOR) 100 MG tablet Take 2 hours before Cardiac CT 1 tablet 0   Multiple Vitamin (MULTIVITAMIN WITH MINERALS) TABS tablet Take 1 tablet by mouth daily. Centrum Silver     naproxen sodium (ALEVE)  220 MG tablet Take 220 mg by mouth daily as needed (Pain).     vitamin C (ASCORBIC ACID) 500 MG tablet Take 500 mg by mouth daily.     Zinc Acetate, Oral, (ZINC ACETATE PO) Take 1 tablet by mouth daily.      No current facility-administered medications for this visit.      ___________________________________________________________________ Objective   Exam:  BP 128/62 (BP Location: Left Arm, Patient Position: Sitting, Cuff Size: Normal)    Pulse 93    Ht 6\' 2"  (1.88 m)    Wt 220  lb (99.8 kg)    SpO2 98%    BMI 28.25 kg/m  Wt Readings from Last 3 Encounters:  11/10/21 220 lb (99.8 kg)  07/17/21 218 lb (98.9 kg)  01/16/21 219 lb (99.3 kg)    General: Well-appearing, normal vocal quality Eyes: sclera anicteric, no redness -conjunctiva are pale ENT: oral mucosa moist without lesions, no cervical or supraclavicular lymphadenopathy CV: RRR without murmur, S1/S2, no JVD, no peripheral edema Resp: clear to auscultation bilaterally, normal RR and effort noted GI: soft, no tenderness, with active bowel sounds. No guarding or palpable organomegaly noted. Skin; warm and dry, no rash or jaundice noted Neuro: awake, alert and oriented x 3. Normal gross motor function and fluent speech  Labs:  No Epic CBC since 08/2020 until one was ordered at today's office visit.  CMP Latest Ref Rng & Units 08/23/2021 07/17/2021 02/03/2021  Glucose 70 - 99 mg/dL 143(H) 88 116(H)  BUN 8 - 27 mg/dL 15 14 14   Creatinine 0.76 - 1.27 mg/dL 1.35(H) 1.18 1.31(H)  Sodium 134 - 144 mmol/L 143 140 139  Potassium 3.5 - 5.2 mmol/L 3.9 4.5 4.3  Chloride 96 - 106 mmol/L 109(H) 106 105  CO2 20 - 29 mmol/L 21 22 21   Calcium 8.6 - 10.2 mg/dL 8.6 9.0 9.2  Total Protein 6.0 - 8.5 g/dL - - -  Total Bilirubin 0.0 - 1.2 mg/dL - - -  Alkaline Phos 39 - 117 IU/L - - -  AST 0 - 40 IU/L - - -  ALT 0 - 44 IU/L - - -   CBC Latest Ref Rng & Units 11/10/2021 09/03/2020 08/29/2020  WBC 4.0 - 10.5 K/uL 5.1 8.3 3.9(L)  Hemoglobin  13.0 - 17.0 g/dL 8.8 Repeated and verified X2.(L) 11.2(L) 13.9  Hematocrit 39.0 - 52.0 % 28.7(L) 34.2(L) 42.7  Platelets 150.0 - 400.0 K/uL 269.0 141(L) 173     Radiologic Studies:  1. Inferior basal hypokinesis and abnormal GLS noted in inferior base as  well . Left ventricular ejection fraction, by estimation, is 55%. The left  ventricle has normal function. The left ventricle demonstrates regional  wall motion abnormalities (see  scoring diagram/findings for description). There is mild left ventricular  hypertrophy. Left ventricular diastolic parameters were normal.   2. Right ventricular systolic function is normal. The right ventricular  size is normal. There is normal pulmonary artery systolic pressure.   3. Left atrial size was moderately dilated.   4. The mitral valve is normal in structure. Trivial mitral valve  regurgitation. No evidence of mitral stenosis.   5. The aortic valve was not well visualized. There is mild calcification  of the aortic valve. Aortic valve regurgitation is not visualized. Aortic  valve sclerosis is present, with no evidence of aortic valve stenosis.   6. The inferior vena cava is normal in size with greater than 50%  respiratory variability, suggesting right atrial pressure of 3 mmHg.    Assessment: Encounter Diagnoses  Name Primary?   Heartburn Yes   Esophageal dysphagia    Shortness of breath     A few months of worsening heartburn recurring despite PPI therapy. We discussed the limitation of acid suppression therapy, breadth of diet and lifestyle changes required for reflux control, the possibility of reflux related complications such as esophagitis, stricture or Barrett's esophagus.  Other anatomic considerations such as gastric outlet obstruction or hiatal hernia may contribute to reflux.  Additional symptoms that may be dysphagia, therefore endoscopic evaluation is warranted.  He was  agreeable to an upper endoscopy after discussion of  procedure and risks.  The benefits and risks of the planned procedure were described in detail with the patient or (when appropriate) their health care proxy.  Risks were outlined as including, but not limited to, bleeding, infection, perforation, adverse medication reaction leading to cardiac or pulmonary decompensation, pancreatitis (if ERCP).  The limitation of incomplete mucosal visualization was also discussed.  No guarantees or warranties were given.   Plan:  I also sent him for a CBC because of his dyspnea.  He believes he saw primary care several months ago, but if so we do not have those records, and he has not had a CBC in this EMR for well over a year. The CBC result returned after the patient's office visit today and shows he is significantly anemic. Though he has a history of endoscopic work-up for iron deficiency anemia when seen by Dr. Deatra Ina years ago, on today's CBC he has a normal MCV and RDW as well as an elevated peripheral monocyte and slightly elevated eosinophil count.  We will contact him today and recommend that he see his primary care doctor early next week for additional lab work-up such as iron studies, T70, folic acid with consideration of hematology referral if felt to be appropriate.  My nurse will contact this patient's primary care office later today so Dr. Osborne Casco can be notified and we will fax this result over to their office for immediate attention. Depending on the timing and outcome of that work-up, this patient's upper endoscopy timing may need to be reconsidered.  In addition to the above and the EGD, he will also resume OTC Prilosec 20 mg before his supper meal. Diet and lifestyle measures to control reflux were also reviewed, particularly meal timing and portion size, especially because he has had evening and overnight symptoms.  Thank you for the courtesy of this consult.  Please call me with any questions or concerns.  Nelida Meuse III  CC: Referring  provider noted above

## 2021-11-10 NOTE — Telephone Encounter (Signed)
Attempted to reach Dr. Loren Racer office, I held for 10 minutes and was not able to speak with anyone directly. Will attempt to route to Dr. Osborne Casco via epic.   Labs and office note from today faxed to Dr. Osborne Casco at [phone].

## 2021-11-14 ENCOUNTER — Other Ambulatory Visit: Payer: Self-pay | Admitting: *Deleted

## 2021-11-14 ENCOUNTER — Telehealth: Payer: Self-pay | Admitting: Oncology

## 2021-11-14 DIAGNOSIS — D472 Monoclonal gammopathy: Secondary | ICD-10-CM

## 2021-11-14 NOTE — Telephone Encounter (Signed)
Received a return call from [facility], Dr. Loren Racer RMA. I provided her with the patient's history and gave her verbal results of patient's CBC that was drawn on 2/24. Estill Bamberg states that they do not have access to epic. She states that she will get the results to Dr. Osborne Casco and see how he would like to proceed. She is aware of patient's upcoming appts (EGD and hematology appts). I provided Estill Bamberg with my direct office number.

## 2021-11-14 NOTE — Telephone Encounter (Signed)
Patient is scheduled to see Dr. Benay Spice on 11/28/21.

## 2021-11-14 NOTE — Telephone Encounter (Signed)
Contacted patient to schedule initial visit from referral. Patient is aware of dates and times.

## 2021-11-14 NOTE — Telephone Encounter (Signed)
Estill Bamberg, RMA gave me a call back regarding mutual patient. She states that Dr. Osborne Casco stated that they would not be able to workup anemia at this time without a functional office and lab. He defers to hematology at this time.
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
## 2021-11-14 NOTE — Progress Notes (Signed)
Referral received from Dr Loletha Carrow, referral entry completed. Chart under review for scheduling, will contact patient 11/14/21 for upcoming appt.

## 2021-11-14 NOTE — Progress Notes (Signed)
New patient appt for 3/14 with Dr Benay Spice and labs day before, lab orders entered

## 2021-11-14 NOTE — Telephone Encounter (Signed)
I might need to delay this patient's upper endoscopy until his anemia is addressed.  Please contact [name] and find out whether or not he has heard from Dr. Loren Racer office about some lab tests and a visit regarding the anemia.  If not, he needs to call them today so it can be attended to this week.  And then I need his primary care doctor send those lab results to me ASAP.  Please call Dr. Loren Racer office again to help expedite this if you can.  Also, please get a barium swallow this week for GERD and dysphagia so I can make sure there is not an urgent or high risk finding, which will also help me determine the appropriate timing of his upper endoscopy.  Keep the March 9 Crucible slot for now.  - HD

## 2021-11-14 NOTE — Addendum Note (Signed)
Addended by: Yevette Edwards on: 11/14/2021 12:54 PM   Modules accepted: Orders

## 2021-11-15 ENCOUNTER — Other Ambulatory Visit: Payer: Self-pay

## 2021-11-15 ENCOUNTER — Other Ambulatory Visit (INDEPENDENT_AMBULATORY_CARE_PROVIDER_SITE_OTHER): Payer: Medicare Other

## 2021-11-15 DIAGNOSIS — R1319 Other dysphagia: Secondary | ICD-10-CM | POA: Diagnosis not present

## 2021-11-15 DIAGNOSIS — K219 Gastro-esophageal reflux disease without esophagitis: Secondary | ICD-10-CM

## 2021-11-15 DIAGNOSIS — D509 Iron deficiency anemia, unspecified: Secondary | ICD-10-CM | POA: Diagnosis not present

## 2021-11-15 DIAGNOSIS — R12 Heartburn: Secondary | ICD-10-CM | POA: Diagnosis not present

## 2021-11-15 LAB — CBC WITH DIFFERENTIAL/PLATELET
Basophils Absolute: 0 10*3/uL (ref 0.0–0.1)
Basophils Relative: 0.9 % (ref 0.0–3.0)
Eosinophils Absolute: 0.2 10*3/uL (ref 0.0–0.7)
Eosinophils Relative: 5.2 % — ABNORMAL HIGH (ref 0.0–5.0)
HCT: 28.4 % — ABNORMAL LOW (ref 39.0–52.0)
Hemoglobin: 8.7 g/dL — ABNORMAL LOW (ref 13.0–17.0)
Lymphocytes Relative: 19.8 % (ref 12.0–46.0)
Lymphs Abs: 0.9 10*3/uL (ref 0.7–4.0)
MCHC: 30.8 g/dL (ref 30.0–36.0)
MCV: 85.3 fl (ref 78.0–100.0)
Monocytes Absolute: 0.8 10*3/uL (ref 0.1–1.0)
Monocytes Relative: 18.1 % — ABNORMAL HIGH (ref 3.0–12.0)
Neutro Abs: 2.6 10*3/uL (ref 1.4–7.7)
Neutrophils Relative %: 56 % (ref 43.0–77.0)
Platelets: 268 10*3/uL (ref 150.0–400.0)
RBC: 3.32 Mil/uL — ABNORMAL LOW (ref 4.22–5.81)
RDW: 15.2 % (ref 11.5–15.5)
WBC: 4.7 10*3/uL (ref 4.0–10.5)

## 2021-11-15 LAB — IBC + FERRITIN
Ferritin: 15.3 ng/mL — ABNORMAL LOW (ref 22.0–322.0)
Iron: 14 ug/dL — ABNORMAL LOW (ref 42–165)
Saturation Ratios: 3.8 % — ABNORMAL LOW (ref 20.0–50.0)
TIBC: 371 ug/dL (ref 250.0–450.0)
Transferrin: 265 mg/dL (ref 212.0–360.0)

## 2021-11-15 LAB — VITAMIN B12: Vitamin B-12: 631 pg/mL (ref 211–911)

## 2021-11-15 LAB — RETICULOCYTES
ABS Retic: 174720 cells/uL — ABNORMAL HIGH (ref 25000–90000)
Retic Ct Pct: 5.2 %

## 2021-11-15 LAB — FOLATE: Folate: >24.2 ng/mL (ref >5.9)

## 2021-11-15 MED ORDER — FERROUS SULFATE 325 (65 FE) MG PO TABS: 325.0000 mg | ORAL_TABLET | Freq: Three times a day (TID) | ORAL | 0 refills | Status: AC

## 2021-11-15 NOTE — Addendum Note (Signed)
Addended by: Yevette Edwards on: 11/15/2021 09:01 AM ? ? Modules accepted: Orders ? ?

## 2021-11-15 NOTE — Telephone Encounter (Signed)
Called and spoke with patient. He has been advised to come by the lab today or tomorrow. He knows that we are assessing his anemia and will need these labs back prior to his procedure on 11/23/21. Pt verbalized understanding and had no concerns at the end of the call. ? ?Lab orders in epic. ?
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
## 2021-11-16 ENCOUNTER — Ambulatory Visit (HOSPITAL_COMMUNITY)
Admission: RE | Admit: 2021-11-16 | Discharge: 2021-11-16 | Disposition: A | Discharge status: Home / Self Care | Payer: Medicare Other | Source: Ambulatory Visit | Attending: Gastroenterology | Admitting: Gastroenterology

## 2021-11-16 ENCOUNTER — Other Ambulatory Visit: Payer: Self-pay

## 2021-11-16 ENCOUNTER — Other Ambulatory Visit: Payer: Self-pay | Admitting: Gastroenterology

## 2021-11-16 DIAGNOSIS — K219 Gastro-esophageal reflux disease without esophagitis: Secondary | ICD-10-CM | POA: Diagnosis present

## 2021-11-16 DIAGNOSIS — R1319 Other dysphagia: Secondary | ICD-10-CM

## 2021-11-16 DIAGNOSIS — R12 Heartburn: Secondary | ICD-10-CM

## 2021-11-23 ENCOUNTER — Other Ambulatory Visit: Payer: Self-pay

## 2021-11-23 ENCOUNTER — Encounter: Payer: Self-pay | Admitting: Gastroenterology

## 2021-11-23 ENCOUNTER — Ambulatory Visit (AMBULATORY_SURGERY_CENTER): Payer: Medicare Other | Admitting: Gastroenterology

## 2021-11-23 ENCOUNTER — Telehealth: Payer: Self-pay

## 2021-11-23 VITALS — BP 114/56 | HR 76 | Temp 98.9°F | Resp 15 | Ht 74.0 in | Wt 220.0 lb

## 2021-11-23 DIAGNOSIS — R12 Heartburn: Secondary | ICD-10-CM

## 2021-11-23 DIAGNOSIS — D509 Iron deficiency anemia, unspecified: Secondary | ICD-10-CM

## 2021-11-23 DIAGNOSIS — K5520 Angiodysplasia of colon without hemorrhage: Secondary | ICD-10-CM

## 2021-11-23 DIAGNOSIS — R1319 Other dysphagia: Secondary | ICD-10-CM

## 2021-11-23 DIAGNOSIS — K2970 Gastritis, unspecified, without bleeding: Secondary | ICD-10-CM | POA: Diagnosis not present

## 2021-11-23 DIAGNOSIS — K2950 Unspecified chronic gastritis without bleeding: Secondary | ICD-10-CM

## 2021-11-23 MED ORDER — SODIUM CHLORIDE 0.9 % IV SOLN: 500.0000 mL | Freq: Once | INTRAVENOUS | Status: DC

## 2021-11-23 NOTE — Progress Notes (Signed)
Late entry: ? ?1051 Robinul 0.1 mg IV given due large amount of secretions upon assessment.  MD made aware, vss  ?

## 2021-11-23 NOTE — Progress Notes (Signed)
Report given to PACU, vss 

## 2021-11-23 NOTE — Telephone Encounter (Signed)
Patient has been scheduled for an EGD at [facility] on 12/12/21 at 11 am. Pt will need to arrive at [facility] by 9:30 am with a care partner. Pt is aware that I will mail him his instructions today.  ? ?Ambulatory referral to GI in epic. ?

## 2021-11-23 NOTE — Progress Notes (Signed)
VS by CW  Pt's states no medical or surgical changes since previsit or office visit.  

## 2021-11-23 NOTE — Progress Notes (Signed)
1106 Ephedrine 10 mg given IV due to low BP, MD updated.   ?

## 2021-11-23 NOTE — Op Note (Signed)
[place] ?Patient Name: [name] ?Procedure Date: 11/23/2021 10:49 AM ?MRN: [id number] ?Endoscopist: Estill Cotta. Loletha Carrow , MD ?Age: 81 ?Referring MD:  ?Date of Birth: 30-Jun-1941 ?Gender: Male ?Account #: [id number] ?Procedure:                Upper GI endoscopy ?Indications:              Iron deficiency anemia, Esophageal dysphagia,  ?                          Heartburn ?Medicines:                Monitored Anesthesia Care ?Procedure:                Pre-Anesthesia Assessment: ?                          - Prior to the procedure, a History and Physical  ?                          was performed, and patient medications and  ?                          allergies were reviewed. The patient's tolerance of  ?                          previous anesthesia was also reviewed. The risks  ?                          and benefits of the procedure and the sedation  ?                          options and risks were discussed with the patient.  ?                          All questions were answered, and informed consent  ?                          was obtained. Prior Anticoagulants: The patient has  ?                          taken no previous anticoagulant or antiplatelet  ?                          agents. ASA Grade Assessment: II - A patient with  ?                          mild systemic disease. After reviewing the risks  ?                          and benefits, the patient was deemed in  ?                          satisfactory condition to undergo the procedure. ?  After obtaining informed consent, the endoscope was  ?                          passed under direct vision. Throughout the  ?                          procedure, the patient's blood pressure, pulse, and  ?                          oxygen saturations were monitored continuously. The  ?                          Endoscope was introduced through the mouth, and  ?                          advanced to the third part of duodenum. The upper  ?                           GI endoscopy was accomplished without difficulty.  ?                          The patient tolerated the procedure well. ?Scope In: ?Scope Out: ?Findings:                 The larynx was normal. ?                          There is no endoscopic evidence of Barrett's  ?                          esophagus, esophagitis or stricture in the entire  ?                          esophagus. LES somewhat patulous. ?                          Diffuse edematous/cobblestoned mucosa was found in  ?                          the gastric body with a few diminutive erythematous  ?                          areas. Several biopsies were obtained on the  ?                          greater curvature of the gastric body, on the  ?                          lesser curvature of the gastric body, on the  ?                          greater curvature of the gastric antrum and on the  ?  lesser curvature of the gastric antrum with cold  ?                          forceps for histology (rule out H pylori). ?                          The exam of the stomach was otherwise normal. ?                          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                          duodenal bulb. It had a central erosion. There was  ?                          also scant adjacent fresh heme, lavaged with no  ?                          ongoing bleeding. ?                          The exam of the duodenum was otherwise normal. ?Complications:            No immediate complications. ?Estimated Blood Loss:     Estimated blood loss was minimal. ?Impression:               - Normal larynx. ?                          - Mildly edematous gastric mucosa.( Nonspecific  ?                          finding ) ?                          - A single angioectasia in the duodenum. ?                          - Several biopsies were obtained on the greater  ?                          curvature of the gastric body, on the lesser  ?                           curvature of the gastric body, on the greater  ?                          curvature of the gastric antrum and on the lesser  ?                          curvature of the gastric antrum. ?Recommendation:           - Patient has a contact number available for  ?  emergencies. The signs and symptoms of potential  ?                          delayed complications were discussed with the  ?                          patient. Return to normal activities tomorrow.  ?                          Written discharge instructions were provided to the  ?                          patient. ?                          - Resume previous diet. ?                          - Continue present medications, including once  ?                          daily omeprazole and twice daily iron supplement. ?                          - Await pathology results. ?                          - Repeat upper endoscopy within several weeks at  ?                          hospital outpatient endoscopy dept for APC of AVM. ?                          - See Hematology as scheduled. IV iron may be  ?                          needed. ?                          - Follow an antireflux regimen indefinitely. ?Kymber Kosar L. Loletha Carrow, MD ?11/23/2021 11:16:50 AM ?This report has been signed electronically. ?

## 2021-11-23 NOTE — Patient Instructions (Addendum)
Handout given for GERD, Anti-reflux measures. ? ? ?YOU HAD AN ENDOSCOPIC PROCEDURE TODAY AT [facility]:   Refer to the procedure report that was given to you for any specific questions about what was found during the examination.  If the procedure report does not answer your questions, please call your gastroenterologist to clarify.  If you requested that your care partner not be given the details of your procedure findings, then the procedure report has been included in a sealed envelope for you to review at your convenience later. ? ?YOU SHOULD EXPECT: Some feelings of bloating in the abdomen. Passage of more gas than usual.  Walking can help get rid of the air that was put into your GI tract during the procedure and reduce the bloating. If you had a lower endoscopy (such as a colonoscopy or flexible sigmoidoscopy) you may notice spotting of blood in your stool or on the toilet paper. If you underwent a bowel prep for your procedure, you may not have a normal bowel movement for a few days. ? ?Please Note:  You might notice some irritation and congestion in your nose or some drainage.  This is from the oxygen used during your procedure.  There is no need for concern and it should clear up in a day or so. ? ?SYMPTOMS TO REPORT IMMEDIATELY: ? ?Following upper endoscopy (EGD) ? Vomiting of blood or coffee ground material ? New chest pain or pain under the shoulder blades ? Painful or persistently difficult swallowing ? New shortness of breath ? Fever of 100?F or higher ? Black, tarry-looking stools ? ?For urgent or emergent issues, a gastroenterologist can be reached at any hour by calling [phone]. ?Do not use MyChart messaging for urgent concerns.  ? ? ?DIET:  We do recommend a small meal at first, but then you may proceed to your regular diet.  Drink plenty of fluids but you should avoid alcoholic beverages for 24 hours. ? ?ACTIVITY:  You should plan to take it easy for the rest of today and  you should NOT DRIVE or use heavy machinery until tomorrow (because of the sedation medicines used during the test).   ? ?FOLLOW UP: ?Our staff will call the number listed on your records 48-72 hours following your procedure to check on you and address any questions or concerns that you may have regarding the information given to you following your procedure. If we do not reach you, we will leave a message.  We will attempt to reach you two times.  During this call, we will ask if you have developed any symptoms of COVID 19. If you develop any symptoms (ie: fever, flu-like symptoms, shortness of breath, cough etc.) before then, please call [phone].  If you test positive for Covid 19 in the 2 weeks post procedure, please call and report this information to [place].   ? ?If any biopsies were taken you will be contacted by phone or by letter within the next 1-3 weeks.  Please call us at [phone] if you have not heard about the biopsies in 3 weeks.  ? ? ?SIGNATURES/CONFIDENTIALITY: ?You and/or your care partner have signed paperwork which will be entered into your electronic medical record.  These signatures attest to the fact that that the information above on your After Visit Summary has been reviewed and is understood.  Full responsibility of the confidentiality of this discharge information lies with you and/or your care-partner.  ?

## 2021-11-23 NOTE — Progress Notes (Signed)
Called to room to assist during endoscopic procedure.  Patient ID and intended procedure confirmed with present staff. Received instructions for my participation in the procedure from the performing physician.  

## 2021-11-23 NOTE — Progress Notes (Signed)
No changes to clinical history since GI office visit on 11/10/21. ? ?The patient is appropriate for an endoscopic procedure in the ambulatory setting. ? ?- Wilfrid Lund, MD ? ? ? ?

## 2021-11-23 NOTE — Telephone Encounter (Signed)
-----   Message from Doran Stabler, MD sent at 11/23/2021  1:44 PM EST ----- ?[place], ? ? 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 ? ?He needs an upper endoscopy with me in the [facility] lab at the next available slot. ? ?The current schedule appears to show that I have a slot left on the morning of March 28. ? ?Please contact this patient today or tomorrow and put him in that slot before it is gone, then let me know. ? ?HD ?

## 2021-11-27 ENCOUNTER — Inpatient Hospital Stay: Payer: Medicare Other | Attending: Oncology

## 2021-11-27 ENCOUNTER — Other Ambulatory Visit: Payer: Self-pay

## 2021-11-27 DIAGNOSIS — D573 Sickle-cell trait: Secondary | ICD-10-CM | POA: Diagnosis not present

## 2021-11-27 DIAGNOSIS — I10 Essential (primary) hypertension: Secondary | ICD-10-CM | POA: Diagnosis not present

## 2021-11-27 DIAGNOSIS — K31811 Angiodysplasia of stomach and duodenum with bleeding: Secondary | ICD-10-CM | POA: Insufficient documentation

## 2021-11-27 DIAGNOSIS — D472 Monoclonal gammopathy: Secondary | ICD-10-CM | POA: Insufficient documentation

## 2021-11-27 DIAGNOSIS — Z96643 Presence of artificial hip joint, bilateral: Secondary | ICD-10-CM | POA: Insufficient documentation

## 2021-11-27 DIAGNOSIS — M109 Gout, unspecified: Secondary | ICD-10-CM | POA: Insufficient documentation

## 2021-11-27 DIAGNOSIS — E785 Hyperlipidemia, unspecified: Secondary | ICD-10-CM | POA: Diagnosis not present

## 2021-11-27 DIAGNOSIS — D500 Iron deficiency anemia secondary to blood loss (chronic): Secondary | ICD-10-CM | POA: Diagnosis present

## 2021-11-27 LAB — SAVE SMEAR(SSMR), FOR PROVIDER SLIDE REVIEW

## 2021-11-27 LAB — CBC WITH DIFFERENTIAL (CANCER CENTER ONLY)
Abs Immature Granulocytes: 0.02 10*3/uL (ref 0.00–0.07)
Basophils Absolute: 0 10*3/uL (ref 0.0–0.1)
Basophils Relative: 1 %
Eosinophils Absolute: 0.2 10*3/uL (ref 0.0–0.5)
Eosinophils Relative: 6 %
HCT: 28.8 % — ABNORMAL LOW (ref 39.0–52.0)
Hemoglobin: 8.5 g/dL — ABNORMAL LOW (ref 13.0–17.0)
Immature Granulocytes: 1 %
Lymphocytes Relative: 22 %
Lymphs Abs: 0.8 10*3/uL (ref 0.7–4.0)
MCH: 25.1 pg — ABNORMAL LOW (ref 26.0–34.0)
MCHC: 29.5 g/dL — ABNORMAL LOW (ref 30.0–36.0)
MCV: 85.2 fL (ref 80.0–100.0)
Monocytes Absolute: 0.7 10*3/uL (ref 0.1–1.0)
Monocytes Relative: 17 %
Neutro Abs: 2.1 10*3/uL (ref 1.7–7.7)
Neutrophils Relative %: 53 %
Platelet Count: 233 10*3/uL (ref 150–400)
RBC: 3.38 MIL/uL — ABNORMAL LOW (ref 4.22–5.81)
RDW: 14.5 % (ref 11.5–15.5)
WBC Count: 3.8 10*3/uL — ABNORMAL LOW (ref 4.0–10.5)
nRBC: 0 % (ref 0.0–0.2)

## 2021-11-28 ENCOUNTER — Inpatient Hospital Stay: Payer: Medicare Other | Admitting: Oncology

## 2021-11-28 VITALS — BP 150/59 | HR 90 | Temp 97.8°F | Resp 20 | Ht 74.0 in | Wt 220.8 lb

## 2021-11-28 DIAGNOSIS — Q2733 Arteriovenous malformation of digestive system vessel: Secondary | ICD-10-CM | POA: Diagnosis not present

## 2021-11-28 DIAGNOSIS — M109 Gout, unspecified: Secondary | ICD-10-CM

## 2021-11-28 DIAGNOSIS — D573 Sickle-cell trait: Secondary | ICD-10-CM

## 2021-11-28 DIAGNOSIS — D500 Iron deficiency anemia secondary to blood loss (chronic): Secondary | ICD-10-CM | POA: Diagnosis not present

## 2021-11-28 DIAGNOSIS — I10 Essential (primary) hypertension: Secondary | ICD-10-CM

## 2021-11-28 DIAGNOSIS — D472 Monoclonal gammopathy: Secondary | ICD-10-CM

## 2021-11-28 DIAGNOSIS — E785 Hyperlipidemia, unspecified: Secondary | ICD-10-CM

## 2021-11-28 DIAGNOSIS — Z96643 Presence of artificial hip joint, bilateral: Secondary | ICD-10-CM

## 2021-11-28 NOTE — Progress Notes (Signed)
?[facility] ?New Patient Consult ? ? ?Requesting MD: ?Doran Stabler, Md ?[address] ?[place],  [place] [address] ? ? ?[name] ?81 y.o.  ?03/05/1941 ? ?  ?Reason for Consult: Anemia, history of basilar monoclonal protein ? ? ?HPI: [name] has a remote history of iron deficiency anemia.  He has received multiple treatments with IV iron in the past.  He was referred to Dr. Loletha Carrow for evaluation of "heartburn".  On 11/10/2021 the hemoglobin returned at 8.8, MCV 85.2, platelets 269,000, WBC 5.1, and absolute neutrophil count 2.7. ? ?He reports taking iron twice daily for years.  Dr. Loletha Carrow increased iron to 3 times daily.  A repeat CBC on 11/15/2021 found the hemoglobin at 8.7 with a ferritin level of 15, iron 14, TIBC 371, percent transferrin saturation 3.8. ? ?He was taken to an upper endoscopy 11/23/2021.  Diffuse cobblestone mucosa was found in the gastric body.  A single small angioectasia was found in the duodenal bulb with a central erosion.  There was scant adjacent fresh heme.  The duodenum was otherwise normal.  He is being scheduled for repeat upper endoscopy for APC of the AVM.  He continues omeprazole.  Biopsies from the stomach revealed mild chronic gastritis.  No dysplasia or malignancy.  Negative H. pylori stain. ? ?He is referred to consider IV iron therapy. ? ?[name] was seen initially by Dr. Jamse Arn and then by Dr. Beryle Beams for iron deficiency anemia.  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  He was diagnosed with sickle cell trait.  A bone marrow biopsy 11/18/2008 revealed decreased iron stores.  Cytogenetics returned normal. ?He has been treated with intermittent IV iron therapy, last in May 2017. ? 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  He underwent laser ablation of AVMs. ? ?Past Medical History:  ?Diagnosis Date  ? Abnormal CT of the chest 04/16/13   ? Anemia   ? sees dr  Griffin Basil OF IRON DEFICIENCY ANEMIA FROM INTERMITTENT GI BLOOD LOSS RELATED TO SMALL BOWEL AVMS  ? 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 LEFT HIP - PT STATES HE WALKS WITH LIMP AND PAIN AT TIMES  ? AVM (arteriovenous malformation) of colon   ? HX OF GI BLOOD LOSS RELATED TO SMALL BOWEL AVM'S  ? GERD (gastroesophageal reflux disease)   ? Gout   ? Hyperlipemia   ? Hypertension   ? IgG lambda monoclonal gammopathy   ? "OF UNDETERMINED SIGNIFICANCE" - FOLLOWED BY DR. Beryle Beams  ? Lumbar pain   ? HX O PAIN LEFT LEG AND XRAYS BACK IN 2005 OR 2006 AND TOLD DISC RUBBING ON NERVE - GIVEN INJECTIONS LOWER BACK - DIDN'T SOLVE THE PAIN DOWN LEFT LEG  ? Lymphadenopathy, axillary 04/07/2013  ? 3 cm right axillary node S/P BIOPSY - NOT MALIGNANT  ? 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 Sickle cell trait ([place])   ? Wears glasses   ? Wears partial dentures   ? top and bottom partials  ? ? ?Past Surgical History:  ?Procedure Laterality Date  ? APPENDECTOMY    ? COLONOSCOPY    ? ENTEROSCOPY  08/24/2011  ? Procedure: ENTEROSCOPY;  Surgeon: Inda Castle, MD;  Location: [facility];  Service: Endoscopy;  Laterality: N/A;  ? HERNIA REPAIR    ? rt ing  ? MASS EXCISION Right 09/01/2013  ? Procedure: EXCISION AXILLA  MASS;  Surgeon: Joyice Faster. Cornett, MD;  Location: [facility];  Service: General;  Laterality: Right;  ? TOTAL HIP ARTHROPLASTY Left 12/25/2013  ? Procedure: LEFT TOTAL HIP ARTHROPLASTY ANTERIOR APPROACH;  Surgeon: Mcarthur Rossetti, MD;  Location: [facility];  Service: Orthopedics;  Laterality: Left;  ? TOTAL HIP ARTHROPLASTY Right 09/02/2020  ? Procedure: RIGHT TOTAL HIP ARTHROPLASTY ANTERIOR APPROACH;  Surgeon: Mcarthur Rossetti, MD;  Location: [facility];  Service: Orthopedics;  Laterality: Right;  ? ? ?Medications: Reviewed ? ?Allergies: No Known Allergies ? ?Family history: No family history of anemia or cancer ? ?Social History:  ? ?He lives with his wife in [place].  He is  retired from working for the "[employer] ".  He quit smoking cigarettes greater than 15 years ago.  He has 1 liquor drink per day.  No history of heavy alcohol use.  No transfusion history.  No risk factor for HIV or hepatitis. ? ?ROS:  ? ?Positives include: Exertional dyspnea for the past few months, constipation since beginning iron therapy ? ?A complete ROS was otherwise negative. ? ?Physical Exam: ? ?Blood pressure (!) 150/59, pulse 90, temperature 97.8 ?F (36.6 ?C), temperature source Oral, resp. rate 20, height _0  (1.88 m), weight 220 lb 12.8 oz (100.2 kg), SpO2 96 %. ? ?HEENT: Oropharynx without visible mass, neck without mass ?Lungs: Clear bilaterally ?Cardiac: Regular rate and rhythm ?Abdomen: No hepatosplenomegaly  ?Vascular: No leg edema ?Lymph nodes: No cervical, supraclavicular, axillary, or inguinal nodes ?Neurologic: Alert and oriented, the motor exam appears intact in the upper and lower extremities bilaterally aside from 4/5 strength with flexion at the left hip ?Skin: No rash ?Musculoskeletal: No spine tenderness ? ? ?LAB: ? ?CBC ? ?Lab Results  ?Component Value Date  ? WBC 3.8 (L) 11/27/2021  ? HGB 8.5 (L) 11/27/2021  ? HCT 28.8 (L) 11/27/2021  ? MCV 85.2 11/27/2021  ? PLT 233 11/27/2021  ? NEUTROABS 2.1 11/27/2021  ?  ?Blood smear: The platelets appear normal in number.  No platelet clumps.  The majority the white cells are mature neutrophils and lymphocytes.  No blasts or other young forms are seen.  The polychromasia is increased.  There are ovalocytes, a few teardrops, a few target cells, and microcytes.  No nucleated red cells ?  ? ?CMP  ?Lab Results  ?Component Value Date  ? NA 143 08/23/2021  ? K 3.9 08/23/2021  ? CL 109 (H) 08/23/2021  ? CO2 21 08/23/2021  ? GLUCOSE 143 (H) 08/23/2021  ? BUN 15 08/23/2021  ? CREATININE 1.35 (H) 08/23/2021  ? CALCIUM 8.6 08/23/2021  ? PROT 7.5 01/03/2016  ? ALBUMIN 4.4 07/08/2015  ? AST 24 07/08/2015  ? ALT 22 07/08/2015  ? ALKPHOS 52 07/08/2015  ? BILITOT  0.4 07/08/2015  ? GFRNONAA >60 09/03/2020  ? GFRAA 69 07/08/2015  ? ? ? ? ?Assessment/Plan:  ? 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 ? ?Disposition:  ? ?[name] has a history of iron deficiency anemia felt to be related to chronic GI blood loss from AVMs.  He again has symptomatic anemia.  The ferritin returned low 11/15/2021  and review of the peripheral blood smear today is consistent with iron deficiency.  He has been taking iron 3 times per day.  I recommend IV iron therapy.  He has received IV iron in the past.  He reports no allergic reaction from IV iron.  We reviewed potential toxicities associated with IV iron including the chance of an allergic reaction.  He will be treated with Venofer tomorrow and again in 2 weeks. ? ?We discussed the indication and risks of a Red cell transfusion.  He did not have significant dyspnea when ambulating in the office today.  The exertional dyspnea has been present for several months.  We decided to hold on the Red cell transfusion for now. ? 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  We will obtain a myeloma panel today. ? ?[name] will return for an office visit and IV iron in 2 weeks. ? ?Betsy Coder, MD  ?11/28/2021, 3:12 PM ? ? ?

## 2021-11-29 ENCOUNTER — Other Ambulatory Visit: Payer: Self-pay

## 2021-11-29 ENCOUNTER — Inpatient Hospital Stay: Payer: Medicare Other

## 2021-11-29 VITALS — BP 110/55 | HR 71 | Temp 98.0°F | Resp 18

## 2021-11-29 DIAGNOSIS — D500 Iron deficiency anemia secondary to blood loss (chronic): Secondary | ICD-10-CM | POA: Diagnosis not present

## 2021-11-29 LAB — PROTEIN ELECTROPHORESIS, SERUM
A/G Ratio: 1.1 (ref 0.7–1.7)
Albumin ELP: 3.6 g/dL (ref 2.9–4.4)
Alpha-1-Globulin: 0.3 g/dL (ref 0.0–0.4)
Alpha-2-Globulin: 0.6 g/dL (ref 0.4–1.0)
Beta Globulin: 1.1 g/dL (ref 0.7–1.3)
Gamma Globulin: 1.3 g/dL (ref 0.4–1.8)
Globulin, Total: 3.4 g/dL (ref 2.2–3.9)
Total Protein ELP: 7 g/dL (ref 6.0–8.5)

## 2021-11-29 LAB — KAPPA/LAMBDA LIGHT CHAINS
Kappa free light chain: 40.9 mg/L — ABNORMAL HIGH (ref 3.3–19.4)
Kappa, lambda light chain ratio: 0.7 (ref 0.26–1.65)
Lambda free light chains: 58.4 mg/L — ABNORMAL HIGH (ref 5.7–26.3)

## 2021-11-29 MED ORDER — SODIUM CHLORIDE 0.9 % IV SOLN
300.0000 mg | Freq: Once | INTRAVENOUS | Status: AC
  Administered 2021-11-29: 300 mg via INTRAVENOUS
  Filled 2021-11-29: qty 200

## 2021-11-29 MED ORDER — SODIUM CHLORIDE 0.9 % IV SOLN: Freq: Once | INTRAVENOUS | Status: AC

## 2021-11-29 NOTE — Patient Instructions (Signed)

## 2021-11-30 ENCOUNTER — Telehealth: Payer: Self-pay | Admitting: Gastroenterology

## 2021-11-30 ENCOUNTER — Telehealth: Payer: Self-pay

## 2021-11-30 LAB — IMMUNOFIXATION ELECTROPHORESIS
IgA: 305 mg/dL (ref 61–437)
IgG (Immunoglobin G), Serum: 1380 mg/dL (ref 603–1613)
IgM (Immunoglobulin M), Srm: 39 mg/dL (ref 15–143)
Total Protein ELP: 6.7 g/dL (ref 6.0–8.5)

## 2021-11-30 NOTE — Telephone Encounter (Signed)
See alternate telephone note from today. ?

## 2021-11-30 NOTE — Telephone Encounter (Signed)
Patient returned your phone call.  Please call back.  Thank you. 

## 2021-11-30 NOTE — Telephone Encounter (Signed)
Attempted to reach pt, spoke with patient's wife. She stated that pt is at the store right now and she will have him call me back. I provided pt's wife with the office phone #. ?

## 2021-11-30 NOTE — Telephone Encounter (Signed)
Patient returned your phone call.  Please call back.  Thank you. ? ?Returned call to patient. He has confirmed his EGD appt at [facility] on 12/12/21 at 11 am. Pt is aware that he will need to arrive at 9:30 am with a care partner. Pt confirmed that he received his instructions in the mail. Pt verbalized understanding and had no concerns at the end of the call. ? ?

## 2021-12-04 ENCOUNTER — Encounter (HOSPITAL_COMMUNITY): Payer: Self-pay | Admitting: Gastroenterology

## 2021-12-04 ENCOUNTER — Encounter: Payer: Self-pay | Admitting: Gastroenterology

## 2021-12-12 ENCOUNTER — Ambulatory Visit (HOSPITAL_BASED_OUTPATIENT_CLINIC_OR_DEPARTMENT_OTHER): Payer: Medicare Other | Admitting: Registered Nurse

## 2021-12-12 ENCOUNTER — Ambulatory Visit (HOSPITAL_COMMUNITY)
Admission: RE | Admit: 2021-12-12 | Discharge: 2021-12-12 | Disposition: A | Discharge status: Home / Self Care | Payer: Medicare Other | Attending: Gastroenterology | Admitting: Gastroenterology

## 2021-12-12 ENCOUNTER — Encounter (HOSPITAL_COMMUNITY): Payer: Self-pay | Admitting: Gastroenterology

## 2021-12-12 ENCOUNTER — Other Ambulatory Visit: Payer: Self-pay

## 2021-12-12 ENCOUNTER — Encounter (HOSPITAL_COMMUNITY)
Admission: RE | Disposition: A | Discharge status: Home / Self Care | Payer: Self-pay | Source: Home / Self Care | Attending: Gastroenterology

## 2021-12-12 ENCOUNTER — Ambulatory Visit (HOSPITAL_COMMUNITY): Payer: Medicare Other | Admitting: Registered Nurse

## 2021-12-12 DIAGNOSIS — I2510 Atherosclerotic heart disease of native coronary artery without angina pectoris: Secondary | ICD-10-CM | POA: Diagnosis not present

## 2021-12-12 DIAGNOSIS — Z87891 Personal history of nicotine dependence: Secondary | ICD-10-CM | POA: Diagnosis not present

## 2021-12-12 DIAGNOSIS — R0609 Other forms of dyspnea: Secondary | ICD-10-CM | POA: Insufficient documentation

## 2021-12-12 DIAGNOSIS — K31811 Angiodysplasia of stomach and duodenum with bleeding: Secondary | ICD-10-CM

## 2021-12-12 DIAGNOSIS — D573 Sickle-cell trait: Secondary | ICD-10-CM | POA: Diagnosis not present

## 2021-12-12 DIAGNOSIS — D500 Iron deficiency anemia secondary to blood loss (chronic): Secondary | ICD-10-CM | POA: Diagnosis not present

## 2021-12-12 DIAGNOSIS — I358 Other nonrheumatic aortic valve disorders: Secondary | ICD-10-CM | POA: Insufficient documentation

## 2021-12-12 DIAGNOSIS — K219 Gastro-esophageal reflux disease without esophagitis: Secondary | ICD-10-CM | POA: Diagnosis not present

## 2021-12-12 DIAGNOSIS — I10 Essential (primary) hypertension: Secondary | ICD-10-CM | POA: Insufficient documentation

## 2021-12-12 DIAGNOSIS — K31819 Angiodysplasia of stomach and duodenum without bleeding: Secondary | ICD-10-CM | POA: Diagnosis not present

## 2021-12-12 HISTORY — PX: ENTEROSCOPY: SHX5533

## 2021-12-12 HISTORY — PX: HEMOSTASIS CLIP PLACEMENT: SHX6857

## 2021-12-12 HISTORY — PX: HOT HEMOSTASIS: SHX5433

## 2021-12-12 LAB — CBC
HCT: 32.2 % — ABNORMAL LOW (ref 39.0–52.0)
Hemoglobin: 9.7 g/dL — ABNORMAL LOW (ref 13.0–17.0)
MCH: 27.1 pg (ref 26.0–34.0)
MCHC: 30.1 g/dL (ref 30.0–36.0)
MCV: 89.9 fL (ref 80.0–100.0)
Platelets: 199 10*3/uL (ref 150–400)
RBC: 3.58 MIL/uL — ABNORMAL LOW (ref 4.22–5.81)
RDW: 18.4 % — ABNORMAL HIGH (ref 11.5–15.5)
WBC: 3.9 10*3/uL — ABNORMAL LOW (ref 4.0–10.5)
nRBC: 0 % (ref 0.0–0.2)

## 2021-12-12 SURGERY — EGD, WITH ARGON PLASMA COAGULATION
Anesthesia: Monitor Anesthesia Care

## 2021-12-12 MED ORDER — LIDOCAINE 2% (20 MG/ML) 5 ML SYRINGE
INTRAMUSCULAR | Status: DC | PRN
  Administered 2021-12-12: 100 mg via INTRAVENOUS

## 2021-12-12 MED ORDER — PHENYLEPHRINE 40 MCG/ML (10ML) SYRINGE FOR IV PUSH (FOR BLOOD PRESSURE SUPPORT)
PREFILLED_SYRINGE | INTRAVENOUS | Status: DC | PRN
  Administered 2021-12-12 (×2): 80 ug via INTRAVENOUS

## 2021-12-12 MED ORDER — PROPOFOL 1000 MG/100ML IV EMUL
INTRAVENOUS | Status: AC
  Filled 2021-12-12: qty 100

## 2021-12-12 MED ORDER — LACTATED RINGERS IV SOLN
INTRAVENOUS | Status: DC
Start: 2021-12-12 — End: 2021-12-12

## 2021-12-12 MED ORDER — ONDANSETRON HCL 4 MG/2ML IJ SOLN
INTRAMUSCULAR | Status: DC | PRN
  Administered 2021-12-12: 4 mg via INTRAVENOUS

## 2021-12-12 MED ORDER — PROPOFOL 500 MG/50ML IV EMUL
INTRAVENOUS | Status: DC | PRN
  Administered 2021-12-12: 125 ug/kg/min via INTRAVENOUS

## 2021-12-12 MED ORDER — LACTATED RINGERS IV SOLN: INTRAVENOUS | Status: DC

## 2021-12-12 MED ORDER — PROPOFOL 10 MG/ML IV BOLUS
INTRAVENOUS | Status: DC | PRN
Start: 2021-12-12 — End: 2021-12-12
  Administered 2021-12-12: 50 mg via INTRAVENOUS

## 2021-12-12 SURGICAL SUPPLY — 15 items

## 2021-12-12 NOTE — Interval H&P Note (Signed)
History and Physical Interval Note: ? ?12/12/2021 ?10:36 AM ? ?[name]  has presented today for surgery, with the diagnosis of IDA, duodenal AVM.  The various methods of treatment have been discussed with the patient and family. After consideration of risks, benefits and other options for treatment, the patient has consented to  Procedure(s): ?HOT HEMOSTASIS (ARGON PLASMA COAGULATION/BICAP) (N/A) ?ENTEROSCOPY (N/A) as a surgical intervention.  The patient's history has been reviewed, patient examined, no change in status, stable for surgery.  I have reviewed the patient's chart and labs.  Questions were answered to the patient's satisfaction.   ? ? ?[name] ? ? ?

## 2021-12-12 NOTE — Transfer of Care (Signed)
Immediate Anesthesia Transfer of Care Note ? ?Patient: [name] ? ?Procedure(s) Performed: HOT HEMOSTASIS (ARGON PLASMA COAGULATION/BICAP) ?ENTEROSCOPY ?HEMOSTASIS CLIP PLACEMENT ? ?Patient Location: PACU ? ?Anesthesia Type:MAC ? ?Level of Consciousness: sedated ? ?Airway & Oxygen Therapy: Patient Spontanous Breathing and Patient connected to face mask oxygen ? ?Post-op Assessment: Report given to RN and Post -op Vital signs reviewed and stable ? ?Post vital signs: Reviewed and stable ? ?Last Vitals:  ?Vitals Value Taken Time  ?BP    ?Temp    ?Pulse    ?Resp    ?SpO2    ? ? ?Last Pain:  ?Vitals:  ? 12/12/21 1008  ?TempSrc: Temporal  ?PainSc: 0-No pain  ?   ? ?  ? ?Complications: No notable events documented. ?

## 2021-12-12 NOTE — Anesthesia Postprocedure Evaluation (Signed)
Anesthesia Post Note ? ?Patient: [name] ? ?Procedure(s) Performed: HOT HEMOSTASIS (ARGON PLASMA COAGULATION/BICAP) ?ENTEROSCOPY ?HEMOSTASIS CLIP PLACEMENT ? ?  ? ?Patient location during evaluation: PACU ?Anesthesia Type: MAC ?Level of consciousness: awake and alert ?Pain management: pain level controlled ?Vital Signs Assessment: post-procedure vital signs reviewed and stable ?Respiratory status: spontaneous breathing, nonlabored ventilation, respiratory function stable and patient connected to nasal cannula oxygen ?Cardiovascular status: stable and blood pressure returned to baseline ?Postop Assessment: no apparent nausea or vomiting ?Anesthetic complications: no ? ? ?No notable events documented. ? ?Last Vitals:  ?Vitals:  ? 12/12/21 1130 12/12/21 1140  ?BP: 140/60 (!) 153/71  ?Pulse: 67 66  ?Resp: 18 (!) 22  ?Temp:    ?SpO2: 96% 95%  ?  ?Last Pain:  ?Vitals:  ? 12/12/21 1140  ?TempSrc:   ?PainSc: 0-No pain  ? ? ?  ?  ?  ?  ?  ?  ? ?Jarek Longton ? ? ? ? ?

## 2021-12-12 NOTE — Anesthesia Preprocedure Evaluation (Addendum)
Anesthesia Evaluation  ?Patient identified by MRN, date of birth, ID band ?Patient awake ? ? ? ?Reviewed: ?Allergy & Precautions, NPO status , Patient's Chart, lab work & pertinent test results, reviewed documented beta blocker date and time  ? ?History of Anesthesia Complications ?Negative for: history of anesthetic complications ? ?Airway ?Mallampati: I ? ?TM Distance: >3 FB ?Neck ROM: Full ? ? ? Dental ? ?(+) Partial Lower, Partial Upper, Dental Advisory Given, Missing,  ?  ?Pulmonary ?neg pulmonary ROS, former smoker,  ?  ?Pulmonary exam normal ? ? ? ? ? ? ? Cardiovascular ?hypertension, Pt. on medications and Pt. on home beta blockers ?+ CAD and + DOE  ?Normal cardiovascular exam ? ?ECHO 11/22 ?? ??1. Inferior basal hypokinesis and abnormal GLS noted in inferior base as well . Left ventricular ejection fraction, by estimation, is 55%. The left ventricle has normal function. The left ventricle demonstrates regional wall motion abnormalities (see  ?scoring diagram/findings for description). There is mild left ventricular hypertrophy. Left ventricular diastolic parameters were normal. ??2. Right ventricular systolic function is normal. The right ventricular size is normal. There is normal pulmonary artery systolic pressure. ??3. Left atrial size was moderately dilated. ??4. The mitral valve is normal in structure. Trivial mitral valve regurgitation. No evidence of mitral stenosis. ??5. The aortic valve was not well visualized. There is mild calcification of the aortic valve. Aortic valve regurgitation is not visualized. Aortic valve sclerosis is present, with no evidence of aortic valve stenosis. ?  ?Neuro/Psych ?negative neurological ROS ? negative psych ROS  ? GI/Hepatic ?Neg liver ROS, GERD  Medicated,Small bowel AVMs ?  ?Endo/Other  ?negative endocrine ROS ? Renal/GU ?negative Renal ROS  ?negative genitourinary ?  ?Musculoskeletal ? ?(+) Arthritis ,  ? Abdominal ?  ?Peds ?  Hematology ?negative hematology ROS ?(+) Blood dyscrasia, Sickle cell trait and anemia ,   ?Anesthesia Other Findings ? ?MGUS ? Reproductive/Obstetrics ? ?  ? ? ? ? ? ? ? ? ? ? ? ? ? ?  ?  ? ? ? ? ? ? ? ?Anesthesia Physical ? ?Anesthesia Plan ? ?ASA: 3 ? ?Anesthesia Plan: MAC  ? ?Post-op Pain Management: Minimal or no pain anticipated  ? ?Induction: Intravenous ? ?PONV Risk Score and Plan: 1 and Propofol infusion ? ?Airway Management Planned: Nasal Cannula, Simple Face Mask and Natural Airway ? ?Additional Equipment: None ? ?Intra-op Plan:  ? ?Post-operative Plan:  ? ?Informed Consent: I have reviewed the patients History and Physical, chart, labs and discussed the procedure including the risks, benefits and alternatives for the proposed anesthesia with the patient or authorized representative who has indicated his/her understanding and acceptance.  ? ? ? ? ? ?Plan Discussed with: Anesthesiologist and CRNA ? ?Anesthesia Plan Comments:   ? ? ? ? ? ? ?Anesthesia Quick Evaluation ? ?

## 2021-12-12 NOTE — Op Note (Signed)
[facility] ?Patient Name: [name] ?Procedure Date: 12/12/2021 ?MRN: [id number] ?Attending MD: Estill Cotta. Loletha Norris , MD ?Date of Birth: [birth year] ?CSN: [id number] ?Age: 81 ?Admit Type: Outpatient ?Procedure:                Upper GI endoscopy ?Indications:              Iron deficiency anemia secondary to chronic blood  ?                          loss, For therapy of angioectasia of duodenum ?                          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                          recent outpatient EGD ?Providers:                Estill Cotta. Loletha Carrow, MD, Jeanella Cara, RN,  ?                          Despina Pole, Technician, Gertie Exon, CRNA ?Referring MD:             Dr. Domenick Gong ?Medicines:                Monitored Anesthesia Care ?Complications:            No immediate complications. ?Estimated Blood Loss:     Estimated blood loss was minimal. ?Procedure:                Pre-Anesthesia Assessment: ?                          - Prior to the procedure, a History and Physical  ?                          was performed, and patient medications and  ?                          allergies were reviewed. The patient's tolerance of  ?                          previous anesthesia was also reviewed. The risks  ?                          and benefits of the procedure and the sedation  ?                          options and risks were discussed with the patient.  ?                          All questions were answered, and informed consent  ?                          was obtained. Prior Anticoagulants: The patient has  ?  taken no previous anticoagulant or antiplatelet  ?                          agents. ASA Grade Assessment: II - A patient with  ?                          mild systemic disease. After reviewing the risks  ?                          and benefits, the patient was deemed in  ?                          satisfactory condition to undergo the procedure. ?                           After obtaining informed consent, the endoscope was  ?                          passed under direct vision. Throughout the  ?                          procedure, the patient's blood pressure, pulse, and  ?                          oxygen saturations were monitored continuously. The  ?                          GIF-[id number] ([id number]) Olympus endoscope was introduced  ?                          through the mouth, and advanced to the second part  ?                          of duodenum. The upper GI endoscopy was somewhat  ?                          difficult (challenging location of lesions  ?                          requiring intervention). ?Scope In: ?Scope Out: ?Findings: ?     The esophagus was normal. ?     A single small angioectasia ( or possibly small Dieulafoys) with  ?     bleeding was found in the cardia. Coagulation for hemostasis using argon  ?     plasma at 0.5 liters/minute and 40 watts was successful. To prevent  ?     bleeding post-intervention, one hemostatic clip was successfully placed  ?     (MR conditional). ?     The exam of the stomach was otherwise normal. ?     A single small angioectasia without bleeding was found in the duodenal  ?     bulb. Coagulation for hemostasis using argon plasma at 0.5 liters/minute  ?     and 20 watts was successful. To prevent bleeding post-intervention, one  ?     hemostatic clip was successfully placed (MR  conditional). ?     The exam of the duodenum was otherwise normal. ?Impression:               - Normal esophagus. ?                          - A single bleeding angioectasia in the stomach.  ?                          Treated with argon plasma coagulation (APC). Clip  ?                          (MR conditional) was placed. ?                          - A single non-bleeding angioectasia in the  ?                          duodenum. Treated with argon plasma coagulation  ?                          (APC). Clip (MR conditional) was placed. ?                           - No specimens collected. ?                          Small bowel enteroscopy was initially planned, then  ?                          I decided not to do so. Rationale: obvious bleeding  ?                          sources were identified and treated, as well as the  ?                          risk of scope looping trauma to the treated area in  ?                          the cardia. ?Moderate Sedation: ?     MAC sedation used ?Recommendation:           - Patient has a contact number available for  ?                          emergencies. The signs and symptoms of potential  ?                          delayed complications were discussed with the  ?                          patient. Return to normal activities tomorrow.  ?                          Written discharge instructions were provided to the  ?  patient. ?                          - Resume previous diet. ?                          - Continue present medications. ?                          - See Hematologist (Dr. Benay Spice) as scheduled to  ?                          check blood counts and manage iron dosing. ?                          - Return to my office under direction of Hematology  ?                          if there is clinical concern for ongoing GI blood  ?                          loss. ?Procedure Code(s):        --- Professional --- ?                          44818, Esophagogastroduodenoscopy, flexible,  ?                          transoral; with control of bleeding, any method ?Diagnosis Code(s):        --- Professional --- ?                          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                          with bleeding ?                          D50.0, Iron deficiency anemia secondary to blood  ?                          loss (chronic) ?CPT copyright 2019 American Medical Association. All rights reserved. ?The codes documented in this report are preliminary and upon coder review may  ?be  revised to meet current compliance requirements. ?Ruben Vanderloop L. Loletha Carrow, MD ?12/12/2021 11:26:41 AM ?This report has been signed electronically. ?Number of Addenda: 0 ?

## 2021-12-12 NOTE — Discharge Instructions (Addendum)
YOU HAD AN ENDOSCOPIC PROCEDURE TODAY: Refer to the procedure report and other information in the discharge instructions given to you for any specific questions about what was found during the examination. If this information does not answer your questions, please call [place] office at [phone] to clarify.  ? ?YOU SHOULD EXPECT: Some feelings of bloating in the abdomen. Passage of more gas than usual. Walking can help get rid of the air that was put into your GI tract during the procedure and reduce the bloating. If you had a lower endoscopy (such as a colonoscopy or flexible sigmoidoscopy) you may notice spotting of blood in your stool or on the toilet paper. Some abdominal soreness may be present for a day or two, also. ? ?DIET: Your first meal following the procedure should be a light meal and then it is ok to progress to your normal diet. A half-sandwich or bowl of soup is an example of a good first meal. Heavy or fried foods are harder to digest and may make you feel nauseous or bloated. Drink plenty of fluids but you should avoid alcoholic beverages for 24 hours. If you had a esophageal dilation, please see attached instructions for diet.   ? ?ACTIVITY: Your care partner should take you home directly after the procedure. You should plan to take it easy, moving slowly for the rest of the day. You can resume normal activity the day after the procedure however YOU SHOULD NOT DRIVE, use power tools, machinery or perform tasks that involve climbing or major physical exertion for 24 hours (because of the sedation medicines used during the test).  ? ?SYMPTOMS TO REPORT IMMEDIATELY: ?A gastroenterologist can be reached at any hour. Please call [phone]  for any of the following symptoms:  ?Following upper endoscopy (EGD, EUS, ERCP, esophageal dilation) ?Vomiting of blood or coffee ground material  ?New, significant abdominal pain  ?New, significant chest pain or pain under the shoulder blades  ?Painful or  persistently difficult swallowing  ?New shortness of breath  ?Black, tarry-looking or red, bloody stools ? ?FOLLOW UP:  ?If any biopsies were taken you will be contacted by phone or by letter within the next 1-3 weeks. Call [phone]  if you have not heard about the biopsies in 3 weeks.  ?Please also call with any specific questions about appointments or follow up tests.  ? ? ?Continue your omeprazole daily to control heartburn. ?

## 2021-12-12 NOTE — H&P (Signed)
History and Physical: ? 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  Today he is having small bowel enteroscopy for ablation of that and any other AVMs discovered today. ? ?Patient denies abdominal pain. ? ?ROS: ?Patient denies chest pain or shortness of breath ? ? ?Past Medical History: ?Past Medical History:  ?Diagnosis Date  ? Abnormal CT of the chest 04/16/13   ? Anemia   ? sees dr Griffin Basil OF IRON DEFICIENCY ANEMIA FROM INTERMITTENT GI BLOOD LOSS RELATED TO SMALL BOWEL AVMS  ? 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 LEFT HIP - PT STATES HE WALKS WITH LIMP AND PAIN AT TIMES  ? AVM (arteriovenous malformation) of colon   ? HX OF GI BLOOD LOSS RELATED TO SMALL BOWEL AVM'S  ? GERD (gastroesophageal reflux disease)   ? Gout   ? Hyperlipemia   ? Hypertension   ? IgG lambda monoclonal gammopathy   ? "OF UNDETERMINED SIGNIFICANCE" - FOLLOWED BY DR. Beryle Beams  ? Lumbar pain   ? HX O PAIN LEFT LEG AND XRAYS BACK IN 2005 OR 2006 AND TOLD DISC RUBBING ON NERVE - GIVEN INJECTIONS LOWER BACK - DIDN'T SOLVE THE PAIN DOWN LEFT LEG  ? Lymphadenopathy, axillary 04/07/2013  ? 3 cm right axillary node S/P BIOPSY - NOT MALIGNANT  ? 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 Sickle cell trait ([place])   ? Wears glasses   ? Wears partial dentures   ? top and bottom partials  ? ? ? ?Past Surgical History: ?Past Surgical History:  ?Procedure Laterality Date  ? APPENDECTOMY    ? COLONOSCOPY    ? ENTEROSCOPY  08/24/2011  ? Procedure: ENTEROSCOPY;  Surgeon: Inda Castle, MD;  Location: [facility];  Service: Endoscopy;  Laterality: N/A;  ? HERNIA REPAIR    ? rt ing  ? MASS EXCISION Right 09/01/2013  ? Procedure: EXCISION AXILLA  MASS;  Surgeon: Joyice Faster. Cornett, MD;  Location: [facility];  Service: General;  Laterality: Right;  ? TOTAL HIP ARTHROPLASTY Left 12/25/2013  ? Procedure: LEFT TOTAL HIP ARTHROPLASTY ANTERIOR  APPROACH;  Surgeon: Mcarthur Rossetti, MD;  Location: [facility];  Service: Orthopedics;  Laterality: Left;  ? TOTAL HIP ARTHROPLASTY Right 09/02/2020  ? Procedure: RIGHT TOTAL HIP ARTHROPLASTY ANTERIOR APPROACH;  Surgeon: Mcarthur Rossetti, MD;  Location: [facility];  Service: Orthopedics;  Laterality: Right;  ? ? ?Allergies: ?No Known Allergies ? ?Outpatient Meds: ?Current Facility-Administered Medications  ?Medication Dose Route Frequency Provider Last Rate Last Admin  ? lactated ringers infusion   Intravenous Continuous Nelida Meuse III, MD 10 mL/hr at 12/12/21 1025 Continued from Pre-op at 12/12/21 1025  ? lactated ringers infusion   Intravenous Continuous Doran Stabler, MD      ? ?Facility-Administered Medications Ordered in Other Encounters  ?Medication Dose Route Frequency Provider Last Rate Last Admin  ? lidocaine 2% (20 mg/mL) 5 mL syringe   Intravenous Anesthesia Intra-op Talbot Grumbling, CRNA   100 mg at 12/12/21 1027  ? ondansetron (ZOFRAN) injection   Intravenous Anesthesia Intra-op Talbot Grumbling, CRNA   4 mg at 12/12/21 1032  ? propofol (DIPRIVAN) 10 mg/mL bolus/IV push   Intravenous Anesthesia Intra-op Talbot Grumbling, CRNA   50 mg at 12/12/21 1027  ? propofol (DIPRIVAN) 500 MG/50ML infusion   Intravenous Continuous PRN Talbot Grumbling, CRNA 72.15 mL/hr at 12/12/21 1027 125 mcg/kg/min at 12/12/21 1027  ? ? ? ? ?  ___________________________________________________________________ ?Objective  ? ?Exam: ? ?BP 125/65   Pulse 79   Temp 98.3 ?F (36.8 ?C) (Temporal)   Resp (!) 21   Ht '6\' 2"'$  (1.88 m)   Wt 96.2 kg   SpO2 99%   BMI 27.22 kg/m?  ? ?CV: RRR without murmur, S1/S2 ?Resp: clear to auscultation bilaterally, normal RR and effort noted ?GI: soft, no tenderness, with active bowel sounds. ? ?Hemoglobin 9.7 today, up from 8.5 on 11/27/2021. 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 ? ?Plan: ?Small bowel enteroscopy with APC ablation of  AVM(s) ? The benefits and risks of the planned procedure were described in detail with the patient or (when appropriate) their health care proxy.  Risks were outlined as including, but not limited to, bleeding, infection, perforation, adverse medication reaction leading to cardiac or pulmonary decompensation, pancreatitis (if ERCP).  The limitation of incomplete mucosal visualization was also discussed.  No guarantees or warranties were given. ? ? ? ?The patient is appropriate for an endoscopic procedure in the ambulatory setting. ? ? - Wilfrid Lund, MD ? ? ? ? ?

## 2021-12-13 ENCOUNTER — Ambulatory Visit: Payer: Medicare Other

## 2021-12-14 ENCOUNTER — Inpatient Hospital Stay: Payer: Medicare Other

## 2021-12-14 ENCOUNTER — Inpatient Hospital Stay: Payer: Medicare Other | Admitting: Nurse Practitioner

## 2021-12-14 ENCOUNTER — Encounter: Payer: Self-pay | Admitting: Nurse Practitioner

## 2021-12-14 VITALS — BP 109/54 | HR 70 | Temp 98.5°F | Resp 20

## 2021-12-14 VITALS — BP 121/69 | HR 88 | Temp 98.7°F | Resp 20 | Ht 74.0 in | Wt 220.4 lb

## 2021-12-14 DIAGNOSIS — D472 Monoclonal gammopathy: Secondary | ICD-10-CM | POA: Diagnosis not present

## 2021-12-14 DIAGNOSIS — D500 Iron deficiency anemia secondary to blood loss (chronic): Secondary | ICD-10-CM

## 2021-12-14 LAB — CBC WITH DIFFERENTIAL (CANCER CENTER ONLY)
Abs Immature Granulocytes: 0.01 10*3/uL (ref 0.00–0.07)
Basophils Absolute: 0 10*3/uL (ref 0.0–0.1)
Basophils Relative: 1 %
Eosinophils Absolute: 0.1 10*3/uL (ref 0.0–0.5)
Eosinophils Relative: 3 %
HCT: 33.6 % — ABNORMAL LOW (ref 39.0–52.0)
Hemoglobin: 9.9 g/dL — ABNORMAL LOW (ref 13.0–17.0)
Immature Granulocytes: 0 %
Lymphocytes Relative: 21 %
Lymphs Abs: 0.8 10*3/uL (ref 0.7–4.0)
MCH: 25.8 pg — ABNORMAL LOW (ref 26.0–34.0)
MCHC: 29.5 g/dL — ABNORMAL LOW (ref 30.0–36.0)
MCV: 87.7 fL (ref 80.0–100.0)
Monocytes Absolute: 0.5 10*3/uL (ref 0.1–1.0)
Monocytes Relative: 14 %
Neutro Abs: 2.3 10*3/uL (ref 1.7–7.7)
Neutrophils Relative %: 61 %
Platelet Count: 219 10*3/uL (ref 150–400)
RBC: 3.83 MIL/uL — ABNORMAL LOW (ref 4.22–5.81)
RDW: 17.4 % — ABNORMAL HIGH (ref 11.5–15.5)
WBC Count: 3.8 10*3/uL — ABNORMAL LOW (ref 4.0–10.5)
nRBC: 0 % (ref 0.0–0.2)

## 2021-12-14 LAB — RETICULOCYTES
Immature Retic Fract: 34.1 % — ABNORMAL HIGH (ref 2.3–15.9)
RBC.: 3.78 MIL/uL — ABNORMAL LOW (ref 4.22–5.81)
Retic Count, Absolute: 177.7 10*3/uL (ref 19.0–186.0)
Retic Ct Pct: 4.7 % — ABNORMAL HIGH (ref 0.4–3.1)

## 2021-12-14 LAB — BASIC METABOLIC PANEL - CANCER CENTER ONLY
Anion gap: 9 (ref 5–15)
BUN: 13 mg/dL (ref 8–23)
CO2: 22 mmol/L (ref 22–32)
Calcium: 8.9 mg/dL (ref 8.9–10.3)
Chloride: 110 mmol/L (ref 98–111)
Creatinine: 1.25 mg/dL — ABNORMAL HIGH (ref 0.61–1.24)
GFR, Estimated: 59 mL/min — ABNORMAL LOW (ref >60)
Glucose, Bld: 106 mg/dL — ABNORMAL HIGH (ref 70–99)
Potassium: 3.8 mmol/L (ref 3.5–5.1)
Sodium: 141 mmol/L (ref 135–145)

## 2021-12-14 MED ORDER — SODIUM CHLORIDE 0.9 % IV SOLN: Freq: Once | INTRAVENOUS | Status: AC

## 2021-12-14 MED ORDER — SODIUM CHLORIDE 0.9 % IV SOLN
300.0000 mg | Freq: Once | INTRAVENOUS | Status: AC
  Administered 2021-12-14: 300 mg via INTRAVENOUS
  Filled 2021-12-14: qty 15

## 2021-12-14 NOTE — Patient Instructions (Signed)

## 2021-12-14 NOTE — Progress Notes (Signed)
?  [place] ?OFFICE PROGRESS NOTE ? ? ?Diagnosis: Anemia, history of serum IgG lambda monoclonal protein ? ?INTERVAL HISTORY:  ? ?Mr. Strite returns as scheduled.  He received a dose of Venofer 300 mg IV on 11/29/2021.  He underwent an upper endoscopy by Dr. Loletha Carrow on 12/12/2021.  A single bleeding angiectasia was noted in the stomach, another in the duodenum.  This was treated with argon plasma coagulation. ? ?He reports tolerating the IV iron well.  Specifically no signs of allergic reaction.  He is not aware of any bleeding.  He has mild dyspnea on exertion.  In general he feels well. ? ?Objective: ? ?Vital signs in last 24 hours: ? ?Blood pressure 121/69, pulse 88, temperature 98.7 ?F (37.1 ?C), temperature source Oral, resp. rate 20, height '6\' 2"'$  (1.88 m), weight 220 lb 6.4 oz (100 kg), SpO2 96 %. ?  ? ?Resp: Lungs clear bilaterally. ?Cardio: Regular rate and rhythm. ?GI: Abdomen soft and nontender.  No hepatosplenomegaly. ?Vascular: No leg edema. ? ? ? ?Lab Results: ? ?Lab Results  ?Component Value Date  ? WBC 3.8 (L) 12/14/2021  ? HGB 9.9 (L) 12/14/2021  ? HCT 33.6 (L) 12/14/2021  ? MCV 87.7 12/14/2021  ? PLT 219 12/14/2021  ? NEUTROABS 2.3 12/14/2021  ? ? ?Imaging: ? ?No results found. ? ?Medications: I have reviewed the patient's current medications. ? ?Assessment/Plan: ?Iron deficiency anemia-chronic, most likely due to GI blood loss from AVMs ?Small bowel endoscopy with laser ablation of AVM and third portion of duodenum December 2012 ?Upper endoscopy 11/23/2021-small AVM in the duodenal bulb with adjacent fresh heme ?IV Venofer 300 mg 11/29/2021 ?Upper endoscopy 12/12/2021-bleeding AVM in the stomach and duodenum, treated with argon plasma coagulation ?IV Venofer 300 mg 12/14/2021 ?History of a serum IgG lambda monoclonal protein ?11/27/2021 SPEP-no M spike observed, immunofixation shows IgG monoclonal protein with lambda light chain specificity ?Excision of a benign reactive Lex axillary lymph  node 09/01/2013 ?Sickle cell trait ?Hypertension ?Gout ?Hyperlipidemia ?Status post bilateral hip replacement ? ?Disposition: Mr. Dickenson appears stable.  He has iron deficiency anemia most likely due to GI blood loss from AVMs.  He received a dose of Venofer 2 weeks ago.  He tolerated well.  He was found to have bleeding AVMs in the stomach and duodenum on recent upper endoscopy.  This was treated with APC.  He will receive another dose of Venofer today. ? ?He will return for a CBC and follow-up visit in 4 weeks.  We are available to see him sooner if needed. ? ? ? ?Ned Card ANP/GNP-BC  ? ?12/14/2021  ?12:10 PM ? ? ? ? ? ? ? ?

## 2022-01-11 ENCOUNTER — Inpatient Hospital Stay: Payer: Medicare Other | Admitting: Nurse Practitioner

## 2022-01-11 ENCOUNTER — Inpatient Hospital Stay: Payer: Medicare Other | Attending: Oncology

## 2022-01-11 ENCOUNTER — Encounter: Payer: Self-pay | Admitting: Nurse Practitioner

## 2022-01-11 VITALS — BP 116/63 | HR 65 | Temp 98.2°F | Resp 18 | Ht 74.0 in | Wt 218.4 lb

## 2022-01-11 DIAGNOSIS — D500 Iron deficiency anemia secondary to blood loss (chronic): Secondary | ICD-10-CM | POA: Diagnosis not present

## 2022-01-11 DIAGNOSIS — D509 Iron deficiency anemia, unspecified: Secondary | ICD-10-CM | POA: Insufficient documentation

## 2022-01-11 DIAGNOSIS — D472 Monoclonal gammopathy: Secondary | ICD-10-CM

## 2022-01-11 LAB — CBC WITH DIFFERENTIAL (CANCER CENTER ONLY)
Abs Immature Granulocytes: 0.01 10*3/uL (ref 0.00–0.07)
Basophils Absolute: 0 10*3/uL (ref 0.0–0.1)
Basophils Relative: 1 %
Eosinophils Absolute: 0.2 10*3/uL (ref 0.0–0.5)
Eosinophils Relative: 5 %
HCT: 35.4 % — ABNORMAL LOW (ref 39.0–52.0)
Hemoglobin: 10.9 g/dL — ABNORMAL LOW (ref 13.0–17.0)
Immature Granulocytes: 0 %
Lymphocytes Relative: 22 %
Lymphs Abs: 0.9 10*3/uL (ref 0.7–4.0)
MCH: 27 pg (ref 26.0–34.0)
MCHC: 30.8 g/dL (ref 30.0–36.0)
MCV: 87.6 fL (ref 80.0–100.0)
Monocytes Absolute: 0.6 10*3/uL (ref 0.1–1.0)
Monocytes Relative: 16 %
Neutro Abs: 2.2 10*3/uL (ref 1.7–7.7)
Neutrophils Relative %: 56 %
Platelet Count: 196 10*3/uL (ref 150–400)
RBC: 4.04 MIL/uL — ABNORMAL LOW (ref 4.22–5.81)
RDW: 16.8 % — ABNORMAL HIGH (ref 11.5–15.5)
WBC Count: 4 10*3/uL (ref 4.0–10.5)
nRBC: 0 % (ref 0.0–0.2)

## 2022-01-11 LAB — FERRITIN: Ferritin: 23 ng/mL — ABNORMAL LOW (ref 24–336)

## 2022-01-11 NOTE — Progress Notes (Signed)
?  [place] ?OFFICE PROGRESS NOTE ? ? ?Diagnosis: Anemia, history of serum IgG lambda monoclonal protein ? ?INTERVAL HISTORY:  ? ?[name] returns as scheduled.  He completed a second Venofer infusion 12/14/2021.  He denies signs of allergic reaction.  He notes improvement in his energy level.  He denies bleeding. ? ?Objective: ? ?Vital signs in last 24 hours: ? ?Blood pressure 116/63, pulse 65, temperature 98.2 ?F (36.8 ?C), temperature source Oral, resp. rate 18, height '6\' 2"'$  (1.88 m), weight 218 lb 6.4 oz (99.1 kg), SpO2 96 %. ?  ? ?Resp: Lungs clear bilaterally. ?Cardio: Regular rate and rhythm. ?GI: Abdomen soft and nontender.  No hepatosplenomegaly. ?Vascular: No leg edema. ? ? ? ?Lab Results: ? ?Lab Results  ?Component Value Date  ? WBC 4.0 01/11/2022  ? HGB 10.9 (L) 01/11/2022  ? HCT 35.4 (L) 01/11/2022  ? MCV 87.6 01/11/2022  ? PLT 196 01/11/2022  ? NEUTROABS 2.2 01/11/2022  ? ? ?Imaging: ? ?No results found. ? ?Medications: I have reviewed the patient's current medications. ? ?Assessment/Plan: ?Iron deficiency anemia-chronic, most likely due to GI blood loss from AVMs ?Small bowel endoscopy with laser ablation of AVM and third portion of duodenum December 2012 ?Upper endoscopy 11/23/2021-small AVM in the duodenal bulb with adjacent fresh heme ?IV Venofer 300 mg 11/29/2021 ?Upper endoscopy 12/12/2021-bleeding AVM in the stomach and duodenum, treated with argon plasma coagulation ?IV Venofer 300 mg 12/14/2021 ?History of a serum IgG lambda monoclonal protein ?11/27/2021 SPEP-no M spike observed, immunofixation shows IgG monoclonal protein with lambda light chain specificity ?Excision of a benign reactive Lex axillary lymph node 09/01/2013 ?Sickle cell trait ?Hypertension ?Gout ?Hyperlipidemia ?Status post bilateral hip replacement ? ?Disposition: [name] appears stable.  He received 2 doses of IV Venofer.  The hemoglobin continues to improve.  We will follow-up on the ferritin from today. ? ?He  will return for lab and follow-up in 3 months.  He understands to contact the office in the interim with signs/symptoms suggestive of progressive anemia. ? ? ? ?Ned Card ANP/GNP-BC  ? ?01/11/2022  ?10:42 AM ? ? ? ? ? ? ? ?

## 2022-01-15 ENCOUNTER — Telehealth: Payer: Self-pay

## 2022-01-15 ENCOUNTER — Other Ambulatory Visit: Payer: Self-pay

## 2022-01-15 DIAGNOSIS — D500 Iron deficiency anemia secondary to blood loss (chronic): Secondary | ICD-10-CM

## 2022-01-15 NOTE — Telephone Encounter (Signed)
Patient is schedule for 02/13/22 and patient is aware of the lab appointment. Patient gave verbal understanding and had no further questions or concerns. ?

## 2022-01-15 NOTE — Telephone Encounter (Signed)
-----   Message from Owens Shark, NP sent at 01/15/2022  3:57 PM EDT ----- ?Please schedule for CBC/diff and ferritin in 4 weeks--let him know of added appt ? ?

## 2022-02-13 ENCOUNTER — Inpatient Hospital Stay: Payer: Medicare Other | Attending: Oncology

## 2022-02-13 DIAGNOSIS — D509 Iron deficiency anemia, unspecified: Secondary | ICD-10-CM | POA: Insufficient documentation

## 2022-02-13 DIAGNOSIS — D500 Iron deficiency anemia secondary to blood loss (chronic): Secondary | ICD-10-CM

## 2022-02-13 LAB — CBC WITH DIFFERENTIAL (CANCER CENTER ONLY)
Abs Immature Granulocytes: 0.01 10*3/uL (ref 0.00–0.07)
Basophils Absolute: 0 10*3/uL (ref 0.0–0.1)
Basophils Relative: 1 %
Eosinophils Absolute: 0.2 10*3/uL (ref 0.0–0.5)
Eosinophils Relative: 6 %
HCT: 33.9 % — ABNORMAL LOW (ref 39.0–52.0)
Hemoglobin: 10.7 g/dL — ABNORMAL LOW (ref 13.0–17.0)
Immature Granulocytes: 0 %
Lymphocytes Relative: 20 %
Lymphs Abs: 0.9 10*3/uL (ref 0.7–4.0)
MCH: 28.2 pg (ref 26.0–34.0)
MCHC: 31.6 g/dL (ref 30.0–36.0)
MCV: 89.4 fL (ref 80.0–100.0)
Monocytes Absolute: 0.7 10*3/uL (ref 0.1–1.0)
Monocytes Relative: 16 %
Neutro Abs: 2.5 10*3/uL (ref 1.7–7.7)
Neutrophils Relative %: 57 %
Platelet Count: 162 10*3/uL (ref 150–400)
RBC: 3.79 MIL/uL — ABNORMAL LOW (ref 4.22–5.81)
RDW: 16 % — ABNORMAL HIGH (ref 11.5–15.5)
WBC Count: 4.3 10*3/uL (ref 4.0–10.5)
nRBC: 0 % (ref 0.0–0.2)

## 2022-02-13 LAB — FERRITIN: Ferritin: 30 ng/mL (ref 24–336)

## 2022-04-12 ENCOUNTER — Encounter: Payer: Self-pay | Admitting: *Deleted

## 2022-04-12 ENCOUNTER — Inpatient Hospital Stay: Payer: Medicare Other | Attending: Oncology

## 2022-04-12 ENCOUNTER — Telehealth: Payer: Self-pay | Admitting: Oncology

## 2022-04-12 ENCOUNTER — Inpatient Hospital Stay: Payer: Medicare Other | Admitting: Oncology

## 2022-04-12 NOTE — Telephone Encounter (Signed)
Attempted to contact patient in regards to missed appointments and getting them rescheduled. No answer so voicemail was left

## 2022-04-12 NOTE — Progress Notes (Signed)
"  No show" for lab/OV today. Scheduling message sent to reschedule for later in August.

## 2022-04-23 NOTE — Progress Notes (Unsigned)
Cardiology Office Note    Date:  04/25/2022   ID:  [name], DOB 09-Feb-1941, MRN [id number]   PCP:  Haywood Pao, MD   [place]  Cardiologist:  Werner Lean, MD   Advanced Practice Provider:  No care team member to display Electrophysiologist:  None   [phone]   Chief Complaint  Patient presents with   Follow-up    History of Present Illness:  [name] is a 81 y.o. male with a hx of of MGUS, Sickle Cell Trait, history of HTN, Coronary calcium score 564, 0-24% LAD and RCA plaque, echo 07/2021 norma LVEF.  Last saw Dr. Gasper Sells 06/2021 at which time he had ongoing SOB, echo normal LVEF, BNP normal. Trial off atenolol and low dose lasix.  Patient comes in for f/u. Denies chest pain, dyspnea at rest. Has some DOE when doing yard work but unchanged. No regular exercise. Has a stationary bike he rides on occasion every few weeks. He never held atenolol or started lasix.Breathing has improved over the past year.    Past Medical History:  Diagnosis Date   Abnormal CT of the chest 04/16/13    Anemia    sees dr Griffin Basil OF IRON DEFICIENCY ANEMIA FROM INTERMITTENT GI BLOOD LOSS RELATED TO SMALL BOWEL AVMS   Avascular necrosis ([place])    LEFT HIP - PT STATES HE WALKS WITH LIMP AND PAIN AT TIMES   AVM (arteriovenous malformation) of colon    HX OF GI BLOOD LOSS RELATED TO SMALL BOWEL AVM'S   GERD (gastroesophageal reflux disease)    Gout    Hyperlipemia    Hypertension    IgG lambda monoclonal gammopathy    "OF UNDETERMINED SIGNIFICANCE" - FOLLOWED BY DR. Beryle Beams   Lumbar pain    HX O PAIN LEFT LEG AND XRAYS BACK IN 2005 OR 2006 AND TOLD DISC RUBBING ON NERVE - GIVEN INJECTIONS LOWER BACK - DIDN'T SOLVE THE PAIN DOWN LEFT LEG   Lymphadenopathy, axillary 04/07/2013   3 cm right axillary node S/P BIOPSY - NOT MALIGNANT   MGUS (monoclonal gammopathy of unknown significance) 01/10/2016   Sickle cell trait (HCC)    Wears  glasses    Wears partial dentures    top and bottom partials    Past Surgical History:  Procedure Laterality Date   APPENDECTOMY     COLONOSCOPY     ENTEROSCOPY  08/24/2011   Procedure: ENTEROSCOPY;  Surgeon: Inda Castle, MD;  [facility];  Service: Endoscopy;  Laterality: N/A;   ENTEROSCOPY N/A 12/12/2021   Procedure: ENTEROSCOPY;  Surgeon: Doran Stabler, MD;  [facility];  Service: Gastroenterology;  Laterality: N/A;   HEMOSTASIS CLIP PLACEMENT  12/12/2021   Procedure: HEMOSTASIS CLIP PLACEMENT;  Surgeon: Doran Stabler, MD;  [facility]: [facility];  Service: Gastroenterology;;   HERNIA REPAIR     rt ing   HOT HEMOSTASIS N/A 12/12/2021   Procedure: HOT HEMOSTASIS (ARGON PLASMA COAGULATION/BICAP);  Surgeon: Doran Stabler, MD;  Location: [facility];  Service: Gastroenterology;  Laterality: N/A;   MASS EXCISION Right 09/01/2013   Procedure: EXCISION AXILLA  MASS;  Surgeon: Joyice Faster. Cornett, MD;  Location: [facility];  Service: General;  Laterality: Right;   TOTAL HIP ARTHROPLASTY Left 12/25/2013   Procedure: LEFT TOTAL HIP ARTHROPLASTY ANTERIOR APPROACH;  Surgeon: Mcarthur Rossetti, MD;  [facility];  Service: Orthopedics;  Laterality: Left;   TOTAL HIP ARTHROPLASTY Right 09/02/2020  Procedure: RIGHT TOTAL HIP ARTHROPLASTY ANTERIOR APPROACH;  Surgeon: Mcarthur Rossetti, MD;  [facility];  Service: Orthopedics;  Laterality: Right;    Current Medications: Current Meds  Medication Sig   allopurinol (ZYLOPRIM) 100 MG tablet Take 100 mg by mouth daily.   aspirin EC 81 MG tablet Take 81 mg by mouth daily. Swallow whole.   atenolol (TENORMIN) 25 MG tablet Take 25 mg by mouth every morning.   Cinnamon 500 MG capsule Take 500 mg by mouth daily.   ferrous sulfate 325 (65 FE) MG tablet Take 1 tablet (325 mg total) by mouth 3 (three) times daily with meals.   Multiple Vitamin (MULTIVITAMIN WITH MINERALS) TABS tablet Take  1 tablet by mouth daily. Centrum Silver   naproxen sodium (ALEVE) 220 MG tablet Take 220 mg by mouth daily as needed (Pain). As needed   vitamin C (ASCORBIC ACID) 500 MG tablet Take 500 mg by mouth daily.   Zinc Acetate, Oral, (ZINC ACETATE PO) Take 1 tablet by mouth daily.    [DISCONTINUED] atorvastatin (LIPITOR) 40 MG tablet Take 1 tablet (40 mg total) by mouth daily.     Allergies:   Patient has no known allergies.   Social History   Socioeconomic History   Marital status: Married    Spouse name: Not on file   Number of children: 1   Years of education: Not on file   Highest education level: Not on file  Occupational History   Occupation: Retired     Fish farm manager: RETIRED  Tobacco Use   Smoking status: Former    Types: Cigarettes    Quit date: 08/27/1992    Years since quitting: 29.6   Smokeless tobacco: Never  Vaping Use   Vaping Use: Never used  Substance and Sexual Activity   Alcohol use: Yes    Alcohol/week: 0.0 standard drinks of alcohol    Comment: Gin and beer every other day   Drug use: No   Sexual activity: Not on file  Other Topics Concern   Not on file  Social History Narrative   Not on file   Social Determinants of Health   Financial Resource Strain: Not on file  Food Insecurity: Not on file  Transportation Needs: Not on file  Physical Activity: Not on file  Stress: Not on file  Social Connections: Not on file     Family History:  The patient's  family history is not on file.   ROS:   Please see the history of present illness.    ROS All other systems reviewed and are negative.   PHYSICAL EXAM:   VS:  BP 118/60   Pulse 86   Ht '6\' 2"'$  (1.88 m)   Wt 216 lb 9.6 oz (98.2 kg)   SpO2 99%   BMI 27.81 kg/m   Physical Exam  GEN: Well nourished, well developed, in no acute distress  Neck: no JVD, carotid bruits, or masses Cardiac:RRR; no murmurs, rubs, or gallops  Respiratory:  clear to auscultation bilaterally, normal work of breathing GI: soft,  nontender, nondistended, + BS Ext: without cyanosis, clubbing, or edema, Good distal pulses bilaterally Neuro:  Alert and Oriented x 3, Psych: euthymic mood, full affect  Wt Readings from Last 3 Encounters:  04/25/22 216 lb 9.6 oz (98.2 kg)  01/11/22 218 lb 6.4 oz (99.1 kg)  12/14/21 220 lb 6.4 oz (100 kg)      Studies/Labs Reviewed:   EKG:  EKG is  ordered today.  The ekg  ordered today demonstrates NSR, normal EKG  Recent Labs: 07/17/2021: NT-Pro BNP 133 12/14/2021: BUN 13; Creatinine 1.25; Potassium 3.8; Sodium 141 02/13/2022: Hemoglobin 10.7; Platelet Count 162   Lipid Panel    Component Value Date/Time   CHOL 121 07/17/2021 0924   TRIG 104 07/17/2021 0924   HDL 57 07/17/2021 0924   CHOLHDL 2.1 07/17/2021 0924   LDLCALC 45 07/17/2021 0924    Additional studies/ records that were reviewed today include:  Echo 07/2021  IMPRESSIONS Inferior basal hypokinesis and abnormal GLS noted in inferior base as well . Left ventricular ejection fraction, by estimation, is 55%. The left ventricle has normal function. The left ventricle demonstrates regional wall motion abnormalities (see scoring diagram/findings for description). There is mild left ventricular hypertrophy. Left ventricular diastolic parameters were normal. 1. Right ventricular systolic function is normal. The right ventricular size is normal. There is normal pulmonary artery systolic pressure. 2. 3. Left atrial size was moderately dilated. The mitral valve is normal in structure. Trivial mitral valve regurgitation. No evidence of mitral stenosis. 4. The aortic valve was not well visualized. There is mild calcification of the aortic valve. Aortic valve regurgitation is not visualized. Aortic valve sclerosis is present, with no evidence of aortic valve stenosis. 5. The inferior vena cava is normal in size with greater than 50% respiratory variability, suggesting right atrial pressure of 3 mmHg. 6. FINDINGS Left  Ventricle: Inferior basal hypokinesis and abnormal GLS noted in inferior base as well. Left ventricular ejection fraction, by estimation, is 55%. The left ventricle has normal function. The left ventricle demonstrates regional wall motion abnormalities. The left ventricular internal cavity size was normal in size. There is mild left ventricular hypertrophy. Left ventricular diastolic parameters were normal. Final Page 1 of 3  Coronary CTA IMPRESSION: 1. Coronary calcium score of 564. This was 46 percentile for age and sex matched control.   2. Normal coronary origin with right dominance.   3. LAD and RCA calcified plaque, 0-24%, non flow limiting.    Risk Assessment/Calculations:         ASSESSMENT:    1. Coronary artery disease involving native coronary artery of native heart without angina pectoris   2. DOE (dyspnea on exertion)   3. Hypertension, unspecified type   4. Hyperlipidemia, unspecified hyperlipidemia type      PLAN:  In order of problems listed above:  CAD nonobstructive on Coronary CTA-no chest pain  DOE-echo normal LVEF, breathing has improved but still some DOE with yard work. Suspect due to deconditioning. Recommend increase exercise 20-30 min daily  HTN controlled on atenolol. Check labs today  HLD-on atorvastatin. Check FLP today(had an ensure)    Shared Decision Making/Informed Consent        Medication Adjustments/Labs and Tests Ordered: Current medicines are reviewed at length with the patient today.  Concerns regarding medicines are outlined above.  Medication changes, Labs and Tests ordered today are listed in the Patient Instructions below. Patient Instructions  Medication Instructions:  Your physician recommends that you continue on your current medications as directed. Please refer to the Current Medication list given to you today.  *If you need a refill on your cardiac medications before your next appointment, please call your  pharmacy*   Lab Work: TODAY: CMET, CBC, TSH  If you have labs (blood work) drawn today and your tests are completely normal, you will receive your results only by: [place] (if you have MyChart) OR A paper copy in the mail If you have  any lab test that is abnormal or we need to change your treatment, we will call you to review the results.  Follow-Up: At [facility], you and your health needs are our priority.  As part of our continuing mission to provide you with exceptional heart care, we have created designated Provider Care Teams.  These Care Teams include your primary Cardiologist (physician) and Advanced Practice Providers (APPs -  Physician Assistants and Nurse Practitioners) who all work together to provide you with the care you need, when you need it.   Your next appointment:   1 year(s)  The format for your next appointment:   In Person  Provider:   Werner Lean, MD      Signed, Ermalinda Barrios, PA-C  04/25/2022 10:13 AM    [address], [place], [place]  [address] Phone: [phone]; Fax: [phone]

## 2022-04-25 ENCOUNTER — Ambulatory Visit: Payer: Medicare Other | Admitting: Physician Assistant

## 2022-04-25 ENCOUNTER — Encounter: Payer: Self-pay | Admitting: Physician Assistant

## 2022-04-25 VITALS — BP 118/60 | HR 86 | Ht 74.0 in | Wt 216.6 lb

## 2022-04-25 DIAGNOSIS — E785 Hyperlipidemia, unspecified: Secondary | ICD-10-CM

## 2022-04-25 DIAGNOSIS — R0609 Other forms of dyspnea: Secondary | ICD-10-CM

## 2022-04-25 DIAGNOSIS — I10 Essential (primary) hypertension: Secondary | ICD-10-CM | POA: Diagnosis not present

## 2022-04-25 DIAGNOSIS — I2510 Atherosclerotic heart disease of native coronary artery without angina pectoris: Secondary | ICD-10-CM

## 2022-04-25 MED ORDER — ATORVASTATIN CALCIUM 40 MG PO TABS: 40.0000 mg | ORAL_TABLET | Freq: Every day | ORAL | 3 refills | Status: AC

## 2022-04-25 NOTE — Patient Instructions (Signed)
Medication Instructions:  Your physician recommends that you continue on your current medications as directed. Please refer to the Current Medication list given to you today.  *If you need a refill on your cardiac medications before your next appointment, please call your pharmacy*   Lab Work: TODAY: CMET, CBC, TSH  If you have labs (blood work) drawn today and your tests are completely normal, you will receive your results only by: [place] (if you have MyChart) OR A paper copy in the mail If you have any lab test that is abnormal or we need to change your treatment, we will call you to review the results.  Follow-Up: At [facility], you and your health needs are our priority.  As part of our continuing mission to provide you with exceptional heart care, we have created designated Provider Care Teams.  These Care Teams include your primary Cardiologist (physician) and Advanced Practice Providers (APPs -  Physician Assistants and Nurse Practitioners) who all work together to provide you with the care you need, when you need it.   Your next appointment:   1 year(s)  The format for your next appointment:   In Person  Provider:   Werner Lean, MD

## 2022-04-26 LAB — CBC
Hematocrit: 30.9 % — ABNORMAL LOW (ref 37.5–51.0)
Hemoglobin: 9.8 g/dL — ABNORMAL LOW (ref 13.0–17.7)
MCH: 28.6 pg (ref 26.6–33.0)
MCHC: 31.7 g/dL (ref 31.5–35.7)
MCV: 90 fL (ref 79–97)
Platelets: 221 10*3/uL (ref 150–450)
RBC: 3.43 x10E6/uL — ABNORMAL LOW (ref 4.14–5.80)
RDW: 13.1 % (ref 11.6–15.4)
WBC: 3.7 10*3/uL (ref 3.4–10.8)

## 2022-04-26 LAB — LIPID PANEL
Chol/HDL Ratio: 2.1 ratio (ref 0.0–5.0)
Cholesterol, Total: 121 mg/dL (ref 100–199)
HDL: 58 mg/dL (ref >39)
LDL Chol Calc (NIH): 44 mg/dL (ref 0–99)
Triglycerides: 106 mg/dL (ref 0–149)
VLDL Cholesterol Cal: 19 mg/dL (ref 5–40)

## 2022-04-26 LAB — COMPREHENSIVE METABOLIC PANEL
ALT: 14 IU/L (ref 0–44)
AST: 19 IU/L (ref 0–40)
Albumin/Globulin Ratio: 1.6 (ref 1.2–2.2)
Albumin: 4.1 g/dL (ref 3.8–4.8)
Alkaline Phosphatase: 58 IU/L (ref 44–121)
BUN/Creatinine Ratio: 12 (ref 10–24)
BUN: 15 mg/dL (ref 8–27)
Bilirubin Total: 0.3 mg/dL (ref 0.0–1.2)
CO2: 21 mmol/L (ref 20–29)
Calcium: 8.8 mg/dL (ref 8.6–10.2)
Chloride: 107 mmol/L — ABNORMAL HIGH (ref 96–106)
Creatinine, Ser: 1.25 mg/dL (ref 0.76–1.27)
Globulin, Total: 2.6 g/dL (ref 1.5–4.5)
Glucose: 114 mg/dL — ABNORMAL HIGH (ref 70–99)
Potassium: 4.4 mmol/L (ref 3.5–5.2)
Sodium: 140 mmol/L (ref 134–144)
Total Protein: 6.7 g/dL (ref 6.0–8.5)
eGFR: 59 mL/min/{1.73_m2} — ABNORMAL LOW (ref >59)

## 2022-04-26 LAB — TSH: TSH: 1.52 u[IU]/mL (ref 0.450–4.500)

## 2022-05-11 ENCOUNTER — Ambulatory Visit: Payer: Medicare Other | Admitting: Oncology

## 2022-05-11 ENCOUNTER — Other Ambulatory Visit: Payer: Medicare Other

## 2022-05-18 ENCOUNTER — Inpatient Hospital Stay: Payer: Medicare Other | Attending: Oncology | Admitting: Oncology

## 2022-05-18 ENCOUNTER — Inpatient Hospital Stay: Payer: Medicare Other

## 2022-05-18 VITALS — BP 125/71 | HR 82 | Temp 98.1°F | Resp 18 | Ht 74.0 in | Wt 216.8 lb

## 2022-05-18 DIAGNOSIS — D500 Iron deficiency anemia secondary to blood loss (chronic): Secondary | ICD-10-CM | POA: Diagnosis not present

## 2022-05-18 DIAGNOSIS — D472 Monoclonal gammopathy: Secondary | ICD-10-CM

## 2022-05-18 DIAGNOSIS — D509 Iron deficiency anemia, unspecified: Secondary | ICD-10-CM | POA: Insufficient documentation

## 2022-05-18 LAB — FERRITIN: Ferritin: 18 ng/mL — ABNORMAL LOW (ref 24–336)

## 2022-05-18 LAB — CBC WITH DIFFERENTIAL (CANCER CENTER ONLY)
Abs Immature Granulocytes: 0.02 10*3/uL (ref 0.00–0.07)
Basophils Absolute: 0 10*3/uL (ref 0.0–0.1)
Basophils Relative: 1 %
Eosinophils Absolute: 0.2 10*3/uL (ref 0.0–0.5)
Eosinophils Relative: 6 %
HCT: 34.1 % — ABNORMAL LOW (ref 39.0–52.0)
Hemoglobin: 10.8 g/dL — ABNORMAL LOW (ref 13.0–17.0)
Immature Granulocytes: 1 %
Lymphocytes Relative: 18 %
Lymphs Abs: 0.7 10*3/uL (ref 0.7–4.0)
MCH: 27.6 pg (ref 26.0–34.0)
MCHC: 31.7 g/dL (ref 30.0–36.0)
MCV: 87.2 fL (ref 80.0–100.0)
Monocytes Absolute: 0.7 10*3/uL (ref 0.1–1.0)
Monocytes Relative: 17 %
Neutro Abs: 2.3 10*3/uL (ref 1.7–7.7)
Neutrophils Relative %: 57 %
Platelet Count: 200 10*3/uL (ref 150–400)
RBC: 3.91 MIL/uL — ABNORMAL LOW (ref 4.22–5.81)
RDW: 14.8 % (ref 11.5–15.5)
WBC Count: 4 10*3/uL (ref 4.0–10.5)
nRBC: 0 % (ref 0.0–0.2)

## 2022-05-18 NOTE — Progress Notes (Signed)
  [facility] OFFICE PROGRESS NOTE   Diagnosis: Iron deficiency anemia, serum monoclonal protein  INTERVAL HISTORY:   [name] returns as scheduled.  He is taking iron twice daily.  No bleeding.  He reports fatigue when he walks uphill.  He received IV iron on 11/29/2021 and 12/14/2021.  He reports tolerating the iron well.  Objective:  Vital signs in last 24 hours:  Blood pressure 125/71, pulse 82, temperature 98.1 F (36.7 C), temperature source Oral, resp. rate 18, height '6\' 2"'$  (1.88 m), weight 216 lb 12.8 oz (98.3 kg), SpO2 100 %.     Resp: Lungs clear bilaterally Cardio: Regular rate and rhythm GI: No hepatosplenomegaly Vascular: No leg edema   Lab Results:  Lab Results  Component Value Date   WBC 4.0 05/18/2022   HGB 10.8 (L) 05/18/2022   HCT 34.1 (L) 05/18/2022   MCV 87.2 05/18/2022   PLT 200 05/18/2022   NEUTROABS 2.3 05/18/2022    CMP  Lab Results  Component Value Date   NA 140 04/25/2022   K 4.4 04/25/2022   CL 107 (H) 04/25/2022   CO2 21 04/25/2022   GLUCOSE 114 (H) 04/25/2022   BUN 15 04/25/2022   CREATININE 1.25 04/25/2022   CALCIUM 8.8 04/25/2022   PROT 6.7 04/25/2022   ALBUMIN 4.1 04/25/2022   AST 19 04/25/2022   ALT 14 04/25/2022   ALKPHOS 58 04/25/2022   BILITOT 0.3 04/25/2022   GFRNONAA 59 (L) 12/14/2021   GFRAA 69 07/08/2015    Medications: I have reviewed the patient's current medications.   Assessment/Plan: Iron deficiency anemia-chronic, most likely due to GI blood loss from AVMs Small bowel endoscopy with laser ablation of AVM and third portion of duodenum December 2012 Upper endoscopy 11/23/2021-small AVM in the duodenal bulb with adjacent fresh heme IV Venofer 300 mg 11/29/2021 Upper endoscopy 12/12/2021-bleeding AVM in the stomach and duodenum, treated with argon plasma coagulation IV Venofer 300 mg 11/29/2021, 12/14/2021 History of a serum IgG lambda monoclonal protein 11/27/2021 SPEP-no M spike observed,  immunofixation shows IgG monoclonal protein with lambda light chain specificity Excision of a benign reactive Lex axillary lymph node 09/01/2013 Sickle cell trait Hypertension Gout Hyperlipidemia Status post bilateral hip replacement   Disposition: [name] has a history of iron deficiency anemia.  He received IV iron in March and continues oral iron.  The ferritin remains low.  The exertional fatigue/dyspnea may be related to persistent anemia.  He agrees to another course of IV iron.  He will be scheduled for Venofer weekly for 3 doses.  He will return for an office visit and CBC in 3 months.  We will refer him back to Dr. Loletha Carrow to consider repeat endoscopy if he continues to have iron deficiency.  We will follow-up on the serum monoclonal protein after the next office visit.  Betsy Coder, MD  05/18/2022  12:29 PM

## 2022-05-18 NOTE — Patient Instructions (Signed)

## 2022-05-24 ENCOUNTER — Inpatient Hospital Stay: Payer: Medicare Other

## 2022-05-24 VITALS — BP 119/61 | HR 64 | Temp 98.4°F | Resp 18 | Ht 74.0 in | Wt 215.4 lb

## 2022-05-24 DIAGNOSIS — D509 Iron deficiency anemia, unspecified: Secondary | ICD-10-CM | POA: Diagnosis not present

## 2022-05-24 DIAGNOSIS — D500 Iron deficiency anemia secondary to blood loss (chronic): Secondary | ICD-10-CM

## 2022-05-24 MED ORDER — SODIUM CHLORIDE 0.9 % IV SOLN: Freq: Once | INTRAVENOUS | Status: AC

## 2022-05-24 MED ORDER — SODIUM CHLORIDE 0.9 % IV SOLN
300.0000 mg | Freq: Once | INTRAVENOUS | Status: AC
  Administered 2022-05-24: 300 mg via INTRAVENOUS
  Filled 2022-05-24: qty 200

## 2022-05-24 NOTE — Patient Instructions (Signed)

## 2022-05-31 ENCOUNTER — Inpatient Hospital Stay: Payer: Medicare Other

## 2022-05-31 VITALS — BP 115/63 | HR 72 | Temp 98.1°F | Resp 18 | Wt 231.6 lb

## 2022-05-31 DIAGNOSIS — D509 Iron deficiency anemia, unspecified: Secondary | ICD-10-CM | POA: Diagnosis not present

## 2022-05-31 DIAGNOSIS — D500 Iron deficiency anemia secondary to blood loss (chronic): Secondary | ICD-10-CM

## 2022-05-31 MED ORDER — SODIUM CHLORIDE 0.9 % IV SOLN: Freq: Once | INTRAVENOUS | Status: AC

## 2022-05-31 MED ORDER — SODIUM CHLORIDE 0.9 % IV SOLN
300.0000 mg | Freq: Once | INTRAVENOUS | Status: AC
  Administered 2022-05-31: 300 mg via INTRAVENOUS
  Filled 2022-05-31: qty 300

## 2022-05-31 NOTE — Patient Instructions (Signed)

## 2022-06-07 ENCOUNTER — Inpatient Hospital Stay: Payer: Medicare Other

## 2022-06-07 VITALS — BP 118/75 | HR 60 | Temp 98.0°F | Resp 18 | Ht 74.0 in | Wt 216.2 lb

## 2022-06-07 DIAGNOSIS — D500 Iron deficiency anemia secondary to blood loss (chronic): Secondary | ICD-10-CM

## 2022-06-07 DIAGNOSIS — D509 Iron deficiency anemia, unspecified: Secondary | ICD-10-CM | POA: Diagnosis not present

## 2022-06-07 MED ORDER — SODIUM CHLORIDE 0.9 % IV SOLN
300.0000 mg | Freq: Once | INTRAVENOUS | Status: AC
  Administered 2022-06-07: 300 mg via INTRAVENOUS
  Filled 2022-06-07: qty 10

## 2022-06-07 MED ORDER — SODIUM CHLORIDE 0.9 % IV SOLN: Freq: Once | INTRAVENOUS | Status: AC

## 2022-06-07 NOTE — Patient Instructions (Signed)

## 2022-07-18 NOTE — Progress Notes (Signed)
[facility]   [phone] [phone]   Clinic Follow up Note   Patient Care Team: Tisovec, Fransico Him, MD as PCP - General (Internal Medicine) Werner Lean, MD as PCP - Cardiology (Cardiology) Ladell Pier, MD as Consulting Physician (Oncology) 07/20/2022  CHIEF COMPLAINT: Follow up IDA and IgG lambda monoclonal protein  CURRENT THERAPY: IV Iron (venofer 300 mg) PRN  INTERVAL HISTORY: Mr. Knerr returns for follow up as scheduled, last seen by Dr. Benay Spice 05/18/22 and subsequently received Venofer on 9/7, 9/14, and 9/21, which he tolerated well.  His energy has improved, feels more "vibrant."  Denies exertional dyspnea, no bleeding.  Constipation improved recently, takes stool softener as needed.  All other systems were reviewed with the patient and are negative.  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 Beryle Beams   Lumbar pain    HX O PAIN LEFT LEG AND XRAYS BACK IN 2005 OR 2006 AND TOLD DISC RUBBING ON NERVE - GIVEN INJECTIONS LOWER BACK - DIDN'T SOLVE THE PAIN DOWN LEFT LEG   Lymphadenopathy, axillary 04/07/2013   3 cm right axillary node S/P BIOPSY - NOT MALIGNANT   MGUS (monoclonal gammopathy of unknown significance) 01/10/2016   Sickle cell trait (HCC)    Wears glasses    Wears partial dentures    top and bottom partials    SURGICAL HISTORY: Past Surgical History:  Procedure Laterality Date    APPENDECTOMY     COLONOSCOPY     ENTEROSCOPY  08/24/2011   Procedure: ENTEROSCOPY;  Surgeon: Inda Castle, MD;  [facility];  Service: Endoscopy;  Laterality: N/A;   ENTEROSCOPY N/A 12/12/2021   Procedure: ENTEROSCOPY;  Surgeon: Doran Stabler, MD;  [facility];  Service: Gastroenterology;  Laterality: N/A;   HEMOSTASIS CLIP PLACEMENT  12/12/2021   Procedure: HEMOSTASIS CLIP PLACEMENT;  Surgeon: Doran Stabler, MD;  [facility];  Service: Gastroenterology;;   HERNIA REPAIR     rt ing   HOT HEMOSTASIS N/A 12/12/2021   Procedure: HOT HEMOSTASIS (ARGON PLASMA COAGULATION/BICAP);  Surgeon: Doran Stabler, MD;  Location: [facility];  Service: Gastroenterology;  Laterality: N/A;   MASS EXCISION Right 09/01/2013   Procedure: EXCISION AXILLA  MASS;  Surgeon: Joyice Faster. Cornett, MD;  Location: [facility];  Service: General;  Laterality: Right;   TOTAL HIP ARTHROPLASTY Left 12/25/2013   Procedure: LEFT TOTAL HIP ARTHROPLASTY ANTERIOR APPROACH;  Surgeon: Mcarthur Rossetti, MD;  [facility];  Service: Orthopedics;  Laterality: Left;   TOTAL HIP ARTHROPLASTY Right 09/02/2020   Procedure: RIGHT TOTAL HIP ARTHROPLASTY ANTERIOR APPROACH;  Surgeon: Mcarthur Rossetti, MD;  [facility];  Service: Orthopedics;  Laterality: Right;    I have reviewed the social history and family history with the patient and they are unchanged from previous note.  ALLERGIES:  has No Known Allergies.  MEDICATIONS:  Current Outpatient Medications  Medication Sig Dispense Refill   allopurinol (ZYLOPRIM) 100 MG tablet Take 100 mg by mouth daily.     aspirin EC 81 MG tablet Take 81 mg by mouth daily. Swallow whole.     atenolol (TENORMIN) 25 MG tablet Take 25 mg by mouth every morning.     atorvastatin (LIPITOR) 40 MG tablet Take 1 tablet (40 mg total) by mouth daily. 90 tablet 3   Cinnamon 500 MG capsule Take 500 mg by mouth daily.     ferrous  sulfate 325 (65 FE) MG tablet Take 1 tablet (325 mg total) by mouth 3 (three) times daily with meals. 90 tablet 0   Multiple Vitamin (MULTIVITAMIN WITH MINERALS) TABS tablet Take 1 tablet by mouth daily. Centrum Silver     naproxen sodium (ALEVE) 220 MG tablet Take 220 mg by mouth daily as needed (Pain). As needed     vitamin C (ASCORBIC ACID) 500 MG tablet Take 500 mg by mouth daily.     Zinc Acetate, Oral, (ZINC ACETATE PO) Take 1 tablet by mouth daily.  (Patient not taking: Reported on 07/20/2022)     No current facility-administered medications for this visit.    PHYSICAL EXAMINATION:  Vitals:   07/20/22 0826  BP: 136/73  Pulse: 88  Resp: 18  Temp: 98.2 F (36.8 C)  SpO2: 96%   Filed Weights   07/20/22 0826  Weight: 218 lb 6.4 oz (99.1 kg)    GENERAL:alert, no distress and comfortable LUNGS: clear with normal breathing effort HEART: regular rate & rhythm NEURO: alert & oriented x 3 with fluent speech  LABORATORY DATA:  I have reviewed the data as listed    Latest Ref Rng & Units 07/20/2022    8:12 AM 05/18/2022   11:13 AM 04/25/2022   10:18 AM  CBC  WBC 4.0 - 10.5 K/uL 4.1  4.0  3.7   Hemoglobin 13.0 - 17.0 g/dL 12.9  10.8  9.8   Hematocrit 39.0 - 52.0 % 39.7  34.1  30.9   Platelets 150 - 400 K/uL 155  200  221         Latest Ref Rng & Units 04/25/2022   10:18 AM 12/14/2021   11:20 AM 08/23/2021    9:55 AM  CMP  Glucose 70 - 99 mg/dL 114  106  143   BUN 8 - 27 mg/dL '15  13  15   '$ Creatinine 0.76 - 1.27 mg/dL 1.25  1.25  1.35   Sodium 134 - 144 mmol/L 140  141  143   Potassium 3.5 - 5.2 mmol/L 4.4  3.8  3.9   Chloride 96 - 106 mmol/L 107  110  109   CO2 20 - 29 mmol/L '21  22  21   '$ Calcium 8.6 - 10.2 mg/dL 8.8  8.9  8.6   Total Protein 6.0 - 8.5 g/dL 6.7     Total Bilirubin 0.0 - 1.2 mg/dL 0.3     Alkaline Phos 44 - 121 IU/L 58     AST 0 - 40 IU/L 19     ALT 0 - 44 IU/L 14         RADIOGRAPHIC STUDIES: I have personally reviewed the radiological images as  listed and agreed with the findings in the report. No results found.   ASSESSMENT & PLAN:   Iron deficiency anemia-chronic, most likely due to GI blood loss from AVMs Small bowel  endoscopy with laser ablation of AVM and third portion of duodenum December 2012 Upper endoscopy 11/23/2021-small AVM in the duodenal bulb with adjacent fresh heme IV Venofer 300 mg 11/29/2021 Upper endoscopy 12/12/2021-bleeding AVM in the stomach and duodenum, treated with argon plasma coagulation IV Venofer 300 mg 11/29/2021, 12/14/2021 IV Venofer 300 mg 05/24/2022, 05/31/2022, and 06/07/2022 2. History of a serum IgG lambda monoclonal protein 11/27/2021 SPEP-no M spike observed, immunofixation shows IgG monoclonal protein with lambda light chain specificity Excision of a benign reactive Lex axillary lymph node 09/01/2013 Sickle cell trait Hypertension Gout Hyperlipidemia Status post bilateral hip replacement   Disposition:  Mr. Cuffee is doing well. He tolerated IV venofer x3 and symptoms of anemia improved. Hgb 12.9 and ferritin is 47 today. His IDA nearly resolved. He will continue oral iron BID. We will see him back for lab and office visit in 4 months to monitor IDA and MGUS. He knows to call sooner if he has bleeding or recurrent symptoms of anemia.    Orders Placed This Encounter  Procedures   CBC with Differential ([place] Only)    Standing Status:   Future    Standing Expiration Date:   07/21/2023   Ferritin    Standing Status:   Future    Standing Expiration Date:   07/21/2023   Kappa/lambda light chains    Standing Status:   Future    Standing Expiration Date:   07/20/2023   SPEP with reflex to IFE    Standing Status:   Future    Standing Expiration Date:   07/20/2023   All questions were answered. The patient knows to call the clinic with any problems, questions or concerns. No barriers to learning were detected.      Alla Feeling, NP 07/20/22

## 2022-07-20 ENCOUNTER — Inpatient Hospital Stay: Payer: Medicare Other | Attending: Oncology

## 2022-07-20 ENCOUNTER — Inpatient Hospital Stay: Payer: Medicare Other | Admitting: Nurse Practitioner

## 2022-07-20 ENCOUNTER — Inpatient Hospital Stay: Payer: Medicare Other

## 2022-07-20 ENCOUNTER — Encounter: Payer: Self-pay | Admitting: Nurse Practitioner

## 2022-07-20 ENCOUNTER — Telehealth: Payer: Self-pay

## 2022-07-20 VITALS — BP 136/73 | HR 88 | Temp 98.2°F | Resp 18 | Ht 74.0 in | Wt 218.4 lb

## 2022-07-20 DIAGNOSIS — D500 Iron deficiency anemia secondary to blood loss (chronic): Secondary | ICD-10-CM

## 2022-07-20 DIAGNOSIS — D472 Monoclonal gammopathy: Secondary | ICD-10-CM | POA: Diagnosis not present

## 2022-07-20 DIAGNOSIS — D509 Iron deficiency anemia, unspecified: Secondary | ICD-10-CM | POA: Diagnosis not present

## 2022-07-20 LAB — CBC WITH DIFFERENTIAL (CANCER CENTER ONLY)
Abs Immature Granulocytes: 0.02 10*3/uL (ref 0.00–0.07)
Basophils Absolute: 0 10*3/uL (ref 0.0–0.1)
Basophils Relative: 1 %
Eosinophils Absolute: 0.3 10*3/uL (ref 0.0–0.5)
Eosinophils Relative: 6 %
HCT: 39.7 % (ref 39.0–52.0)
Hemoglobin: 12.9 g/dL — ABNORMAL LOW (ref 13.0–17.0)
Immature Granulocytes: 1 %
Lymphocytes Relative: 27 %
Lymphs Abs: 1.1 10*3/uL (ref 0.7–4.0)
MCH: 28.5 pg (ref 26.0–34.0)
MCHC: 32.5 g/dL (ref 30.0–36.0)
MCV: 87.8 fL (ref 80.0–100.0)
Monocytes Absolute: 0.7 10*3/uL (ref 0.1–1.0)
Monocytes Relative: 16 %
Neutro Abs: 2 10*3/uL (ref 1.7–7.7)
Neutrophils Relative %: 49 %
Platelet Count: 155 10*3/uL (ref 150–400)
RBC: 4.52 MIL/uL (ref 4.22–5.81)
RDW: 16.1 % — ABNORMAL HIGH (ref 11.5–15.5)
WBC Count: 4.1 10*3/uL (ref 4.0–10.5)
nRBC: 0 % (ref 0.0–0.2)

## 2022-07-20 LAB — FERRITIN: Ferritin: 47 ng/mL (ref 24–336)

## 2022-07-20 NOTE — Telephone Encounter (Signed)
Could you please call pt to let him know Ferritin is 47, improved to normal after IV iron. no further IV iron at this time, just continue oral iron BID. same f/up plan as we discussed today.  Patient gave verbal understanding and had no further questions or concerns at this time.

## 2022-11-16 ENCOUNTER — Inpatient Hospital Stay: Payer: Medicare Other | Attending: Oncology

## 2022-11-16 ENCOUNTER — Inpatient Hospital Stay: Payer: Medicare Other | Admitting: Nurse Practitioner

## 2022-11-16 ENCOUNTER — Encounter: Payer: Self-pay | Admitting: Nurse Practitioner

## 2022-11-16 ENCOUNTER — Telehealth: Payer: Self-pay

## 2022-11-16 VITALS — BP 138/71 | HR 86 | Temp 98.1°F | Resp 18 | Ht 74.0 in | Wt 215.2 lb

## 2022-11-16 DIAGNOSIS — D500 Iron deficiency anemia secondary to blood loss (chronic): Secondary | ICD-10-CM | POA: Diagnosis not present

## 2022-11-16 DIAGNOSIS — D472 Monoclonal gammopathy: Secondary | ICD-10-CM

## 2022-11-16 DIAGNOSIS — D509 Iron deficiency anemia, unspecified: Secondary | ICD-10-CM | POA: Insufficient documentation

## 2022-11-16 LAB — CBC WITH DIFFERENTIAL (CANCER CENTER ONLY)
Abs Immature Granulocytes: 0.02 10*3/uL (ref 0.00–0.07)
Basophils Absolute: 0 10*3/uL (ref 0.0–0.1)
Basophils Relative: 1 %
Eosinophils Absolute: 0.2 10*3/uL (ref 0.0–0.5)
Eosinophils Relative: 4 %
HCT: 40 % (ref 39.0–52.0)
Hemoglobin: 13.6 g/dL (ref 13.0–17.0)
Immature Granulocytes: 1 %
Lymphocytes Relative: 21 %
Lymphs Abs: 0.8 10*3/uL (ref 0.7–4.0)
MCH: 30.8 pg (ref 26.0–34.0)
MCHC: 34 g/dL (ref 30.0–36.0)
MCV: 90.5 fL (ref 80.0–100.0)
Monocytes Absolute: 0.6 10*3/uL (ref 0.1–1.0)
Monocytes Relative: 17 %
Neutro Abs: 2.2 10*3/uL (ref 1.7–7.7)
Neutrophils Relative %: 56 %
Platelet Count: 156 10*3/uL (ref 150–400)
RBC: 4.42 MIL/uL (ref 4.22–5.81)
RDW: 13.1 % (ref 11.5–15.5)
WBC Count: 3.8 10*3/uL — ABNORMAL LOW (ref 4.0–10.5)
nRBC: 0 % (ref 0.0–0.2)

## 2022-11-16 LAB — FERRITIN: Ferritin: 42 ng/mL (ref 24–336)

## 2022-11-16 NOTE — Telephone Encounter (Signed)
-----   Message from Owens Shark, NP sent at 11/16/2022 11:16 AM EST ----- Please let him know ferritin level is stable in normal range.  He can decrease oral iron to 1 tablet daily.  Follow-up as scheduled.

## 2022-11-16 NOTE — Telephone Encounter (Signed)
Patient gave verbal understanding and had no further questions or concerns  

## 2022-11-16 NOTE — Progress Notes (Signed)
  [place] OFFICE PROGRESS NOTE   Diagnosis: Iron deficiency anemia, serum monoclonal protein  INTERVAL HISTORY:   [name] returns as scheduled.  He feels well.  He notices that he no longer has dyspnea on exertion.  Energy level is better.  He continues 2 iron pills a day.  Mild constipation relieved with a stool softener.  No nausea.  He denies bleeding.  Objective:  Vital signs in last 24 hours:  Blood pressure 138/71, pulse 86, temperature 98.1 F (36.7 C), temperature source Oral, resp. rate 18, height '6\' 2"'$  (1.88 m), weight 215 lb 3.2 oz (97.6 kg), SpO2 96 %.    HEENT: No thrush or ulcers. Resp: Lungs clear bilaterally. Cardio: Regular rate and rhythm. GI: No hepatosplenomegaly. Vascular: No leg edema.   Lab Results:  Lab Results  Component Value Date   WBC 3.8 (L) 11/16/2022   HGB 13.6 11/16/2022   HCT 40.0 11/16/2022   MCV 90.5 11/16/2022   PLT 156 11/16/2022   NEUTROABS 2.2 11/16/2022    Imaging:  No results found.  Medications: I have reviewed the patient's current medications.  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 History of a serum IgG lambda monoclonal protein 11/27/2021 SPEP-no M spike observed, immunofixation shows IgG monoclonal protein with lambda light chain specificity Excision of a benign reactive Lex axillary lymph node 09/01/2013 Sickle cell trait Hypertension Gout Hyperlipidemia Status post bilateral hip replacement    Disposition: [name] appears stable.   Hemoglobin has corrected into normal range.  Ferritin is stable.  He will decrease oral iron to 1 tablet daily.  He has history of a serum IgG lambda monoclonal protein.  We will follow-up on the SPEP/IFE from today.  He will return for lab and follow-up in 4 months.  He understands to contact the office in the interim with signs/symptoms suggestive of anemia.    Ned Card ANP/GNP-BC   11/16/2022  10:51 AM

## 2022-11-19 LAB — KAPPA/LAMBDA LIGHT CHAINS
Kappa free light chain: 43.4 mg/L — ABNORMAL HIGH (ref 3.3–19.4)
Kappa, lambda light chain ratio: 0.67 (ref 0.26–1.65)
Lambda free light chains: 64.9 mg/L — ABNORMAL HIGH (ref 5.7–26.3)

## 2022-11-22 LAB — PROTEIN ELECTROPHORESIS, SERUM, WITH REFLEX
A/G Ratio: 1.1 (ref 0.7–1.7)
Albumin ELP: 4.1 g/dL (ref 2.9–4.4)
Alpha-1-Globulin: 0.2 g/dL (ref 0.0–0.4)
Alpha-2-Globulin: 0.7 g/dL (ref 0.4–1.0)
Beta Globulin: 1.2 g/dL (ref 0.7–1.3)
Gamma Globulin: 1.6 g/dL (ref 0.4–1.8)
Globulin, Total: 3.6 g/dL (ref 2.2–3.9)
Total Protein ELP: 7.7 g/dL (ref 6.0–8.5)

## 2022-11-27 ENCOUNTER — Other Ambulatory Visit: Payer: Self-pay | Admitting: Nurse Practitioner

## 2022-11-27 DIAGNOSIS — D472 Monoclonal gammopathy: Secondary | ICD-10-CM

## 2022-11-27 DIAGNOSIS — D500 Iron deficiency anemia secondary to blood loss (chronic): Secondary | ICD-10-CM

## 2023-03-18 ENCOUNTER — Inpatient Hospital Stay: Payer: Medicare Other | Attending: Oncology

## 2023-03-18 ENCOUNTER — Inpatient Hospital Stay: Payer: Medicare Other | Admitting: Oncology

## 2023-03-18 ENCOUNTER — Other Ambulatory Visit: Payer: Self-pay | Admitting: Nurse Practitioner

## 2023-03-18 DIAGNOSIS — D509 Iron deficiency anemia, unspecified: Secondary | ICD-10-CM | POA: Diagnosis not present

## 2023-03-18 DIAGNOSIS — D500 Iron deficiency anemia secondary to blood loss (chronic): Secondary | ICD-10-CM

## 2023-03-18 DIAGNOSIS — D472 Monoclonal gammopathy: Secondary | ICD-10-CM

## 2023-03-18 LAB — CBC WITH DIFFERENTIAL (CANCER CENTER ONLY)
Abs Immature Granulocytes: 0.02 10*3/uL (ref 0.00–0.07)
Basophils Absolute: 0 10*3/uL (ref 0.0–0.1)
Basophils Relative: 1 %
Eosinophils Absolute: 0.2 10*3/uL (ref 0.0–0.5)
Eosinophils Relative: 5 %
HCT: 39.1 % (ref 39.0–52.0)
Hemoglobin: 13 g/dL (ref 13.0–17.0)
Immature Granulocytes: 1 %
Lymphocytes Relative: 24 %
Lymphs Abs: 0.9 10*3/uL (ref 0.7–4.0)
MCH: 30.3 pg (ref 26.0–34.0)
MCHC: 33.2 g/dL (ref 30.0–36.0)
MCV: 91.1 fL (ref 80.0–100.0)
Monocytes Absolute: 0.7 10*3/uL (ref 0.1–1.0)
Monocytes Relative: 18 %
Neutro Abs: 2.1 10*3/uL (ref 1.7–7.7)
Neutrophils Relative %: 51 %
Platelet Count: 145 10*3/uL — ABNORMAL LOW (ref 150–400)
RBC: 4.29 MIL/uL (ref 4.22–5.81)
RDW: 13.4 % (ref 11.5–15.5)
WBC Count: 3.9 10*3/uL — ABNORMAL LOW (ref 4.0–10.5)
nRBC: 0 % (ref 0.0–0.2)

## 2023-03-18 LAB — CMP (CANCER CENTER ONLY)
ALT: 22 U/L (ref 0–44)
AST: 22 U/L (ref 15–41)
Albumin: 4 g/dL (ref 3.5–5.0)
Alkaline Phosphatase: 45 U/L (ref 38–126)
Anion gap: 7 (ref 5–15)
BUN: 14 mg/dL (ref 8–23)
CO2: 25 mmol/L (ref 22–32)
Calcium: 8.8 mg/dL — ABNORMAL LOW (ref 8.9–10.3)
Chloride: 109 mmol/L (ref 98–111)
Creatinine: 1.34 mg/dL — ABNORMAL HIGH (ref 0.61–1.24)
GFR, Estimated: 54 mL/min — ABNORMAL LOW (ref >60)
Glucose, Bld: 93 mg/dL (ref 70–99)
Potassium: 4.1 mmol/L (ref 3.5–5.1)
Sodium: 141 mmol/L (ref 135–145)
Total Bilirubin: 0.6 mg/dL (ref 0.3–1.2)
Total Protein: 7.6 g/dL (ref 6.5–8.1)

## 2023-03-18 LAB — FERRITIN: Ferritin: 31 ng/mL (ref 24–336)

## 2023-03-19 ENCOUNTER — Telehealth: Payer: Self-pay | Admitting: Nurse Practitioner

## 2023-03-19 LAB — KAPPA/LAMBDA LIGHT CHAINS
Kappa free light chain: 36.6 mg/L — ABNORMAL HIGH (ref 3.3–19.4)
Kappa, lambda light chain ratio: 0.59 (ref 0.26–1.65)
Lambda free light chains: 61.8 mg/L — ABNORMAL HIGH (ref 5.7–26.3)

## 2023-03-23 LAB — MULTIPLE MYELOMA PANEL, SERUM
Albumin SerPl Elph-Mcnc: 3.5 g/dL (ref 2.9–4.4)
Albumin/Glob SerPl: 1.1 (ref 0.7–1.7)
Alpha 1: 0.2 g/dL (ref 0.0–0.4)
Alpha2 Glob SerPl Elph-Mcnc: 0.6 g/dL (ref 0.4–1.0)
B-Globulin SerPl Elph-Mcnc: 1.2 g/dL (ref 0.7–1.3)
Gamma Glob SerPl Elph-Mcnc: 1.4 g/dL (ref 0.4–1.8)
Globulin, Total: 3.4 g/dL (ref 2.2–3.9)
IgA: 323 mg/dL (ref 61–437)
IgG (Immunoglobin G), Serum: 1493 mg/dL (ref 603–1613)
IgM (Immunoglobulin M), Srm: 55 mg/dL (ref 15–143)
Total Protein ELP: 6.9 g/dL (ref 6.0–8.5)

## 2023-03-28 ENCOUNTER — Inpatient Hospital Stay (HOSPITAL_BASED_OUTPATIENT_CLINIC_OR_DEPARTMENT_OTHER): Payer: Medicare Other | Admitting: Nurse Practitioner

## 2023-03-28 ENCOUNTER — Encounter: Payer: Self-pay | Admitting: Nurse Practitioner

## 2023-03-28 VITALS — BP 127/63 | HR 81 | Temp 98.2°F | Resp 16 | Wt 215.5 lb

## 2023-03-28 DIAGNOSIS — D472 Monoclonal gammopathy: Secondary | ICD-10-CM

## 2023-03-28 DIAGNOSIS — D509 Iron deficiency anemia, unspecified: Secondary | ICD-10-CM | POA: Diagnosis not present

## 2023-03-28 DIAGNOSIS — D500 Iron deficiency anemia secondary to blood loss (chronic): Secondary | ICD-10-CM

## 2023-03-28 NOTE — Progress Notes (Signed)
  [facility] OFFICE PROGRESS NOTE   Diagnosis: Iron deficiency anemia, serum monoclonal protein  INTERVAL HISTORY:   [name] returns as scheduled.  He reports improvement in his energy level.  He feels "less winded".  No bleeding.  He denies pain.  He has a good appetite.  No change in bowel habits.  No urinary symptoms.  Objective:  Vital signs in last 24 hours:  Blood pressure 127/63, pulse 81, temperature 98.2 F (36.8 C), temperature source Temporal, resp. rate 16, weight 215 lb 8 oz (97.8 kg), SpO2 95%.    HEENT: No thrush or ulcers. Lymphatics: No palpable cervical, supraclavicular or axillary lymph nodes. Resp: Lungs clear bilaterally. Cardio: Regular rate and rhythm. GI: No hepatosplenomegaly. Vascular: No leg edema.   Lab Results:  Lab Results  Component Value Date   WBC 3.9 (L) 03/18/2023   HGB 13.0 03/18/2023   HCT 39.1 03/18/2023   MCV 91.1 03/18/2023   PLT 145 (L) 03/18/2023   NEUTROABS 2.1 03/18/2023    Imaging:  No results found.  Medications: I have reviewed the patient's current medications.  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 History of a serum IgG lambda monoclonal protein 11/27/2021 SPEP-no M spike observed, immunofixation shows IgG monoclonal protein with lambda light chain specificity 03/28/2023 SPEP-no M spike observed, normal immunoglobulins, immunofixation pattern unremarkable, no evidence of monoclonal protein, stable serum light chains Excision of a benign reactive  Lex axillary lymph node 09/01/2013 Sickle cell trait Hypertension Gout Hyperlipidemia Status post bilateral hip replacement  Disposition: [name] appears stable.  We reviewed the labs from 03/18/2023.  Hemoglobin and ferritin remain in normal range.  Myeloma panel showed no evidence of monoclonal protein, stable serum light chains.  He will return for an office visit and lab in 4 months.  He will contact the office in the interim with any problems.  We specifically discussed signs/symptoms suggestive of progressive anemia.    Lonna Cobb ANP/GNP-BC   03/28/2023  12:12 PM

## 2023-06-19 ENCOUNTER — Ambulatory Visit: Payer: Medicare Other | Attending: Internal Medicine | Admitting: Internal Medicine

## 2023-06-19 ENCOUNTER — Encounter: Payer: Self-pay | Admitting: Internal Medicine

## 2023-06-19 VITALS — BP 110/62 | HR 95 | Ht 74.0 in | Wt 215.0 lb

## 2023-06-19 DIAGNOSIS — E785 Hyperlipidemia, unspecified: Secondary | ICD-10-CM

## 2023-06-19 DIAGNOSIS — I10 Essential (primary) hypertension: Secondary | ICD-10-CM

## 2023-06-19 DIAGNOSIS — I700 Atherosclerosis of aorta: Secondary | ICD-10-CM | POA: Diagnosis not present

## 2023-06-19 DIAGNOSIS — D472 Monoclonal gammopathy: Secondary | ICD-10-CM

## 2023-06-19 DIAGNOSIS — I2510 Atherosclerotic heart disease of native coronary artery without angina pectoris: Secondary | ICD-10-CM

## 2023-06-19 NOTE — Progress Notes (Signed)
Cardiology Office Note:    Date:  06/19/2023   ID:  Adonay Scheier, DOB 03-24-1941, MRN [id number]  PCP:  Gaspar Garbe, MD   [facility]  Cardiologist:  Christell Constant, MD  Advanced Practice Provider:  No care team member to display Electrophysiologist:  None      CC: Secondary prevention  History of Present Illness:    Domenique Southers is a 82 y.o. male with a hx of of MGUS, Sickle Cell Trait, Aortic Atherosclerosis and CAC, history of HTN who presents for evaluation 01/16/21. In interim of this visit, patient had negative cardiac CT (only minimal non obstructive CAD).  2023: Improvement in symptoms without starting diuretic or stopping BB (saw Rudene Christians)  Discussed the use of AI scribe software for clinical note transcription with the patient, who gave verbal consent to proceed.  History of Present Illness         Mr. Vitolo, an 82 year old male with a history of monoclonal gammopathy of unknown significance, aortic atherosclerosis, and coronary artery calcifications, presents for a routine follow-up. In 2023 his symptoms resolved with no therapy changes, suggesting a non-cardiac etiology, possibly relative deconditioning. He has some degree of pulmonary artery dilation suggesting some degree of pulmonary hypertension, and his RV was dilated compared to his left ventricle.    He reports feeling good and has no issues with his breathing. He denies any chest discomfort, lightheadedness, palpitations, swelling in his legs, or fatigue. He also denies any breathing issues during sleep. His most active activities include yard work and using a stationary bike, which he can do without any issues.  He notes a prior smoking history of over 30 years, but being over 30 years smoke free.  Now able to do yard work without symptoms.   Past Medical History:  Diagnosis Date   Abnormal CT of the chest 04/16/13    Anemia    sees dr Denman George OF IRON DEFICIENCY ANEMIA  FROM INTERMITTENT GI BLOOD LOSS RELATED TO SMALL BOWEL AVMS   Avascular necrosis (HCC)    LEFT HIP - PT STATES HE WALKS WITH LIMP AND PAIN AT TIMES   AVM (arteriovenous malformation) of colon    HX OF GI BLOOD LOSS RELATED TO SMALL BOWEL AVM'S   GERD (gastroesophageal reflux disease)    Gout    Hyperlipemia    Hypertension    IgG lambda monoclonal gammopathy    "OF UNDETERMINED SIGNIFICANCE" - FOLLOWED BY DR. Cyndie Chime   Lumbar pain    HX O PAIN LEFT LEG AND XRAYS BACK IN 2005 OR 2006 AND TOLD DISC RUBBING ON NERVE - GIVEN INJECTIONS LOWER BACK - DIDN'T SOLVE THE PAIN DOWN LEFT LEG   Lymphadenopathy, axillary 04/07/2013   3 cm right axillary node S/P BIOPSY - NOT MALIGNANT   MGUS (monoclonal gammopathy of unknown significance) 01/10/2016   Sickle cell trait (HCC)    Wears glasses    Wears partial dentures    top and bottom partials    Past Surgical History:  Procedure Laterality Date   APPENDECTOMY     COLONOSCOPY     ENTEROSCOPY  08/24/2011   Procedure: ENTEROSCOPY;  Surgeon: Louis Meckel, MD;  Location: [facility];  Service: Endoscopy;  Laterality: N/A;   ENTEROSCOPY N/A 12/12/2021   Procedure: ENTEROSCOPY;  Surgeon: Sherrilyn Rist, MD;  Location: [facility];  Service: Gastroenterology;  Laterality: N/A;   HEMOSTASIS CLIP PLACEMENT  12/12/2021   Procedure: HEMOSTASIS CLIP PLACEMENT;  Surgeon: Myrtie Neither,  Andreas Blower, MD;  Location: [facility];  Service: Gastroenterology;;   HERNIA REPAIR     rt ing   HOT HEMOSTASIS N/A 12/12/2021   Procedure: HOT HEMOSTASIS (ARGON PLASMA COAGULATION/BICAP);  Surgeon: Sherrilyn Rist, MD;  Location: [facility];  Service: Gastroenterology;  Laterality: N/A;   MASS EXCISION Right 09/01/2013   Procedure: EXCISION AXILLA  MASS;  Surgeon: Clovis Pu. Cornett, MD;  Location: [facility];  Service: General;  Laterality: Right;   TOTAL HIP ARTHROPLASTY Left 12/25/2013   Procedure: LEFT TOTAL HIP ARTHROPLASTY ANTERIOR APPROACH;   Surgeon: Kathryne Hitch, MD;  Location: [facility];  Service: Orthopedics;  Laterality: Left;   TOTAL HIP ARTHROPLASTY Right 09/02/2020   Procedure: RIGHT TOTAL HIP ARTHROPLASTY ANTERIOR APPROACH;  Surgeon: Kathryne Hitch, MD;  Location: [facility];  Service: Orthopedics;  Laterality: Right;    Current Medications: Current Meds  Medication Sig   allopurinol (ZYLOPRIM) 100 MG tablet Take 100 mg by mouth daily.   aspirin EC 81 MG tablet Take 81 mg by mouth daily. Swallow whole.   atenolol (TENORMIN) 25 MG tablet Take 25 mg by mouth every morning.   atorvastatin (LIPITOR) 40 MG tablet Take 1 tablet (40 mg total) by mouth daily.   ferrous sulfate 325 (65 FE) MG tablet Take 1 tablet (325 mg total) by mouth 3 (three) times daily with meals. (Patient taking differently: Take 325 mg by mouth daily at 6 (six) AM.)   Multiple Vitamin (MULTIVITAMIN WITH MINERALS) TABS tablet Take 1 tablet by mouth daily. Centrum Silver   naproxen sodium (ALEVE) 220 MG tablet Take 220 mg by mouth daily as needed (Pain). As needed   vitamin C (ASCORBIC ACID) 500 MG tablet Take 500 mg by mouth daily.     Allergies:   Patient has no known allergies.   Social History   Socioeconomic History   Marital status: Married    Spouse name: Not on file   Number of children: 1   Years of education: Not on file   Highest education level: Not on file  Occupational History   Occupation: Retired     Associate Professor: RETIRED  Tobacco Use   Smoking status: Former    Current packs/day: 0.00    Types: Cigarettes    Quit date: 08/27/1992    Years since quitting: 30.8   Smokeless tobacco: Never  Vaping Use   Vaping status: Never Used  Substance and Sexual Activity   Alcohol use: Yes    Alcohol/week: 0.0 standard drinks of alcohol    Comment: Gin and beer every other day   Drug use: No   Sexual activity: Not on file  Other Topics Concern   Not on file  Social History Narrative   Not on file   Social Determinants of  Health   Financial Resource Strain: Not on file  Food Insecurity: Not on file  Transportation Needs: Not on file  Physical Activity: Not on file  Stress: Not on file  Social Connections: Not on file    Social: From [place]  Family History: The patient's family history is negative for Colon cancer, Esophageal cancer, Rectal cancer, and Stomach cancer.  ROS:   Please see the history of present illness.     EKGs/Labs/Other Studies Reviewed:    The following studies were reviewed today:  LABS LDL: 63 (2023)  RADIOLOGY Cardiac CT: Minimal nonobstructive coronary artery disease (2022) CT scan: Pulmonary artery dilation, right heart dilation (2022)  DIAGNOSTIC Echocardiogram: Low  normal ejection fraction, potential inferior basal hypokinesis, normal RVSP (2020) EKG: Sinus rhythm, no changes from previous EKG (06/19/2023)  EKG:   01/16/21: SR rate 90 WNL  Cardiac Studies & Procedures       ECHOCARDIOGRAM  ECHOCARDIOGRAM COMPLETE 08/07/2021  Narrative ECHOCARDIOGRAM REPORT    Patient Name:   MILLARD BAUTCH  Date of Exam: 08/07/2021 Medical Rec #:  [id number]     Height:       74.0 in Accession #:    [id number]    Weight:       218.0 lb Date of Birth:  08/08/1941      BSA:          2.255 m Patient Age:    80 years      BP:           160/73 mmHg Patient Gender: M             HR:           88 bpm. Exam Location:  [address]  Procedure: 2D Echo, Cardiac Doppler and Color Doppler  Indications:    R06.09 SOB  History:        Patient has no prior history of Echocardiogram examinations. CAD; Risk Factors:Hypertension.  Sonographer:    Clearence Ped RCS Referring Phys: [id number] Tiwana Chavis A Kimia Finan  IMPRESSIONS   1. Inferior basal hypokinesis and abnormal GLS noted in inferior base as well . Left ventricular ejection fraction, by estimation, is 55%. The left ventricle has normal function. The left ventricle demonstrates regional wall motion abnormalities  (see scoring diagram/findings for description). There is mild left ventricular hypertrophy. Left ventricular diastolic parameters were normal. 2. Right ventricular systolic function is normal. The right ventricular size is normal. There is normal pulmonary artery systolic pressure. 3. Left atrial size was moderately dilated. 4. The mitral valve is normal in structure. Trivial mitral valve regurgitation. No evidence of mitral stenosis. 5. The aortic valve was not well visualized. There is mild calcification of the aortic valve. Aortic valve regurgitation is not visualized. Aortic valve sclerosis is present, with no evidence of aortic valve stenosis. 6. The inferior vena cava is normal in size with greater than 50% respiratory variability, suggesting right atrial pressure of 3 mmHg.  FINDINGS Left Ventricle: Inferior basal hypokinesis and abnormal GLS noted in inferior base as well. Left ventricular ejection fraction, by estimation, is 55%. The left ventricle has normal function. The left ventricle demonstrates regional wall motion abnormalities. The left ventricular internal cavity size was normal in size. There is mild left ventricular hypertrophy. Left ventricular diastolic parameters were normal.  Right Ventricle: The right ventricular size is normal. No increase in right ventricular wall thickness. Right ventricular systolic function is normal. There is normal pulmonary artery systolic pressure. The tricuspid regurgitant velocity is 2.33 m/s, and with an assumed right atrial pressure of 3 mmHg, the estimated right ventricular systolic pressure is 24.7 mmHg.  Left Atrium: Left atrial size was moderately dilated.  Right Atrium: Right atrial size was normal in size.  Pericardium: There is no evidence of pericardial effusion.  Mitral Valve: The mitral valve is normal in structure. There is mild thickening of the mitral valve leaflet(s). Trivial mitral valve regurgitation. No evidence of mitral  valve stenosis.  Tricuspid Valve: The tricuspid valve is normal in structure. Tricuspid valve regurgitation is mild . No evidence of tricuspid stenosis.  Aortic Valve: The aortic valve was not well visualized. There is mild calcification of the aortic valve. Aortic valve  regurgitation is not visualized. Aortic valve sclerosis is present, with no evidence of aortic valve stenosis.  Pulmonic Valve: The pulmonic valve was normal in structure. Pulmonic valve regurgitation is not visualized. No evidence of pulmonic stenosis.  Aorta: The aortic root is normal in size and structure.  Venous: The inferior vena cava is normal in size with greater than 50% respiratory variability, suggesting right atrial pressure of 3 mmHg.  IAS/Shunts: No atrial level shunt detected by color flow Doppler.   LEFT VENTRICLE PLAX 2D LVIDd:         4.90 cm   Diastology LVIDs:         3.50 cm   LV e' medial:    6.09 cm/s LV PW:         1.00 cm   LV E/e' medial:  12.1 LV IVS:        1.20 cm   LV e' lateral:   7.51 cm/s LVOT diam:     2.50 cm   LV E/e' lateral: 9.8 LV SV:         93 LV SV Index:   41        2D Longitudinal Strain LVOT Area:     4.91 cm  2D Strain GLS Avg:     -11.5 %   RIGHT VENTRICLE RV Basal diam:  3.60 cm RV S prime:     12.00 cm/s TAPSE (M-mode): 2.4 cm RVSP:           24.7 mmHg  LEFT ATRIUM             Index        RIGHT ATRIUM           Index LA diam:        4.40 cm 1.95 cm/m   RA Pressure: 3.00 mmHg LA Vol (A2C):   51.6 ml 22.89 ml/m  RA Area:     13.80 cm LA Vol (A4C):   33.9 ml 15.04 ml/m  RA Volume:   30.10 ml  13.35 ml/m LA Biplane Vol: 44.1 ml 19.56 ml/m AORTIC VALVE LVOT Vmax:   94.20 cm/s LVOT Vmean:  61.400 cm/s LVOT VTI:    0.189 m  AORTA Ao Root diam: 3.80 cm Ao Asc diam:  3.70 cm  MITRAL VALVE               TRICUSPID VALVE MV Area (PHT):             TR Peak grad:   21.7 mmHg MV Decel Time:             TR Vmax:        233.00 cm/s MV E velocity: 73.40 cm/s   Estimated RAP:  3.00 mmHg MV A velocity: 95.90 cm/s  RVSP:           24.7 mmHg MV E/A ratio:  0.77 SHUNTS Systemic VTI:  0.19 m Systemic Diam: 2.50 cm  Charlton Haws MD Electronically signed by Charlton Haws MD Signature Date/Time: 08/07/2021/10:51:25 AM    Final     CT SCANS  CT CORONARY MORPH W/CTA COR W/SCORE 01/27/2021  Addendum 01/27/2021  2:21 PM ADDENDUM REPORT: 01/27/2021 14:19  CLINICAL DATA:  82 year old with chest pain  EXAM: Cardiac/Coronary  CTA  TECHNIQUE: The patient was scanned on a [employer].  FINDINGS: A 100 kV prospective scan was triggered in the descending thoracic aorta at 111 HU's. Axial non-contrast 3 mm slices were carried out through the heart. The  data set was analyzed on a dedicated work station and scored using the [employer] . Gantry rotation speed was 250 msecs and collimation was .6 mm. No beta blockade and 0.8 mg of sl NTG was given. The 3D data set was reconstructed in 5% intervals of the 67-82 % of the R-R cycle. Diastolic phases were analyzed on a dedicated work station using MPR, MIP and VRT modes. The patient received 80 cc of contrast.  Aorta:  Normal size.  No calcifications.  No dissection.  Aortic Valve:  Trileaflet.  No calcifications.  Coronary Arteries:  Normal coronary origin.  Right dominance.  RCA is a large dominant artery that gives rise to PDA and PLA. There is mid calcified focal plaque, 0-24%.  Left main is a large artery that gives rise to LAD and LCX arteries.  LAD is a large vessel that has calcified plaque proximal and mid 0-24% stenosis.  LCX is a non-dominant artery that gives rise to one large OM1 branch. There is no plaque.  Other findings:  Normal pulmonary vein drainage into the left atrium.  Normal left atrial appendage without a thrombus.  Normal size of the pulmonary artery.  Please see radiology report for non cardiac findings.  IMPRESSION: 1. Coronary calcium score of  564. This was 65 percentile for age and sex matched control.  2. Normal coronary origin with right dominance.  3. LAD and RCA calcified plaque, 0-24%, non flow limiting.   Electronically Signed By: Donato Schultz MD On: 01/27/2021 14:19  Narrative EXAM: OVER-READ INTERPRETATION  CT CHEST  The following report is an over-read performed by radiologist Dr. Charlett Nose of [facility] on 01/27/2021. This over-read does not include interpretation of cardiac or coronary anatomy or pathology. The coronary CTA interpretation by the cardiologist is attached.  COMPARISON:  01/19/2016  FINDINGS: Vascular: Heart is normal size.  Aorta normal caliber.  Mediastinum/Nodes: No adenopathy  Lungs/Pleura: Dependent and bibasilar scarring. No confluent opacities or effusions.  Upper Abdomen: Imaging into the upper abdomen demonstrates no acute findings.  Musculoskeletal: Chest wall soft tissues are unremarkable. No acute bony abnormality.  IMPRESSION: No acute extra cardiac abnormality.  Scarring in the lung bases.  Electronically Signed: By: Charlett Nose M.D. On: 01/27/2021 11:27            Recent Labs: 03/18/2023: ALT 22; BUN 14; Creatinine 1.34; Hemoglobin 13.0; Platelet Count 145; Potassium 4.1; Sodium 141  Recent Lipid Panel    Component Value Date/Time   CHOL 121 04/25/2022 1018   TRIG 106 04/25/2022 1018   HDL 58 04/25/2022 1018   CHOLHDL 2.1 04/25/2022 1018   LDLCALC 44 04/25/2022 1018    Physical Exam:    VS:  BP 110/62   Pulse 95   Ht 6\' 2"  (1.88 m)   Wt 215 lb (97.5 kg)   SpO2 96%   BMI 27.60 kg/m     Wt Readings from Last 3 Encounters:  06/19/23 215 lb (97.5 kg)  03/28/23 215 lb 8 oz (97.8 kg)  11/16/22 215 lb 3.2 oz (97.6 kg)    GEN:  Well nourished, well developed in no acute distress HEENT: Normal NECK: No JVD CARDIAC: RRR, no murmurs, rubs, gallops, No RV heave RESPIRATORY:  Clear to auscultation without rales, wheezing or rhonchi   ABDOMEN: Soft, non-tender, non-distended MUSCULOSKELETAL:  no leg edema, No deformity  SKIN: Warm and dry NEUROLOGIC:  Alert and oriented x 3 PSYCHIATRIC:  Normal affect   ASSESSMENT:    1.  Coronary artery disease involving native coronary artery of native heart without angina pectoris   2. Hypertension, unspecified type   3. Aortic atherosclerosis (HCC)   4. Hyperlipidemia, unspecified hyperlipidemia type   5. MGUS (monoclonal gammopathy of unknown significance)     PLAN:    Coronary Artery Disease HLD - Minimal nonobstructive disease on cardiac CT in 2022. Asymptomatic. LDL controlled at 63 on Lipitor 40mg  daily. -Continue Aspirin 81mg  daily and Lipitor 40mg  daily.  We could trial off BB in the future  Hypertension - Well controlled on Atenolol 25mg  daily. -Continue Atenolol 25mg  daily.  Monoclonal Gammopathy of Unclear Significance (MGUS) Asymptomatic. Discussed potential for cardiac amyloidosis and symptoms to monitor for (orthostatic hypotension, near syncope).  If symptoms; will start with cardiac amyloid screening order set -Monitor for symptoms of cardiac amyloidosis.  Pulmonary Hypertension We reviewed CT Evidence of group 3 pulmonary hypertension. Asymptomatic. NYHA I,  -Monitor for shortness of breath. If symptoms develop, order pulmonary function tests and repeat echo  One year follow up  Medication Adjustments/Labs and Tests Ordered: Current medicines are reviewed at length with the patient today.  Concerns regarding medicines are outlined above.  Orders Placed This Encounter  Procedures   EKG 12-Lead    No orders of the defined types were placed in this encounter.    Patient Instructions  Medication Instructions:  Your physician recommends that you continue on your current medications as directed. Please refer to the Current Medication list given to you today.  *If you need a refill on your cardiac medications before your next appointment, please  call your pharmacy*   Lab Work: NONE If you have labs (blood work) drawn today and your tests are completely normal, you will receive your results only by: MyChart Message (if you have MyChart) OR A paper copy in the mail If you have any lab test that is abnormal or we need to change your treatment, we will call you to review the results.   Testing/Procedures: NONE   Follow-Up: At [facility], you and your health needs are our priority.  As part of our continuing mission to provide you with exceptional heart care, we have created designated Provider Care Teams.  These Care Teams include your primary Cardiologist (physician) and Advanced Practice Providers (APPs -  Physician Assistants and Nurse Practitioners) who all work together to provide you with the care you need, when you need it.  We recommend signing up for the patient portal called "MyChart".  Sign up information is provided on this After Visit Summary.  MyChart is used to connect with patients for Virtual Visits (Telemedicine).  Patients are able to view lab/test results, encounter notes, upcoming appointments, etc.  Non-urgent messages can be sent to your provider as well.   To learn more about what you can do with MyChart, go to [email or web address].    Your next appointment:   1 year(s)  Provider:   Riley Lam, MD       Signed, Christell Constant, MD  06/19/2023 9:38 AM    [facility]

## 2023-06-19 NOTE — Patient Instructions (Signed)
Medication Instructions:  Your physician recommends that you continue on your current medications as directed. Please refer to the Current Medication list given to you today.  *If you need a refill on your cardiac medications before your next appointment, please call your pharmacy*   Lab Work: NONE If you have labs (blood work) drawn today and your tests are completely normal, you will receive your results only by: [place] (if you have MyChart) OR A paper copy in the mail If you have any lab test that is abnormal or we need to change your treatment, we will call you to review the results.   Testing/Procedures: NONE   Follow-Up: At [facility], you and your health needs are our priority.  As part of our continuing mission to provide you with exceptional heart care, we have created designated Provider Care Teams.  These Care Teams include your primary Cardiologist (physician) and Advanced Practice Providers (APPs -  Physician Assistants and Nurse Practitioners) who all work together to provide you with the care you need, when you need it.  We recommend signing up for the patient portal called "MyChart".  Sign up information is provided on this After Visit Summary.  MyChart is used to connect with patients for Virtual Visits (Telemedicine).  Patients are able to view lab/test results, encounter notes, upcoming appointments, etc.  Non-urgent messages can be sent to your provider as well.   To learn more about what you can do with MyChart, go to [email or web address].    Your next appointment:   1 year(s)  Provider:   Rudean Haskell, MD

## 2023-07-30 ENCOUNTER — Inpatient Hospital Stay: Payer: Medicare Other | Attending: Oncology

## 2023-07-30 ENCOUNTER — Inpatient Hospital Stay: Payer: Medicare Other | Admitting: Oncology

## 2023-07-30 VITALS — BP 112/61 | HR 81 | Temp 98.1°F | Resp 18 | Ht 74.0 in | Wt 217.5 lb

## 2023-07-30 DIAGNOSIS — M545 Low back pain, unspecified: Secondary | ICD-10-CM | POA: Diagnosis not present

## 2023-07-30 DIAGNOSIS — D472 Monoclonal gammopathy: Secondary | ICD-10-CM

## 2023-07-30 DIAGNOSIS — D500 Iron deficiency anemia secondary to blood loss (chronic): Secondary | ICD-10-CM

## 2023-07-30 DIAGNOSIS — D509 Iron deficiency anemia, unspecified: Secondary | ICD-10-CM | POA: Insufficient documentation

## 2023-07-30 LAB — CBC WITH DIFFERENTIAL (CANCER CENTER ONLY)
Abs Immature Granulocytes: 0.01 10*3/uL (ref 0.00–0.07)
Basophils Absolute: 0 10*3/uL (ref 0.0–0.1)
Basophils Relative: 1 %
Eosinophils Absolute: 0.2 10*3/uL (ref 0.0–0.5)
Eosinophils Relative: 4 %
HCT: 39.2 % (ref 39.0–52.0)
Hemoglobin: 13.2 g/dL (ref 13.0–17.0)
Immature Granulocytes: 0 %
Lymphocytes Relative: 22 %
Lymphs Abs: 0.9 10*3/uL (ref 0.7–4.0)
MCH: 29.9 pg (ref 26.0–34.0)
MCHC: 33.7 g/dL (ref 30.0–36.0)
MCV: 88.9 fL (ref 80.0–100.0)
Monocytes Absolute: 0.7 10*3/uL (ref 0.1–1.0)
Monocytes Relative: 18 %
Neutro Abs: 2.1 10*3/uL (ref 1.7–7.7)
Neutrophils Relative %: 55 %
Platelet Count: 131 10*3/uL — ABNORMAL LOW (ref 150–400)
RBC: 4.41 MIL/uL (ref 4.22–5.81)
RDW: 14 % (ref 11.5–15.5)
WBC Count: 3.8 10*3/uL — ABNORMAL LOW (ref 4.0–10.5)
nRBC: 0 % (ref 0.0–0.2)

## 2023-07-30 LAB — CMP (CANCER CENTER ONLY)
ALT: 22 U/L (ref 0–44)
AST: 23 U/L (ref 15–41)
Albumin: 4.2 g/dL (ref 3.5–5.0)
Alkaline Phosphatase: 49 U/L (ref 38–126)
Anion gap: 5 (ref 5–15)
BUN: 14 mg/dL (ref 8–23)
CO2: 27 mmol/L (ref 22–32)
Calcium: 8.9 mg/dL (ref 8.9–10.3)
Chloride: 109 mmol/L (ref 98–111)
Creatinine: 1.3 mg/dL — ABNORMAL HIGH (ref 0.61–1.24)
GFR, Estimated: 56 mL/min — ABNORMAL LOW (ref >60)
Glucose, Bld: 109 mg/dL — ABNORMAL HIGH (ref 70–99)
Potassium: 4.2 mmol/L (ref 3.5–5.1)
Sodium: 141 mmol/L (ref 135–145)
Total Bilirubin: 0.6 mg/dL (ref <1.2)
Total Protein: 7.6 g/dL (ref 6.5–8.1)

## 2023-07-30 LAB — FERRITIN: Ferritin: 30 ng/mL (ref 24–336)

## 2023-07-30 NOTE — Progress Notes (Signed)
  [facility] OFFICE PROGRESS NOTE   Diagnosis: Iron deficiency anemia, monoclonal protein  INTERVAL HISTORY:   [name] returns as scheduled.  He feels well.  He has lower back pain in the mornings.  This improves with activity.  No bleeding.  No other complaint.  Objective:  Vital signs in last 24 hours:  Blood pressure 112/61, pulse 81, temperature 98.1 F (36.7 C), temperature source Temporal, resp. rate 18, height 6\' 2"  (1.88 m), weight 217 lb 8 oz (98.7 kg), SpO2 98%.  Lymphatics: No cervical, supraclavicular, axillary, or inguinal nodes Resp: End inspiratory rhonchi at the lower posterior chest bilaterally, no respiratory distress Cardio: Regular rate and rhythm GI: No hepatosplenomegaly Vascular: No leg edema   Lab Results:  Lab Results  Component Value Date   WBC 3.8 (L) 07/30/2023   HGB 13.2 07/30/2023   HCT 39.2 07/30/2023   MCV 88.9 07/30/2023   PLT PENDING 07/30/2023   NEUTROABS 2.1 07/30/2023    CMP  Lab Results  Component Value Date   NA 141 07/30/2023   K 4.2 07/30/2023   CL 109 07/30/2023   CO2 27 07/30/2023   GLUCOSE 109 (H) 07/30/2023   BUN 14 07/30/2023   CREATININE 1.30 (H) 07/30/2023   CALCIUM 8.9 07/30/2023   PROT 7.6 07/30/2023   ALBUMIN 4.2 07/30/2023   AST 23 07/30/2023   ALT 22 07/30/2023   ALKPHOS 49 07/30/2023   BILITOT 0.6 07/30/2023   GFRNONAA 56 (L) 07/30/2023   GFRAA 69 07/08/2015   Ferritin: 66 on 07/22/2023   Medications: I have reviewed the patient's current medications.   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 History of a serum IgG lambda monoclonal protein 11/27/2021 SPEP-no M spike observed, immunofixation shows IgG monoclonal protein with lambda light chain specificity 03/28/2023 SPEP-no M spike observed, normal immunoglobulins, immunofixation pattern unremarkable, no evidence of monoclonal protein, stable serum light chains Excision of a benign reactive Lex axillary lymph node 09/01/2013 Sickle cell trait Hypertension Gout Hyperlipidemia Status post bilateral hip replacement   Disposition: [name] appears stable.  The ferritin was normal via Dr. Wylene Simmer last week.  We will follow-up on the ferritin level from today.  He is not anemic.  He has mild leukopenia and thrombocytopenia.  There is a likely benign findings.  There is no clinical evidence for myeloma or another myeloproliferative disorder.  He will return for an office visit and myeloma panel in 8 months.  Thornton Papas, MD  07/30/2023  11:28 AM

## 2024-03-30 ENCOUNTER — Inpatient Hospital Stay: Payer: Self-pay

## 2024-03-30 ENCOUNTER — Other Ambulatory Visit: Payer: Medicare Other

## 2024-03-30 ENCOUNTER — Inpatient Hospital Stay: Payer: Medicare Other | Attending: Oncology | Admitting: Oncology

## 2024-03-30 VITALS — BP 128/65 | HR 85 | Temp 98.4°F | Resp 18 | Ht 74.0 in | Wt 206.8 lb

## 2024-03-30 DIAGNOSIS — R768 Other specified abnormal immunological findings in serum: Secondary | ICD-10-CM | POA: Insufficient documentation

## 2024-03-30 DIAGNOSIS — D472 Monoclonal gammopathy: Secondary | ICD-10-CM | POA: Diagnosis not present

## 2024-03-30 DIAGNOSIS — Z862 Personal history of diseases of the blood and blood-forming organs and certain disorders involving the immune mechanism: Secondary | ICD-10-CM | POA: Insufficient documentation

## 2024-03-30 DIAGNOSIS — D500 Iron deficiency anemia secondary to blood loss (chronic): Secondary | ICD-10-CM

## 2024-03-30 LAB — CMP (CANCER CENTER ONLY)
ALT: 32 U/L (ref 0–44)
AST: 39 U/L (ref 15–41)
Albumin: 4.2 g/dL (ref 3.5–5.0)
Alkaline Phosphatase: 58 U/L (ref 38–126)
Anion gap: 11 (ref 5–15)
BUN: 16 mg/dL (ref 8–23)
CO2: 23 mmol/L (ref 22–32)
Calcium: 9.2 mg/dL (ref 8.9–10.3)
Chloride: 107 mmol/L (ref 98–111)
Creatinine: 1.32 mg/dL — ABNORMAL HIGH (ref 0.61–1.24)
GFR, Estimated: 54 mL/min — ABNORMAL LOW (ref >60)
Glucose, Bld: 130 mg/dL — ABNORMAL HIGH (ref 70–99)
Potassium: 4.1 mmol/L (ref 3.5–5.1)
Sodium: 141 mmol/L (ref 135–145)
Total Bilirubin: 0.6 mg/dL (ref 0.0–1.2)
Total Protein: 7.7 g/dL (ref 6.5–8.1)

## 2024-03-30 LAB — CBC WITH DIFFERENTIAL (CANCER CENTER ONLY)
Abs Immature Granulocytes: 0.02 K/uL (ref 0.00–0.07)
Basophils Absolute: 0 K/uL (ref 0.0–0.1)
Basophils Relative: 1 %
Eosinophils Absolute: 0.2 K/uL (ref 0.0–0.5)
Eosinophils Relative: 4 %
HCT: 38.8 % — ABNORMAL LOW (ref 39.0–52.0)
Hemoglobin: 13.2 g/dL (ref 13.0–17.0)
Immature Granulocytes: 1 %
Lymphocytes Relative: 23 %
Lymphs Abs: 1 K/uL (ref 0.7–4.0)
MCH: 30.6 pg (ref 26.0–34.0)
MCHC: 34 g/dL (ref 30.0–36.0)
MCV: 90 fL (ref 80.0–100.0)
Monocytes Absolute: 0.6 K/uL (ref 0.1–1.0)
Monocytes Relative: 14 %
Neutro Abs: 2.5 K/uL (ref 1.7–7.7)
Neutrophils Relative %: 57 %
Platelet Count: 141 K/uL — ABNORMAL LOW (ref 150–400)
RBC: 4.31 MIL/uL (ref 4.22–5.81)
RDW: 13.4 % (ref 11.5–15.5)
WBC Count: 4.3 K/uL (ref 4.0–10.5)
nRBC: 0 % (ref 0.0–0.2)

## 2024-03-30 LAB — FERRITIN: Ferritin: 74 ng/mL (ref 24–336)

## 2024-03-30 NOTE — Progress Notes (Signed)
  [facility] OFFICE PROGRESS NOTE   Diagnosis: History of iron  deficiency, serum monoclonal protein  INTERVAL HISTORY:   Mr. [name] returns as scheduled.  He feels well.  Good appetite.  No fever or night sweats.  No complaint.  No difficulty with bowel function.  No bleeding.  Objective:  Vital signs in last 24 hours:  Blood pressure 128/65, pulse 85, temperature 98.4 F (36.9 C), temperature source Oral, resp. rate 18, height 6' 2 (1.88 m), weight 206 lb 12.8 oz (93.8 kg), SpO2 98%.    Lymphatics: No cervical, supraclavicular, axillary, or inguinal nodes Resp: Lungs clear bilaterally Cardio: Regular and rhythm GI: No hepatosplenomegaly Vascular: No leg edema   Lab Results:  Lab Results  Component Value Date   WBC 4.3 03/30/2024   HGB 13.2 03/30/2024   HCT 38.8 (L) 03/30/2024   MCV 90.0 03/30/2024   PLT 141 (L) 03/30/2024   NEUTROABS 2.5 03/30/2024    CMP  Lab Results  Component Value Date   NA 141 03/30/2024   K 4.1 03/30/2024   CL 107 03/30/2024   CO2 23 03/30/2024   GLUCOSE 130 (H) 03/30/2024   BUN 16 03/30/2024   CREATININE 1.32 (H) 03/30/2024   CALCIUM  9.2 03/30/2024   PROT 7.7 03/30/2024   ALBUMIN 4.2 03/30/2024   AST 39 03/30/2024   ALT 32 03/30/2024   ALKPHOS 58 03/30/2024   BILITOT 0.6 03/30/2024   GFRNONAA 54 (L) 03/30/2024   GFRAA 69 07/08/2015     Medications: I have reviewed the patient's current medications.   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 History of a serum  IgG lambda monoclonal protein 11/27/2021 SPEP-no M spike observed, immunofixation shows IgG monoclonal protein with lambda light chain specificity 03/18/2023 SPEP-no M spike observed, normal immunoglobulins, immunofixation pattern unremarkable, no evidence of monoclonal protein, stable serum light chains Excision of a benign reactive right axillary lymph node 09/01/2013 Sickle cell trait Hypertension Gout Hyperlipidemia Status post bilateral hip replacement    Disposition: Mr. [name] appears stable.  There is no clinical evidence for progression to multiple myeloma or another lymphoproliferative disorder.  We will follow-up on the myeloma panel from today.  He will return for an office and lab visit in 1 year. Mr. [name] has a history of iron  deficiency.  The ferritin level and hemoglobin are normal today.  He will remain up-to-date on influenza and pneumonia vaccines.  Ruben Hof, MD  03/30/2024  12:35 PM

## 2024-03-31 LAB — KAPPA/LAMBDA LIGHT CHAINS
Kappa free light chain: 42.1 mg/L — ABNORMAL HIGH (ref 3.3–19.4)
Kappa, lambda light chain ratio: 0.65 (ref 0.26–1.65)
Lambda free light chains: 64.6 mg/L — ABNORMAL HIGH (ref 5.7–26.3)

## 2024-04-02 ENCOUNTER — Ambulatory Visit: Payer: Self-pay | Admitting: Oncology

## 2024-04-02 LAB — MULTIPLE MYELOMA PANEL, SERUM
Albumin SerPl Elph-Mcnc: 3.7 g/dL (ref 2.9–4.4)
Albumin/Glob SerPl: 1.1 (ref 0.7–1.7)
Alpha 1: 0.2 g/dL (ref 0.0–0.4)
Alpha2 Glob SerPl Elph-Mcnc: 0.6 g/dL (ref 0.4–1.0)
B-Globulin SerPl Elph-Mcnc: 1.2 g/dL (ref 0.7–1.3)
Gamma Glob SerPl Elph-Mcnc: 1.6 g/dL (ref 0.4–1.8)
Globulin, Total: 3.5 g/dL (ref 2.2–3.9)
IgA: 347 mg/dL (ref 61–437)
IgG (Immunoglobin G), Serum: 1587 mg/dL (ref 603–1613)
IgM (Immunoglobulin M), Srm: 50 mg/dL (ref 15–143)
Total Protein ELP: 7.2 g/dL (ref 6.0–8.5)

## 2024-04-03 ENCOUNTER — Encounter: Payer: Self-pay | Admitting: Oncology

## 2024-04-03 NOTE — Telephone Encounter (Signed)
-----   Message from Arley Hof sent at 04/02/2024  2:21 PM EDT ----- Please call patient, labs are stable, no evidence for progression to multiple myeloma, follow-up as scheduled  ----- Message ----- From: Rebecka, Lab [facility] [place] Sent: 03/30/2024  11:51 AM EDT To: Arley KATHEE Hof, MD

## 2024-04-03 NOTE — Telephone Encounter (Signed)
Patient gave verbal understanding and had no further question or concern. 

## 2024-10-04 NOTE — Progress Notes (Unsigned)
 " Cardiology Office Note:    Date:  10/06/2024   ID:  [name], DOB 1941-07-11, MRN [id number]  PCP:  Vernadine Charlie ORN, MD   [facility] Providers Cardiologist:  Stanly DELENA Leavens, MD     Referring MD: Vernadine Charlie ORN, MD   Chief complaint: Annual follow up     History of Present Illness:   [name] is a 84 y.o. male with a hx of MGUS, sickle cell trait, aortic atherosclerosis and CAC, HTN presenting today for annual follow-up of cardiovascular conditions.  Moved here from [place]  years ago.  Was noted that he saw cardiologist in 2006, had a normal stress test at that time, was not noted to not understand why he was following with a cardiologist per Dr. Milan early notes in 2022.  Has been asymptomatic over the years.  Reported some DOE with mowing the yard, negative BNP.  Echo November 2022 showing inferior basal hypokinesis and abnormal GLS and inferior base.  LVEF 55% with RWMA (see echo).  Mild LVH, normal diastolic parameters.  LA moderately dilated.  No significant valvular disease.   Most recent follow-up with cardiology was October 2024.  In the interim between visits, patient had a negative cardiac CT with only minimal nonobstructive CAD, as well as evidence of group 3 pulmonary hypertension, was noted to be asymptomatic at this visit.  Given history of MGUS, the potential for cardiac amyloidosis was discussed, instructed to continue monitoring for new signs or symptoms.  Presents independently, doing well from a cardiovascular standpoint.  Reports the DOE from prior visits resolved following additional daily iron  supplement.  Now only notices minor DOE if he has to physically exert himself with moderate intensity activities, like walking the trash up and down the steep hill of his driveway, unchanged from his baseline.  He reports he is relatively inactive, occasionally uses the stationary bike but not often.  He denies chest pain, SOB,  orthopnea, PND, palpitations, dizziness, lightheadedness, near-syncope, dark/tarry/bloody stools, hematuria, weight changes, edema.  Is able to care for himself independently, performs yard work without cardiovascular symptoms in warmer weather.  ROS:   Please see the history of present illness.     All other systems reviewed and are negative.     Past Medical History:  Diagnosis Date   Abnormal CT of the chest 04/16/13    Anemia    sees dr Zulma [facility]  DEFICIENCY ANEMIA FROM INTERMITTENT GI BLOOD LOSS RELATED TO SMALL BOWEL AVMS   Avascular necrosis (HCC)    LEFT HIP - PT STATES HE WALKS WITH LIMP AND PAIN AT TIMES   AVM (arteriovenous malformation) of colon    HX OF GI BLOOD LOSS RELATED TO SMALL BOWEL AVM'S   GERD (gastroesophageal reflux disease)    Gout    Hyperlipemia    Hypertension    IgG lambda monoclonal gammopathy    OF UNDETERMINED SIGNIFICANCE - FOLLOWED BY DR. FREDDIE   Lumbar pain    HX O PAIN LEFT LEG AND XRAYS BACK IN 2005 OR 2006 AND TOLD DISC RUBBING ON NERVE - GIVEN INJECTIONS LOWER BACK - DIDN'T SOLVE THE PAIN DOWN LEFT LEG   Lymphadenopathy, axillary 04/07/2013   3 cm right axillary node S/P BIOPSY - NOT MALIGNANT   MGUS (monoclonal gammopathy of unknown significance) 01/10/2016   Sickle cell trait    Wears glasses    Wears partial dentures    top and bottom partials    Past Surgical History:  Procedure Laterality  Date   APPENDECTOMY     COLONOSCOPY     ENTEROSCOPY  08/24/2011   Procedure: ENTEROSCOPY;  Surgeon: Lamar JONETTA Aho, MD;  Location: [facility];  Service: Endoscopy;  Laterality: N/A;   ENTEROSCOPY N/A 12/12/2021   Procedure: ENTEROSCOPY;  Surgeon: Legrand Victory LITTIE DOUGLAS, MD;  Location: [facility];  Service: Gastroenterology;  Laterality: N/A;   HEMOSTASIS CLIP PLACEMENT  12/12/2021   Procedure: HEMOSTASIS CLIP PLACEMENT;  Surgeon: Legrand Victory LITTIE DOUGLAS, MD;  Location: [facility];  Service: Gastroenterology;;   HERNIA REPAIR     rt  ing   HOT HEMOSTASIS N/A 12/12/2021   Procedure: HOT HEMOSTASIS (ARGON PLASMA COAGULATION/BICAP);  Surgeon: Legrand Victory LITTIE DOUGLAS, MD;  Location: [facility];  Service: Gastroenterology;  Laterality: N/A;   MASS EXCISION Right 09/01/2013   Procedure: EXCISION AXILLA  MASS;  Surgeon: Debby LABOR. Cornett, MD;  Location: [facility];  Service: General;  Laterality: Right;   TOTAL HIP ARTHROPLASTY Left 12/25/2013   Procedure: LEFT TOTAL HIP ARTHROPLASTY ANTERIOR APPROACH;  Surgeon: Lonni CINDERELLA Poli, MD;  Location: [facility];  Service: Orthopedics;  Laterality: Left;   TOTAL HIP ARTHROPLASTY Right 09/02/2020   Procedure: RIGHT TOTAL HIP ARTHROPLASTY ANTERIOR APPROACH;  Surgeon: Poli Lonni CINDERELLA, MD;  Location: [facility];  Service: Orthopedics;  Laterality: Right;    Current Medications: Active Medications[1]   Allergies:   Patient has no known allergies.   Social History   Socioeconomic History   Marital status: Married    Spouse name: Not on file   Number of children: 1   Years of education: Not on file   Highest education level: Not on file  Occupational History   Occupation: Retired     Associate Professor: RETIRED  Tobacco Use   Smoking status: Former    Current packs/day: 0.00    Types: Cigarettes    Quit date: 08/27/1992    Years since quitting: 32.1   Smokeless tobacco: Never  Vaping Use   Vaping status: Never Used  Substance and Sexual Activity   Alcohol use: Yes    Alcohol/week: 0.0 standard drinks of alcohol    Comment: Gin and beer every other day   Drug use: No   Sexual activity: Not on file  Other Topics Concern   Not on file  Social History Narrative   Not on file   Social Drivers of Health   Tobacco Use: Medium Risk (10/06/2024)   Patient History    Smoking Tobacco Use: Former    Smokeless Tobacco Use: Never    Passive Exposure: Not on [place]uary Strain: Not on file  Food Insecurity: Not on file  Transportation Needs: Not on file   Physical Activity: Not on file  Stress: Not on file  Social Connections: Not on file  Depression (PHQ2-9): Low Risk (03/30/2024)   Depression (PHQ2-9)    PHQ-2 Score: 0  Alcohol Screen: Not on file  Housing: Not on file  Utilities: Not on file  Health Literacy: Not on file     Family History: The patient's family history is negative for Colon cancer, Esophageal cancer, Rectal cancer, and Stomach cancer.  EKGs/Labs/Other Studies Reviewed:    The following studies were reviewed today:  EKG Interpretation Date/Time:  Tuesday October 06 2024 11:05:20 EST Ventricular Rate:  91 PR Interval:  130 QRS Duration:  86 QT Interval:  350 QTC Calculation: 430 R Axis:   30  Text Interpretation: Normal sinus rhythm Normal ECG When compared with ECG  of 19-Jun-2023 09:12, No significant change was found Confirmed by Yitta Gongaware [phone]) on 10/06/2024 11:10:38 AM    Recent Labs: 03/30/2024: ALT 32; BUN 16; Creatinine 1.32; Hemoglobin 13.2; Platelet Count 141; Potassium 4.1; Sodium 141  Recent Lipid Panel    Component Value Date/Time   CHOL 121 04/25/2022 1018   TRIG 106 04/25/2022 1018   HDL 58 04/25/2022 1018   CHOLHDL 2.1 04/25/2022 1018   LDLCALC 44 04/25/2022 1018     Risk Assessment/Calculations:                Physical Exam:    VS:  BP 128/60 (BP Location: Left Arm, Patient Position: Sitting, Cuff Size: Normal)   Pulse 90   Ht 6' 2 (1.88 m)   Wt 214 lb (97.1 kg)   SpO2 97%   BMI 27.48 kg/m        Wt Readings from Last 3 Encounters:  10/06/24 214 lb (97.1 kg)  03/30/24 206 lb 12.8 oz (93.8 kg)  07/30/23 217 lb 8 oz (98.7 kg)     GEN:  Well nourished, well developed in no acute distress HEENT: Normal NECK:  No carotid bruits CARDIAC:  S1-S2 normal, RRR, no murmurs, rubs, gallops RESPIRATORY:  Clear to auscultation without rales, wheezing or rhonchi  MUSCULOSKELETAL:  No edema; No deformity  SKIN: Warm and dry NEUROLOGIC:  Alert and oriented x  3 PSYCHIATRIC:  Normal affect       Assessment & Plan Coronary artery disease involving native coronary artery of native heart without angina pectoris Coronary CTA 01/27/2021: CAC score 564, 81st percentile.  Calcified plaque in LAD and RCA measuring 0-24%, nonflow limiting. EKG: NSR 91 bpm Denies CP, SOB, palpitations, near-syncope Continue to monitor for new symptoms, notify office if they occur Proceed to the ED with any significant change in symptoms. Continue aspirin  81 mg daily, atorvastatin  40 mg daily, atenolol  25 mg daily DOE (dyspnea on exertion) Echo 08/07/2021: LVEF 55%, RWMA present (see echo), mild LVH, normal diastolic parameters, LA moderately dilated, no significant valvular disease. Reports his symptoms of DOE mostly resolved, significantly improved following administration of iron  supplement, which he is continuing into today.  Most recent ferritin levels measured with heme-onc in July 2025.  Continue follow-up with heme-onc for further monitoring/ongoing iron  supplementation Primary hypertension BPs reported well-controlled at home Continue atenolol  25 mg daily Hyperlipidemia, unspecified hyperlipidemia type LDL goal <70 Lipid panel performed at PCP office with goal for medical in October: LDL 69 Continue atorvastatin  40 mg daily Pulmonary hypertension, unspecified (HCC) Per Dr. Milan note, patient has some degree of pulmonary artery dilatation suggesting a degree of pulmonary hypertension, with mild RV dilatation in comparison of his left ventricle. No repeat echo needed at this time given patient asymptomatic Continue to monitor for new symptoms of shortness of breath, notify the office if these occur   Disposition: Follow-up in 1 year, or sooner if necessary.  Proceed to the ED with any new or worsening symptoms.            Medication Adjustments/Labs and Tests Ordered: Current medicines are reviewed at length with the patient today.  Concerns  regarding medicines are outlined above.  Orders Placed This Encounter  Procedures   EKG 12-Lead   No orders of the defined types were placed in this encounter.   Patient Instructions  Medication Instructions:  Your physician recommends that you continue on your current medications as directed. Please refer to the Current Medication list given to you today.  *  If you need a refill on your cardiac medications before your next appointment, please call your pharmacy*  Lab Work: NONE ordered at this time of appointment   Testing/Procedures: NONE ordered at this time of appointment   Follow-Up: At [facility], you and your health needs are our priority.  As part of our continuing mission to provide you with exceptional heart care, our providers are all part of one team.  This team includes your primary Cardiologist (physician) and Advanced Practice Providers or APPs (Physician Assistants and Nurse Practitioners) who all work together to provide you with the care you need, when you need it.  Your next appointment:   1 year(s)  Provider:   Stanly DELENA Leavens, MD    We recommend signing up for the patient portal called MyChart.  Sign up information is provided on this After Visit Summary.  MyChart is used to connect with patients for Virtual Visits (Telemedicine).  Patients are able to view lab/test results, encounter notes, upcoming appointments, etc.  Non-urgent messages can be sent to your provider as well.   To learn more about what you can do with MyChart, go to [email or web address].               Signed, Montgomery Rothlisberger E Gilberto Stanforth, NP  10/06/2024 11:37 AM    [facility]     [1]  Current Meds  Medication Sig   allopurinol  (ZYLOPRIM ) 100 MG tablet Take 100 mg by mouth daily.   aspirin  EC 81 MG tablet Take 81 mg by mouth daily. Swallow whole.   atenolol  (TENORMIN ) 25 MG tablet Take 25 mg by mouth every morning.   atorvastatin  (LIPITOR) 40 MG tablet Take  1 tablet (40 mg total) by mouth daily.   ferrous sulfate  325 (65 FE) MG tablet Take 1 tablet (325 mg total) by mouth 3 (three) times daily with meals. (Patient taking differently: Take 325 mg by mouth daily with breakfast.)   Multiple Vitamin (MULTIVITAMIN WITH MINERALS) TABS tablet Take 1 tablet by mouth daily. Centrum Silver   naproxen  sodium (ALEVE ) 220 MG tablet Take 220 mg by mouth daily as needed (Pain). As needed   vitamin C (ASCORBIC ACID ) 500 MG tablet Take 500 mg by mouth daily.   "

## 2024-10-06 ENCOUNTER — Ambulatory Visit: Attending: Emergency Medicine | Admitting: Emergency Medicine

## 2024-10-06 ENCOUNTER — Encounter: Payer: Self-pay | Admitting: Emergency Medicine

## 2024-10-06 VITALS — BP 128/60 | HR 90 | Ht 74.0 in | Wt 214.0 lb

## 2024-10-06 DIAGNOSIS — I272 Pulmonary hypertension, unspecified: Secondary | ICD-10-CM

## 2024-10-06 DIAGNOSIS — I10 Essential (primary) hypertension: Secondary | ICD-10-CM

## 2024-10-06 DIAGNOSIS — E785 Hyperlipidemia, unspecified: Secondary | ICD-10-CM | POA: Diagnosis not present

## 2024-10-06 DIAGNOSIS — I2510 Atherosclerotic heart disease of native coronary artery without angina pectoris: Secondary | ICD-10-CM | POA: Diagnosis not present

## 2024-10-06 DIAGNOSIS — R0609 Other forms of dyspnea: Secondary | ICD-10-CM | POA: Diagnosis not present

## 2024-10-06 NOTE — Patient Instructions (Signed)
 Medication Instructions:  Your physician recommends that you continue on your current medications as directed. Please refer to the Current Medication list given to you today.  *If you need a refill on your cardiac medications before your next appointment, please call your pharmacy*  Lab Work: NONE ordered at this time of appointment   Testing/Procedures: NONE ordered at this time of appointment    Follow-Up: At [facility], you and your health needs are our priority.  As part of our continuing mission to provide you with exceptional heart care, our providers are all part of one team.  This team includes your primary Cardiologist (physician) and Advanced Practice Providers or APPs (Physician Assistants and Nurse Practitioners) who all work together to provide you with the care you need, when you need it.  Your next appointment:   1 year(s)  Provider:   Jann Melody, MD    We recommend signing up for the patient portal called "MyChart".  Sign up information is provided on this After Visit Summary.  MyChart is used to connect with patients for Virtual Visits (Telemedicine).  Patients are able to view lab/test results, encounter notes, upcoming appointments, etc.  Non-urgent messages can be sent to your provider as well.   To learn more about what you can do with MyChart, go to [email or web address].

## 2024-10-06 NOTE — Assessment & Plan Note (Signed)
 Echo 08/07/2021: LVEF 55%, RWMA present (see echo), mild LVH, normal diastolic parameters, LA moderately dilated, no significant valvular disease. Reports his symptoms of DOE mostly resolved, significantly improved following administration of iron  supplement, which he is continuing into today.  Most recent ferritin levels measured with heme-onc in July 2025.  Continue follow-up with heme-onc for further monitoring/ongoing iron  supplementation

## 2024-10-06 NOTE — Assessment & Plan Note (Signed)
 BPs reported well-controlled at home Continue atenolol  25 mg daily

## 2024-10-06 NOTE — Assessment & Plan Note (Signed)
 Coronary CTA 01/27/2021: CAC score 564, 81st percentile.  Calcified plaque in LAD and RCA measuring 0-24%, nonflow limiting. EKG: NSR 91 bpm Denies CP, SOB, palpitations, near-syncope Continue to monitor for new symptoms, notify office if they occur Proceed to the ED with any significant change in symptoms. Continue aspirin  81 mg daily, atorvastatin  40 mg daily, atenolol  25 mg daily

## 2025-03-29 ENCOUNTER — Ambulatory Visit: Admitting: Oncology
